# Patient Record
Sex: Female | Born: 1945 | Race: White | Hispanic: No | Marital: Married | Smoking: Former smoker
Health system: Southern US, Community
[De-identification: ages and names within clinical notes are randomized; demographics above are authoritative.]

## PROBLEM LIST (undated history)

## (undated) DIAGNOSIS — R911 Solitary pulmonary nodule: Secondary | ICD-10-CM

## (undated) DIAGNOSIS — H269 Unspecified cataract: Secondary | ICD-10-CM

## (undated) DIAGNOSIS — T7840XA Allergy, unspecified, initial encounter: Secondary | ICD-10-CM

## (undated) DIAGNOSIS — J479 Bronchiectasis, uncomplicated: Secondary | ICD-10-CM

## (undated) DIAGNOSIS — C801 Malignant (primary) neoplasm, unspecified: Secondary | ICD-10-CM

## (undated) DIAGNOSIS — T4145XA Adverse effect of unspecified anesthetic, initial encounter: Secondary | ICD-10-CM

## (undated) DIAGNOSIS — M1990 Unspecified osteoarthritis, unspecified site: Secondary | ICD-10-CM

## (undated) DIAGNOSIS — K649 Unspecified hemorrhoids: Secondary | ICD-10-CM

## (undated) DIAGNOSIS — M75102 Unspecified rotator cuff tear or rupture of left shoulder, not specified as traumatic: Secondary | ICD-10-CM

## (undated) DIAGNOSIS — T8859XA Other complications of anesthesia, initial encounter: Secondary | ICD-10-CM

## (undated) DIAGNOSIS — J189 Pneumonia, unspecified organism: Secondary | ICD-10-CM

## (undated) DIAGNOSIS — D649 Anemia, unspecified: Secondary | ICD-10-CM

## (undated) DIAGNOSIS — G4730 Sleep apnea, unspecified: Secondary | ICD-10-CM

## (undated) DIAGNOSIS — R112 Nausea with vomiting, unspecified: Secondary | ICD-10-CM

## (undated) DIAGNOSIS — Z9889 Other specified postprocedural states: Secondary | ICD-10-CM

## (undated) DIAGNOSIS — I471 Supraventricular tachycardia, unspecified: Secondary | ICD-10-CM

## (undated) DIAGNOSIS — G4733 Obstructive sleep apnea (adult) (pediatric): Secondary | ICD-10-CM

## (undated) DIAGNOSIS — H409 Unspecified glaucoma: Secondary | ICD-10-CM

## (undated) DIAGNOSIS — E7800 Pure hypercholesterolemia, unspecified: Secondary | ICD-10-CM

## (undated) DIAGNOSIS — IMO0001 Reserved for inherently not codable concepts without codable children: Secondary | ICD-10-CM

## (undated) DIAGNOSIS — I720 Aneurysm of carotid artery: Secondary | ICD-10-CM

## (undated) DIAGNOSIS — Z9989 Dependence on other enabling machines and devices: Secondary | ICD-10-CM

## (undated) HISTORY — DX: Other specified postprocedural states: Z98.890

## (undated) HISTORY — DX: Allergy, unspecified, initial encounter: T78.40XA

## (undated) HISTORY — DX: Obstructive sleep apnea (adult) (pediatric): G47.33

## (undated) HISTORY — DX: Dependence on other enabling machines and devices: Z99.89

## (undated) HISTORY — DX: Unspecified glaucoma: H40.9

## (undated) HISTORY — PX: EYE SURGERY: SHX253

## (undated) HISTORY — PX: BUNIONECTOMY: SHX129

## (undated) HISTORY — DX: Pure hypercholesterolemia, unspecified: E78.00

## (undated) HISTORY — DX: Unspecified cataract: H26.9

## (undated) HISTORY — PX: NASAL SEPTUM SURGERY: SHX37

## (undated) HISTORY — DX: Reserved for inherently not codable concepts without codable children: IMO0001

## (undated) HISTORY — DX: Unspecified hemorrhoids: K64.9

## (undated) HISTORY — PX: NASAL SINUS SURGERY: SHX719

## (undated) HISTORY — DX: Sleep apnea, unspecified: G47.30

## (undated) HISTORY — DX: Malignant (primary) neoplasm, unspecified: C80.1

## (undated) HISTORY — PX: OTHER SURGICAL HISTORY: SHX169

---

## 1980-01-17 HISTORY — PX: TUBAL LIGATION: SHX77

## 2000-08-10 ENCOUNTER — Other Ambulatory Visit: Admission: RE | Admit: 2000-08-10 | Discharge: 2000-08-10 | Payer: Self-pay | Admitting: Obstetrics & Gynecology

## 2001-01-16 DIAGNOSIS — Z9889 Other specified postprocedural states: Secondary | ICD-10-CM

## 2001-01-16 HISTORY — DX: Other specified postprocedural states: Z98.890

## 2001-08-30 ENCOUNTER — Other Ambulatory Visit: Admission: RE | Admit: 2001-08-30 | Discharge: 2001-08-30 | Payer: Self-pay | Admitting: Obstetrics & Gynecology

## 2002-08-27 ENCOUNTER — Other Ambulatory Visit: Admission: RE | Admit: 2002-08-27 | Discharge: 2002-08-27 | Payer: Self-pay | Admitting: Obstetrics & Gynecology

## 2003-09-15 ENCOUNTER — Other Ambulatory Visit: Admission: RE | Admit: 2003-09-15 | Discharge: 2003-09-15 | Payer: Self-pay | Admitting: Obstetrics & Gynecology

## 2004-09-16 ENCOUNTER — Other Ambulatory Visit: Admission: RE | Admit: 2004-09-16 | Discharge: 2004-09-16 | Payer: Self-pay | Admitting: Obstetrics & Gynecology

## 2005-01-16 DIAGNOSIS — G4733 Obstructive sleep apnea (adult) (pediatric): Secondary | ICD-10-CM

## 2005-01-16 HISTORY — DX: Obstructive sleep apnea (adult) (pediatric): G47.33

## 2006-01-16 DIAGNOSIS — IMO0001 Reserved for inherently not codable concepts without codable children: Secondary | ICD-10-CM

## 2006-01-16 HISTORY — DX: Reserved for inherently not codable concepts without codable children: IMO0001

## 2007-10-15 ENCOUNTER — Ambulatory Visit: Payer: Self-pay | Admitting: Internal Medicine

## 2008-04-21 ENCOUNTER — Ambulatory Visit: Payer: Self-pay | Admitting: Internal Medicine

## 2009-01-16 LAB — HM PAP SMEAR: HM Pap smear: NORMAL

## 2009-11-16 LAB — HM COLONOSCOPY

## 2010-01-16 HISTORY — PX: COLONOSCOPY: SHX174

## 2010-10-19 ENCOUNTER — Other Ambulatory Visit: Payer: Self-pay

## 2010-10-25 ENCOUNTER — Encounter: Payer: Self-pay | Admitting: Internal Medicine

## 2010-10-26 ENCOUNTER — Ambulatory Visit (INDEPENDENT_AMBULATORY_CARE_PROVIDER_SITE_OTHER): Payer: BC Managed Care – PPO | Admitting: Internal Medicine

## 2010-10-26 ENCOUNTER — Encounter: Payer: Self-pay | Admitting: Internal Medicine

## 2010-10-26 DIAGNOSIS — R5383 Other fatigue: Secondary | ICD-10-CM

## 2010-10-26 DIAGNOSIS — Z23 Encounter for immunization: Secondary | ICD-10-CM

## 2010-10-26 DIAGNOSIS — Z13220 Encounter for screening for lipoid disorders: Secondary | ICD-10-CM

## 2010-10-26 DIAGNOSIS — R5381 Other malaise: Secondary | ICD-10-CM

## 2010-10-26 LAB — CBC WITH DIFFERENTIAL/PLATELET
Eosinophils Relative: 2.8 % (ref 0.0–5.0)
HCT: 40.5 % (ref 36.0–46.0)
Hemoglobin: 13.5 g/dL (ref 12.0–15.0)
Lymphs Abs: 1.6 10*3/uL (ref 0.7–4.0)
Monocytes Relative: 7.4 % (ref 3.0–12.0)
Platelets: 172 10*3/uL (ref 150.0–400.0)
WBC: 4.8 10*3/uL (ref 4.5–10.5)

## 2010-10-26 LAB — TSH: TSH: 1.08 u[IU]/mL (ref 0.35–5.50)

## 2010-10-26 LAB — LIPID PANEL
Cholesterol: 232 mg/dL — ABNORMAL HIGH (ref 0–200)
HDL: 56.7 mg/dL (ref >39.00)
Triglycerides: 103 mg/dL (ref 0.0–149.0)

## 2010-10-26 LAB — COMPREHENSIVE METABOLIC PANEL
Albumin: 4.8 g/dL (ref 3.5–5.2)
Alkaline Phosphatase: 66 U/L (ref 39–117)
BUN: 15 mg/dL (ref 6–23)
Glucose, Bld: 99 mg/dL (ref 70–99)
Total Bilirubin: 0.9 mg/dL (ref 0.3–1.2)

## 2010-10-26 LAB — LDL CHOLESTEROL, DIRECT: Direct LDL: 155.9 mg/dL

## 2010-10-26 NOTE — Progress Notes (Signed)
  Subjective:    Patient ID: Dawn Wolfe, female    DOB: [birth year], 65 y.o.   MRN: [id number]  HPI    Review of Systems     Objective:   Physical Exam        Assessment & Plan:   Subjective:    Dawn Wolfe is a 65 y.o. female who presents for an annual exam. The patient has no complaints today. The patient is sexually active. GYN screening history: last pap: was normal and approximate date 2010 and was normal. The patient wears seatbelts: yes. The patient participates in regular exercise: no. Has the patient ever been transfused or tattooed?: no. The patient reports that there is not domestic violence in her life.   Menstrual History: OB History    Grav Para Term Preterm Abortions TAB SAB Ect Mult Living                  Menarche age: 4 No LMP recorded. Patient is postmenopausal.    The following portions of the patient's history were reviewed and updated as appropriate: allergies, current medications, past family history, past medical history, past social history, past surgical history and problem list.  Review of Systems A comprehensive review of systems was negative.    Objective:    BP 122/78  Pulse 79  Temp(Src) 98.1 F (36.7 C) (Oral)  Resp 16  Ht 5' 6.5" (1.689 m)  Wt 156 lb 12 oz (71.101 kg)  BMI 24.92 kg/m2  SpO2 98% General appearance: alert, cooperative and appears stated age Head: Normocephalic, without obvious abnormality, atraumatic Eyes: conjunctivae/corneas clear. PERRL, EOM's intact. Fundi benign. Ears: normal TM's and external ear canals both ears Nose: Nares normal. Septum midline. Mucosa normal. No drainage or sinus tenderness. Throat: lips, mucosa, and tongue normal; teeth and gums normal Neck: no adenopathy, no carotid bruit, no JVD, supple, symmetrical, trachea midline and thyroid not enlarged, symmetric, no tenderness/mass/nodules Back: symmetric, no curvature. ROM normal. No CVA tenderness. Lungs: clear to auscultation  bilaterally Heart: regular rate and rhythm, S1, S2 normal, no murmur, click, rub or gallop Abdomen: soft, non-tender; bowel sounds normal; no masses,  no organomegaly Extremities: extremities normal, atraumatic, no cyanosis or edema Pulses: 2+ and symmetric Skin: Skin color, texture, turgor normal. No rashes or lesions Lymph nodes: Cervical, supraclavicular, and axillary nodes normal. Neurologic: Grossly normal.    Assessment:    Healthy female exam.    Plan:     Breast self exam technique reviewed and patient encouraged to perform self-exam monthly. Dietary diary. Discussed healthy lifestyle modifications.

## 2010-10-29 ENCOUNTER — Encounter: Payer: Self-pay | Admitting: Internal Medicine

## 2010-11-10 ENCOUNTER — Other Ambulatory Visit (INDEPENDENT_AMBULATORY_CARE_PROVIDER_SITE_OTHER): Payer: BC Managed Care – PPO | Admitting: *Deleted

## 2010-11-10 DIAGNOSIS — Z79899 Other long term (current) drug therapy: Secondary | ICD-10-CM

## 2010-11-10 LAB — HEPATIC FUNCTION PANEL
AST: 39 U/L — ABNORMAL HIGH (ref 0–37)
Albumin: 4.5 g/dL (ref 3.5–5.2)
Alkaline Phosphatase: 68 U/L (ref 39–117)
Bilirubin, Direct: 0 mg/dL (ref 0.0–0.3)
Total Bilirubin: 0.8 mg/dL (ref 0.3–1.2)

## 2010-11-11 ENCOUNTER — Other Ambulatory Visit: Payer: BC Managed Care – PPO

## 2011-01-26 ENCOUNTER — Telehealth: Payer: Self-pay | Admitting: Internal Medicine

## 2011-01-26 NOTE — Telephone Encounter (Signed)
Patient has a lot of congestion in her lungs and mucous in her head wants to be seen Friday 1.11.13.

## 2011-01-26 NOTE — Telephone Encounter (Signed)
Ok to do, add her on in aM ealry or on/after 3 pm

## 2011-01-27 ENCOUNTER — Encounter: Payer: Self-pay | Admitting: Internal Medicine

## 2011-01-27 ENCOUNTER — Ambulatory Visit (INDEPENDENT_AMBULATORY_CARE_PROVIDER_SITE_OTHER): Payer: BC Managed Care – PPO | Admitting: Internal Medicine

## 2011-01-27 VITALS — BP 120/78 | HR 76 | Temp 97.9°F | Resp 14 | Wt 157.5 lb

## 2011-01-27 DIAGNOSIS — J40 Bronchitis, not specified as acute or chronic: Secondary | ICD-10-CM

## 2011-01-27 DIAGNOSIS — J398 Other specified diseases of upper respiratory tract: Secondary | ICD-10-CM

## 2011-01-27 DIAGNOSIS — J399 Disease of upper respiratory tract, unspecified: Secondary | ICD-10-CM

## 2011-01-27 MED ORDER — DOXYCYCLINE HYCLATE 100 MG PO CAPS: 100.0000 mg | ORAL_CAPSULE | Freq: Two times a day (BID) | ORAL | Status: AC

## 2011-01-27 NOTE — Progress Notes (Signed)
  Subjective:    Patient ID: Dawn Wolfe, female    DOB: 11-26-1945, 66 y.o.   MRN: [id number]  HPI  66 yo wf with ni significatn PMH presents with productive cough, malaise and sinus congestion for the past 48 hours.  History of a URI for 10 days around Christmas,  With spontaneous resolution, followed by return of symptoms 2 days ago when she woke up with similar symptoms   No high fevers,  No intense myalgias , Congested but still using cpap.    Past Medical History  Diagnosis Date  . Obstructive sleep apnea on CPAP 2007  . History of colonoscopy 2003    Normal, Dr. Sherin Quarry, [place]   . Screening for breast cancer sept 2012    done annually in Sept   . Normal exercise sestamibi stress test 2008    Mariel Kansky   Current Outpatient Prescriptions on File Prior to Visit  Medication Sig Dispense Refill  . aspirin 81 MG tablet Take 81 mg by mouth daily.        . Calcium Carbonate-Vitamin D (CALCIUM + D) 600-200 MG-UNIT TABS Take by mouth.        . fish oil-omega-3 fatty acids 1000 MG capsule Take 2 g by mouth daily.        . fluticasone (FLONASE) 50 MCG/ACT nasal spray Place 2 sprays into the nose daily.        . Multiple Vitamin (MULTIVITAMIN) tablet Take 1 tablet by mouth daily.        . niacin 500 MG tablet Take 500 mg by mouth 2 (two) times daily with a meal.          Review of Systems  Constitutional: Negative for fever, chills and unexpected weight change.  HENT: Positive for congestion, rhinorrhea, postnasal drip and sinus pressure. Negative for hearing loss, ear pain, nosebleeds, sore throat, facial swelling, sneezing, mouth sores, trouble swallowing, neck pain, neck stiffness, voice change, tinnitus and ear discharge.   Eyes: Negative for pain, discharge, redness and visual disturbance.  Respiratory: Positive for cough. Negative for chest tightness, shortness of breath, wheezing and stridor.   Cardiovascular: Negative for chest pain, palpitations and leg swelling.    Musculoskeletal: Negative for myalgias and arthralgias.  Skin: Negative for color change and rash.  Neurological: Negative for dizziness, weakness, light-headedness and headaches.  Hematological: Negative for adenopathy.       Objective:   Physical Exam  Constitutional: She is oriented to person, place, and time. She appears well-developed and well-nourished.  HENT:  Mouth/Throat: Oropharynx is clear and moist.  Eyes: EOM are normal. Pupils are equal, round, and reactive to light. No scleral icterus.  Neck: Normal range of motion. Neck supple. No JVD present. No thyromegaly present.  Cardiovascular: Normal rate, regular rhythm, normal heart sounds and intact distal pulses.   Pulmonary/Chest: Effort normal and breath sounds normal.  Musculoskeletal: Normal range of motion. She exhibits no edema.  Lymphadenopathy:    She has no cervical adenopathy.  Neurological: She is alert and oriented to person, place, and time.  Skin: Skin is warm and dry.  Psychiatric: She has a normal mood and affect.          Assessment & Plan:

## 2011-01-27 NOTE — Telephone Encounter (Signed)
Pt came in for appt. 

## 2011-01-29 ENCOUNTER — Encounter: Payer: Self-pay | Admitting: Internal Medicine

## 2011-01-29 NOTE — Assessment & Plan Note (Signed)
Her second one in less than one month.  Will treat with doxycycline x 7 days.  No wheezing on exam so avoiding steroids.

## 2011-10-09 ENCOUNTER — Telehealth: Payer: Self-pay | Admitting: Internal Medicine

## 2011-10-09 NOTE — Telephone Encounter (Signed)
Pt called wanted to know if she could change from dr Darrick Huntsman to dr scott.  Her husband goes to dr Lorin Picket and they wanted to have the same doctor Is this ok with both of you

## 2011-10-09 NOTE — Telephone Encounter (Signed)
Ok with me if ok with dr. Lorin Picket

## 2011-10-10 NOTE — Telephone Encounter (Signed)
Ok with me if ok with Dr Darrick Huntsman.

## 2011-10-16 ENCOUNTER — Ambulatory Visit (INDEPENDENT_AMBULATORY_CARE_PROVIDER_SITE_OTHER): Payer: Medicare Other | Admitting: Internal Medicine

## 2011-10-16 DIAGNOSIS — Z23 Encounter for immunization: Secondary | ICD-10-CM

## 2011-10-16 NOTE — Progress Notes (Signed)
Patient ID: Dawn Wolfe, female   DOB: 02-11-1945, 66 y.o.   MRN: [id number] patient is here for her annual flu shot.

## 2011-10-20 ENCOUNTER — Encounter (HOSPITAL_BASED_OUTPATIENT_CLINIC_OR_DEPARTMENT_OTHER): Payer: Self-pay | Admitting: *Deleted

## 2011-10-20 ENCOUNTER — Other Ambulatory Visit: Payer: Self-pay | Admitting: Orthopedic Surgery

## 2011-10-20 NOTE — Progress Notes (Signed)
NPO AFTER MN.  ARRIVES AT 0915. NEEDS HG. 

## 2011-10-20 NOTE — Telephone Encounter (Signed)
Appointment 11/15/11 pt aware of appointment

## 2011-10-25 ENCOUNTER — Ambulatory Visit (HOSPITAL_BASED_OUTPATIENT_CLINIC_OR_DEPARTMENT_OTHER): Payer: Medicare Other | Admitting: Anesthesiology

## 2011-10-25 ENCOUNTER — Encounter (HOSPITAL_BASED_OUTPATIENT_CLINIC_OR_DEPARTMENT_OTHER)
Admission: RE | Disposition: A | Discharge status: Home / Self Care | Payer: Self-pay | Source: Ambulatory Visit | Attending: Orthopedic Surgery

## 2011-10-25 ENCOUNTER — Encounter (HOSPITAL_BASED_OUTPATIENT_CLINIC_OR_DEPARTMENT_OTHER): Payer: Self-pay | Admitting: Anesthesiology

## 2011-10-25 ENCOUNTER — Encounter (HOSPITAL_BASED_OUTPATIENT_CLINIC_OR_DEPARTMENT_OTHER): Payer: Self-pay

## 2011-10-25 ENCOUNTER — Ambulatory Visit (HOSPITAL_BASED_OUTPATIENT_CLINIC_OR_DEPARTMENT_OTHER)
Admission: RE | Admit: 2011-10-25 | Discharge: 2011-10-25 | Disposition: A | Discharge status: Home / Self Care | Payer: Medicare Other | Source: Ambulatory Visit | Attending: Orthopedic Surgery | Admitting: Orthopedic Surgery

## 2011-10-25 DIAGNOSIS — X58XXXA Exposure to other specified factors, initial encounter: Secondary | ICD-10-CM | POA: Insufficient documentation

## 2011-10-25 DIAGNOSIS — S43429A Sprain of unspecified rotator cuff capsule, initial encounter: Secondary | ICD-10-CM | POA: Insufficient documentation

## 2011-10-25 DIAGNOSIS — M25819 Other specified joint disorders, unspecified shoulder: Secondary | ICD-10-CM | POA: Insufficient documentation

## 2011-10-25 DIAGNOSIS — G4733 Obstructive sleep apnea (adult) (pediatric): Secondary | ICD-10-CM | POA: Insufficient documentation

## 2011-10-25 DIAGNOSIS — M24119 Other articular cartilage disorders, unspecified shoulder: Secondary | ICD-10-CM | POA: Insufficient documentation

## 2011-10-25 DIAGNOSIS — M75100 Unspecified rotator cuff tear or rupture of unspecified shoulder, not specified as traumatic: Secondary | ICD-10-CM

## 2011-10-25 HISTORY — PX: SHOULDER ARTHROSCOPY: SHX128

## 2011-10-25 HISTORY — DX: Unspecified rotator cuff tear or rupture of left shoulder, not specified as traumatic: M75.102

## 2011-10-25 LAB — POCT HEMOGLOBIN-HEMACUE: Hemoglobin: 14.2 g/dL (ref 12.0–15.0)

## 2011-10-25 SURGERY — ARTHROSCOPY, SHOULDER
Anesthesia: General | Site: Shoulder | Laterality: Left | Wound class: Clean

## 2011-10-25 MED ORDER — PROMETHAZINE HCL 50 MG PO TABS: 50.0000 mg | ORAL_TABLET | Freq: Four times a day (QID) | ORAL | Status: DC | PRN

## 2011-10-25 MED ORDER — ONDANSETRON HCL 4 MG/2ML IJ SOLN
4.0000 mg | Freq: Once | INTRAMUSCULAR | Status: AC
  Administered 2011-10-25: 4 mg via INTRAVENOUS

## 2011-10-25 MED ORDER — PROMETHAZINE HCL 25 MG/ML IJ SOLN
INTRAMUSCULAR | Status: DC | PRN
  Administered 2011-10-25: 12.5 mg via INTRAVENOUS

## 2011-10-25 MED ORDER — LACTATED RINGERS IV SOLN
INTRAVENOUS | Status: DC | PRN
  Administered 2011-10-25 (×2): via INTRAVENOUS

## 2011-10-25 MED ORDER — SODIUM CHLORIDE 0.9 % IR SOLN
Status: DC | PRN
  Administered 2011-10-25: 6000 mL

## 2011-10-25 MED ORDER — MIDAZOLAM HCL 5 MG/5ML IJ SOLN
INTRAMUSCULAR | Status: DC | PRN
  Administered 2011-10-25: 2 mg via INTRAVENOUS

## 2011-10-25 MED ORDER — KETOROLAC TROMETHAMINE 30 MG/ML IJ SOLN: 15.0000 mg | Freq: Once | INTRAMUSCULAR | Status: DC | PRN

## 2011-10-25 MED ORDER — POVIDONE-IODINE 7.5 % EX SOLN: Freq: Once | CUTANEOUS | Status: DC

## 2011-10-25 MED ORDER — LIDOCAINE HCL (CARDIAC) 20 MG/ML IV SOLN
INTRAVENOUS | Status: DC | PRN
  Administered 2011-10-25: 75 mg via INTRAVENOUS

## 2011-10-25 MED ORDER — EPHEDRINE SULFATE 50 MG/ML IJ SOLN
INTRAMUSCULAR | Status: DC | PRN
  Administered 2011-10-25 (×2): 10 mg via INTRAVENOUS

## 2011-10-25 MED ORDER — MIDAZOLAM HCL 2 MG/2ML IJ SOLN
2.0000 mg | Freq: Once | INTRAMUSCULAR | Status: AC
  Administered 2011-10-25: 2 mg via INTRAVENOUS

## 2011-10-25 MED ORDER — SCOPOLAMINE 1 MG/3DAYS TD PT72
1.0000 | MEDICATED_PATCH | TRANSDERMAL | Status: DC
Start: 2011-10-25 — End: 2011-10-25
  Administered 2011-10-25: 1.5 mg via TRANSDERMAL

## 2011-10-25 MED ORDER — ROCURONIUM BROMIDE 100 MG/10ML IV SOLN
INTRAVENOUS | Status: DC | PRN
  Administered 2011-10-25: 30 mg via INTRAVENOUS

## 2011-10-25 MED ORDER — BUPIVACAINE-EPINEPHRINE 0.5% -1:200000 IJ SOLN
INTRAMUSCULAR | Status: DC | PRN
  Administered 2011-10-25: 5 mL

## 2011-10-25 MED ORDER — STERILE WATER FOR IRRIGATION IR SOLN
Status: DC | PRN
  Administered 2011-10-25: 1

## 2011-10-25 MED ORDER — SUCCINYLCHOLINE CHLORIDE 20 MG/ML IJ SOLN
INTRAMUSCULAR | Status: DC | PRN
  Administered 2011-10-25: 120 mg via INTRAVENOUS

## 2011-10-25 MED ORDER — SODIUM CHLORIDE 0.9 % IR SOLN
Status: DC | PRN
  Administered 2011-10-25: 12:00:00

## 2011-10-25 MED ORDER — PROMETHAZINE HCL 25 MG/ML IJ SOLN: 6.2500 mg | INTRAMUSCULAR | Status: DC | PRN

## 2011-10-25 MED ORDER — LACTATED RINGERS IV SOLN
INTRAVENOUS | Status: DC
  Administered 2011-10-25 (×2): via INTRAVENOUS

## 2011-10-25 MED ORDER — DEXAMETHASONE SODIUM PHOSPHATE 4 MG/ML IJ SOLN
INTRAMUSCULAR | Status: DC | PRN
  Administered 2011-10-25: 10 mg via INTRAVENOUS

## 2011-10-25 MED ORDER — FENTANYL CITRATE 0.05 MG/ML IJ SOLN: 25.0000 ug | INTRAMUSCULAR | Status: DC | PRN

## 2011-10-25 MED ORDER — ROPIVACAINE HCL 5 MG/ML IJ SOLN
INTRAMUSCULAR | Status: DC | PRN
  Administered 2011-10-25: 20 mL

## 2011-10-25 MED ORDER — FENTANYL CITRATE 0.05 MG/ML IJ SOLN
INTRAMUSCULAR | Status: DC | PRN
  Administered 2011-10-25: 100 ug via INTRAVENOUS

## 2011-10-25 MED ORDER — ONDANSETRON HCL 4 MG/2ML IJ SOLN
INTRAMUSCULAR | Status: DC | PRN
  Administered 2011-10-25: 4 mg via INTRAVENOUS

## 2011-10-25 MED ORDER — METOCLOPRAMIDE HCL 5 MG/ML IJ SOLN
INTRAMUSCULAR | Status: DC | PRN
  Administered 2011-10-25 (×2): 10 mg via INTRAVENOUS

## 2011-10-25 MED ORDER — NEOSTIGMINE METHYLSULFATE 1 MG/ML IJ SOLN
INTRAMUSCULAR | Status: DC | PRN
  Administered 2011-10-25: 4 mg via INTRAVENOUS

## 2011-10-25 MED ORDER — FENTANYL CITRATE 0.05 MG/ML IJ SOLN
50.0000 ug | Freq: Two times a day (BID) | INTRAMUSCULAR | Status: DC
  Administered 2011-10-25 (×2): 50 ug via INTRAVENOUS

## 2011-10-25 MED ORDER — PROPOFOL 10 MG/ML IV BOLUS
INTRAVENOUS | Status: DC | PRN
  Administered 2011-10-25: 100 mg via INTRAVENOUS

## 2011-10-25 MED ORDER — GLYCOPYRROLATE 0.2 MG/ML IJ SOLN
INTRAMUSCULAR | Status: DC | PRN
  Administered 2011-10-25: 0.4 mg via INTRAVENOUS

## 2011-10-25 SURGICAL SUPPLY — 75 items
APL SKNCLS STERI-STRIP NONHPOA (GAUZE/BANDAGES/DRESSINGS)
BENZOIN TINCTURE PRP APPL 2/3 (GAUZE/BANDAGES/DRESSINGS) IMPLANT
BLADE 4.2CUDA (BLADE) ×2 IMPLANT
BLADE CUDA 4.2 (BLADE) IMPLANT
BLADE CUDA 5.5 (BLADE) IMPLANT
BLADE CUDA SHAVER 3.5 (BLADE) IMPLANT
BLADE CUTTER GATOR 3.5 (BLADE) IMPLANT
BLADE FLAT COURSE (BLADE) IMPLANT
BLADE GREAT WHITE 4.2 (BLADE) IMPLANT
BLADE SURG 10 STRL SS (BLADE) ×2 IMPLANT
BLADE SURG 15 STRL LF DISP TIS (BLADE) ×1 IMPLANT
BLADE SURG 15 STRL SS (BLADE)
BUR OVAL 4.0 (BURR) ×2 IMPLANT
CANISTER SUCT LVC 12 LTR MEDI- (MISCELLANEOUS) ×3 IMPLANT
CANISTER SUCTION 1200CC (MISCELLANEOUS) ×2 IMPLANT
CLOTH BEACON ORANGE TIMEOUT ST (SAFETY) ×2 IMPLANT
DRAPE LG THREE QUARTER DISP (DRAPES) ×4 IMPLANT
DRAPE SHOULDER BEACH CHAIR (DRAPES) ×2 IMPLANT
DRAPE U-SHAPE 47X51 STRL (DRAPES) ×2 IMPLANT
DRSG ADAPTIC 3X8 NADH LF (GAUZE/BANDAGES/DRESSINGS) ×2 IMPLANT
DRSG EMULSION OIL 3X3 NADH (GAUZE/BANDAGES/DRESSINGS) ×1 IMPLANT
DRSG PAD ABDOMINAL 8X10 ST (GAUZE/BANDAGES/DRESSINGS) ×2 IMPLANT
DURAPREP 26ML APPLICATOR (WOUND CARE) ×3 IMPLANT
ELECT MENISCUS 165MM 90D (ELECTRODE) IMPLANT
ELECT REM PT RETURN 9FT ADLT (ELECTROSURGICAL)
ELECTRODE REM PT RTRN 9FT ADLT (ELECTROSURGICAL) ×1 IMPLANT
GLOVE BIOGEL M STER SZ 6 (GLOVE) ×1 IMPLANT
GLOVE BIOGEL PI IND STRL 8 (GLOVE) ×1 IMPLANT
GLOVE BIOGEL PI INDICATOR 8 (GLOVE) ×1
GLOVE ECLIPSE 6.0 STRL STRAW (GLOVE) ×1 IMPLANT
GLOVE ECLIPSE 8.0 STRL XLNG CF (GLOVE) ×4 IMPLANT
GLOVE INDICATOR 8.0 STRL GRN (GLOVE) ×4 IMPLANT
GOWN PREVENTION PLUS LG XLONG (DISPOSABLE) ×2 IMPLANT
GOWN STRL REIN XL XLG (GOWN DISPOSABLE) ×4 IMPLANT
IV NS IRRIG 3000ML ARTHROMATIC (IV SOLUTION) ×4 IMPLANT
NDL 1/2 CIR CATGUT .05X1.09 (NEEDLE) IMPLANT
NDL HYPO 18GX1.5 BLUNT FILL (NEEDLE) ×1 IMPLANT
NDL SAFETY ECLIPSE 18X1.5 (NEEDLE) ×1 IMPLANT
NEEDLE 1/2 CIR CATGUT .05X1.09 (NEEDLE) IMPLANT
NEEDLE HYPO 18GX1.5 BLUNT FILL (NEEDLE) ×2 IMPLANT
NEEDLE HYPO 18GX1.5 SHARP (NEEDLE) ×2
NEEDLE HYPO 22GX1.5 SAFETY (NEEDLE) ×1 IMPLANT
NS IRRIG 500ML POUR BTL (IV SOLUTION) ×1 IMPLANT
PACK ARTHROSCOPY DSU (CUSTOM PROCEDURE TRAY) ×2 IMPLANT
PACK BASIN DAY SURGERY FS (CUSTOM PROCEDURE TRAY) ×2 IMPLANT
PENCIL BUTTON HOLSTER BLD 10FT (ELECTRODE) IMPLANT
SET ARTHROSCOPY TUBING (MISCELLANEOUS) ×2
SET ARTHROSCOPY TUBING LN (MISCELLANEOUS) ×1 IMPLANT
SLING ARM FOAM STRAP MED (SOFTGOODS) ×2 IMPLANT
SLING ARM IMMOBILIZER LRG (SOFTGOODS) IMPLANT
SPONGE GAUZE 4X4 12PLY (GAUZE/BANDAGES/DRESSINGS) ×2 IMPLANT
SPONGE SURGIFOAM ABS GEL 100 (HEMOSTASIS) IMPLANT
STAPLER VISISTAT 35W (STAPLE) ×2 IMPLANT
STRIP CLOSURE SKIN 1/2X4 (GAUZE/BANDAGES/DRESSINGS) IMPLANT
SUCTION FRAZIER TIP 10 FR DISP (SUCTIONS) ×2 IMPLANT
SUT BONE WAX W31G (SUTURE) IMPLANT
SUT ETHIBOND GREEN BRAID 0S 4 (SUTURE) IMPLANT
SUT ETHIBOND NAB CT1 #1 30IN (SUTURE) IMPLANT
SUT ETHILON 4 0 PS 2 18 (SUTURE) ×1 IMPLANT
SUT VIC AB 0 CT1 36 (SUTURE) IMPLANT
SUT VIC AB 1 CT1 36 (SUTURE) ×2 IMPLANT
SUT VIC AB 2-0 CT1 27 (SUTURE)
SUT VIC AB 2-0 CT1 TAPERPNT 27 (SUTURE) ×2 IMPLANT
SUT VIC AB 3-0 CT1 27 (SUTURE)
SUT VIC AB 3-0 CT1 TAPERPNT 27 (SUTURE) IMPLANT
SUT VIC AB 3-0 SH 27 (SUTURE)
SUT VIC AB 3-0 SH 27X BRD (SUTURE) IMPLANT
SUT VICRYL 4-0 PS2 18IN ABS (SUTURE) IMPLANT
SYR BULB IRRIGATION 50ML (SYRINGE) ×2 IMPLANT
SYRINGE 10CC LL (SYRINGE) ×2 IMPLANT
TAPE HYPAFIX 6X30 (GAUZE/BANDAGES/DRESSINGS) ×1 IMPLANT
TOWEL OR 17X24 6PK STRL BLUE (TOWEL DISPOSABLE) ×2 IMPLANT
TUBE CONNECTING 12X1/4 (SUCTIONS) ×2 IMPLANT
WAND 90 DEG TURBOVAC W/CORD (SURGICAL WAND) ×2 IMPLANT
WATER STERILE IRR 500ML POUR (IV SOLUTION) ×2 IMPLANT

## 2011-10-25 NOTE — Anesthesia Postprocedure Evaluation (Signed)
  Anesthesia Post-op Note  Patient: Dawn Wolfe  Procedure(s) Performed: Procedure(s) (LRB): ARTHROSCOPY SHOULDER (Left)  Patient Location: PACU  Anesthesia Type: GA combined with regional for post-op pain  Level of Consciousness: awake and alert   Airway and Oxygen Therapy: Patient Spontanous Breathing  Post-op Pain: mild  Post-op Assessment: Post-op Vital signs reviewed, Patient's Cardiovascular Status Stable, Respiratory Function Stable, Patent Airway and No signs of Nausea or vomiting  Post-op Vital Signs: stable  Complications: No apparent anesthesia complications

## 2011-10-25 NOTE — Anesthesia Preprocedure Evaluation (Signed)
Anesthesia Evaluation  Patient identified by MRN, date of birth, ID band Patient awake    Reviewed: Allergy & Precautions, H&P , NPO status , Patient's Chart, lab work & pertinent test results  Airway Mallampati: II TM Distance: <3 FB Neck ROM: Full    Dental No notable dental hx.    Pulmonary sleep apnea ,  breath sounds clear to auscultation  Pulmonary exam normal       Cardiovascular negative cardio ROS  Rhythm:Regular Rate:Normal     Neuro/Psych negative neurological ROS  negative psych ROS   GI/Hepatic negative GI ROS, Neg liver ROS,   Endo/Other  negative endocrine ROS  Renal/GU negative Renal ROS  negative genitourinary   Musculoskeletal negative musculoskeletal ROS (+)   Abdominal   Peds negative pediatric ROS (+)  Hematology negative hematology ROS (+)   Anesthesia Other Findings   Reproductive/Obstetrics negative OB ROS                           Anesthesia Physical Anesthesia Plan  ASA: II  Anesthesia Plan: General   Post-op Pain Management:    Induction: Intravenous  Airway Management Planned: Oral ETT  Additional Equipment:   Intra-op Plan:   Post-operative Plan: Extubation in OR  Informed Consent: I have reviewed the patients History and Physical, chart, labs and discussed the procedure including the risks, benefits and alternatives for the proposed anesthesia with the patient or authorized representative who has indicated his/her understanding and acceptance.   Dental advisory given  Plan Discussed with: CRNA and Surgeon  Anesthesia Plan Comments:         Anesthesia Quick Evaluation

## 2011-10-25 NOTE — Brief Op Note (Signed)
10/25/2011  1:21 PM  PATIENT:  Dawn Wolfe  66 y.o. female  PRE-OPERATIVE DIAGNOSIS:  PARTIAL ROTATOR CUFF TEAR OF THE LEFT SHOULDER  POST-OPERATIVE DIAGNOSIS:  PARTIAL ROTATOR CUFF TEAR OF THE LEFT SHOULDER  AND LABRAL TEAR  PROCEDURE:  Procedure(s) (LRB) with comments: ARTHROSCOPY SHOULDER (Left) - with SAD, shave of the labrium ALLEN OR [facility] FRAME GENERAL WITH BLOCK SLEEP APNEA  SURGEON:  Surgeon(s) and Role:    * Drucilla Schmidt, MD - Primary  PHYSICIAN ASSISTANT:   ASSISTANTS: Skip Mayer PAC  ANESTHESIA:   regional and general  EBL:  Total I/O In: 1000 [I.V.:1000] Out: -   BLOOD ADMINISTERED:none  DRAINS: none   LOCAL MEDICATIONS USED:  MARCAINE     SPECIMEN:  No Specimen  DISPOSITION OF SPECIMEN:  N/A  COUNTS:  YES  TOURNIQUET:  * No tourniquets in log *  DICTATION: .Other Dictation: Dictation Number [id number]  PLAN OF CARE: Discharge to home after PACU  PATIENT DISPOSITION:  PACU - hemodynamically stable.   Delay start of Pharmacological VTE agent (>24hrs) due to surgical blood loss or risk of bleeding: yes

## 2011-10-25 NOTE — Anesthesia Procedure Notes (Addendum)
Anesthesia Regional Block:  Interscalene brachial plexus block  Pre-Anesthetic Checklist: ,, timeout performed, Correct Patient, Correct Site, Correct Laterality, Correct Procedure, Correct Position, site marked, Risks and benefits discussed,  Surgical consent,  Pre-op evaluation,  At surgeon's request and post-op pain management   Prep: chloraprep       Needles:  Injection technique: Single-shot  Needle Type: Echogenic Needle          Additional Needles:  Procedures: ultrasound guided Interscalene brachial plexus block Narrative:  Injection made incrementally with aspirations every 5 mL.  Performed by: Personally  Anesthesiologist: Eilene Ghazi MD  Additional Notes: Patient tolerated the procedure well without complications  Interscalene brachial plexus block Procedure Name: Intubation Date/Time: 10/25/2011 12:24 PM Performed by: Jessica Priest Pre-anesthesia Checklist: Patient identified, Emergency Drugs available, Suction available and Patient being monitored Patient Re-evaluated:Patient Re-evaluated prior to inductionOxygen Delivery Method: Circle System Utilized Preoxygenation: Pre-oxygenation with 100% oxygen Intubation Type: IV induction, Rapid sequence and Cricoid Pressure applied Ventilation: Mask ventilation without difficulty Laryngoscope Size: Mac and 4 Grade View: Grade II Tube type: Oral Tube size: 7.0 mm Number of attempts: 1 Airway Equipment and Method: stylet and oral airway Placement Confirmation: ETT inserted through vocal cords under direct vision,  positive ETCO2 and breath sounds checked- equal and bilateral Secured at: 20 cm Tube secured with: Tape Dental Injury: Teeth and Oropharynx as per pre-operative assessment

## 2011-10-25 NOTE — Transfer of Care (Signed)
Immediate Anesthesia Transfer of Care Note  Patient: Dawn Wolfe  Procedure(s) Performed: Procedure(s) (LRB): ARTHROSCOPY SHOULDER (Left)  Patient Location: PACU  Anesthesia Type: General  Level of Consciousness: awake, sedated, patient cooperative and responds to stimulation  Airway & Oxygen Therapy: Patient Spontanous Breathing and Patient connected to face mask oxygen  Post-op Assessment: Report given to PACU RN, Post -op Vital signs reviewed and stable and Patient moving all extremities  Post vital signs: Reviewed and stable  Complications: No apparent anesthesia complications

## 2011-10-25 NOTE — H&P (Signed)
Dawn Wolfe is an 66 y.o. female.   Chief Complaint: painful lt shoulder HPI: MRI demonstrates interstitial tear rotator cuff  Past Medical History  Diagnosis Date  . Obstructive sleep apnea on CPAP 2007  . History of colonoscopy 2003    Normal, Dr. Sherin Quarry, [place]   . Normal exercise sestamibi stress test 2008    Ken Fath  . Rotator cuff tear, left     Past Surgical History  Procedure Date  . Abdominalplasty w/ liposuction AGE 51  . Nasal septum surgery AGE 43  . Nasal sinus surgery AGE 43  . Bunionectomy X3  LAST ONE 2003    Family History  Problem Relation Age of Onset  . Acute myelogenous leukemia Sister   . Cancer Sister 25    AML. in remission   Social History:  reports that she quit smoking about 33 years ago. Her smoking use included Cigarettes. She has a 20 pack-year smoking history. She has never used smokeless tobacco. She reports that she does not drink alcohol or use illicit drugs.  Allergies:  Allergies  Allergen Reactions  . Codeine Nausea And Vomiting    Medications Prior to Admission  Medication Sig Dispense Refill  . aspirin 81 MG tablet Take 81 mg by mouth daily.       . Calcium Carbonate-Vitamin D (CALCIUM + D) 600-200 MG-UNIT TABS Take by mouth.       . fish oil-omega-3 fatty acids 1000 MG capsule Take 2 g by mouth daily.       . Multiple Vitamin (MULTIVITAMIN) tablet Take 1 tablet by mouth daily.       . niacin 500 MG tablet Take 500 mg by mouth 2 (two) times daily with a meal.         Results for orders placed during the hospital encounter of 10/25/11 (from the past 48 hour(s))  POCT HEMOGLOBIN-HEMACUE     Status: Normal   Collection Time   10/25/11  9:54 AM      Component Value Range Comment   Hemoglobin 14.2  12.0 - 15.0 g/dL    No results found.  ROS  Blood pressure 145/90, pulse 94, temperature 97.7 F (36.5 C), temperature source Oral, resp. rate 18, height 5\' 6"  (1.676 m), weight 68.947 kg (152 lb), SpO2 98.00%. Physical Exam    Constitutional: She is oriented to person, place, and time. She appears well-developed and well-nourished.  HENT:  Head: Normocephalic and atraumatic.  Right Ear: External ear normal.  Left Ear: External ear normal.  Nose: Nose normal.  Mouth/Throat: Oropharynx is clear and moist.  Eyes: Conjunctivae normal and EOM are normal. Pupils are equal, round, and reactive to light.  Neck: Normal range of motion. Neck supple.  Cardiovascular: Normal rate, regular rhythm, normal heart sounds and intact distal pulses.   Respiratory: Effort normal and breath sounds normal.  GI: Soft. Bowel sounds are normal.  Musculoskeletal:       Lt shoulder has had interscalene block  Neurological: She is alert and oriented to person, place, and time. She has normal reflexes.  Skin: Skin is warm and dry.  Psychiatric: She has a normal mood and affect. Her behavior is normal. Judgment and thought content normal.     Assessment/Plan Painful lt shoulder with partial rotator cuff tear Lt shoulder arthroscopy with SAD  APLINGTON,JAMES P 10/25/2011, 11:33 AM

## 2011-10-25 NOTE — OR Nursing (Signed)
VTE prophylaxis was not ordered and was not used.  It was accidentally marked as used on the assessment.

## 2011-10-26 ENCOUNTER — Encounter (HOSPITAL_BASED_OUTPATIENT_CLINIC_OR_DEPARTMENT_OTHER): Payer: Self-pay | Admitting: Orthopedic Surgery

## 2011-10-26 NOTE — Op Note (Signed)
NAME:  Dawn Wolfe, Dawn Wolfe NO.:  [id number]  MEDICAL RECORD NO.:  [id number]  LOCATION:                                FACILITY:  [facility]  PHYSICIAN:  Marlowe Kays, M.D.  DATE OF BIRTH:  March 20, 1945  DATE OF PROCEDURE:  10/25/2011 DATE OF DISCHARGE:  10/25/2011                              OPERATIVE REPORT   PREOPERATIVE DIAGNOSIS:  Partial rotator cuff tear secondary to impingement, left shoulder.  POSTOPERATIVE DIAGNOSES: 1. Partial rotator cuff tear, secondary to impingement 2. Labral degenerative tear, left shoulder.  OPERATION: 1. Left shoulder arthroscopy with debridement of labrum. 2. SAD with shaving of bursal surface of rotator cuff.  SURGEON:  Marlowe Kays, MD  ASSISTANT:  Skip Mayer, PA-C.  ANESTHESIA:  General preceded by interscalene block.  PATHOLOGY AND JUSTIFICATION FOR PROCEDURE:  [name] assistance was necessary to assist with the arm and instrumentation.  MRI demonstrated the preoperative diagnoses.  At surgery, the labral degenerative tear was significant enough to create entrapment in the joint necessitating the labral debridement.  PROCEDURE:  Satisfactory interscalene block by anesthesia, satisfactory general anesthesia, beach chair position on the sliding frame.  Left shoulder was prepped with DuraPrep, draped in sterile field.  Time-out performed.  Anatomy of the shoulder joint was marked out and I infiltrated the posterior and lateral portal sites in the subacromial space with Marcaine with adrenaline for hemostasis.  Through posterior soft spot portal, atraumatically entered the glenohumeral joint, finding no significant articular surface rotator cuff disruption.  Biceps tendon looked to be intact.  Most of the glenoid and labrum looked fine except for the biceps attachment which had a good bit of diffuse fraying with long strands which appeared to be getting entrapped in the joint potentially.  Accordingly I advanced  the scope between the biceps tendon subscapularis and using a switching stick, made an anterior incision and over the switching stick, I placed a metal cannula followed by 4.2 shaver debriding down the labrum.  Pictures were taken.  I then redirected the scope in the subacromial space.  She had some bursal surface scuffing of the rotator cuff which I debrided down and I also probed and found no defect of any depth.  I then used a 4.2 shaver in addition to shaving the rotator cuff, clearing out some of the bursal tissue.  I then followed this with the 90-degree ArthroCare vaporizer, removing soft tissue from the undersurface of the acromion and then went back.  I then followed this with 4 mm oval bur, burring down the undersurface of the acromion until impingement appeared to be completely relieved.  I tied it up some of the soft tissue resection with the vaporizer and there was no unusual bleeding.  We then took documentary pictures with her arm abducted showing that there was no longer any impingement problems.  All fluid possible was removed.  The 3 portals were closed with 4-0 nylon.  Betadine, Adaptic, dry sterile dressing, shoulder immobilizer applied.  She tolerated the procedure well at the time of this dictation, was on the way to recovery room in satisfactory condition with no known complications.          ______________________________ Marlowe Kays, M.D.  JA/MEDQ  D:  10/25/2011  T:  10/26/2011  Job:  [id number]

## 2011-10-30 ENCOUNTER — Encounter (HOSPITAL_BASED_OUTPATIENT_CLINIC_OR_DEPARTMENT_OTHER): Payer: Self-pay

## 2011-11-15 ENCOUNTER — Encounter: Payer: Self-pay | Admitting: Internal Medicine

## 2011-11-15 ENCOUNTER — Ambulatory Visit (INDEPENDENT_AMBULATORY_CARE_PROVIDER_SITE_OTHER): Payer: Medicare Other | Admitting: Internal Medicine

## 2011-11-15 VITALS — BP 120/86 | HR 71 | Temp 98.2°F | Ht 67.0 in | Wt 165.0 lb

## 2011-11-15 DIAGNOSIS — R748 Abnormal levels of other serum enzymes: Secondary | ICD-10-CM | POA: Insufficient documentation

## 2011-11-15 DIAGNOSIS — E7800 Pure hypercholesterolemia, unspecified: Secondary | ICD-10-CM | POA: Insufficient documentation

## 2011-11-15 DIAGNOSIS — D72819 Decreased white blood cell count, unspecified: Secondary | ICD-10-CM

## 2011-11-15 DIAGNOSIS — R5383 Other fatigue: Secondary | ICD-10-CM

## 2011-11-15 DIAGNOSIS — R5381 Other malaise: Secondary | ICD-10-CM

## 2011-11-15 LAB — LIPID PANEL
Cholesterol: 205 mg/dL — ABNORMAL HIGH (ref 0–200)
HDL: 36.8 mg/dL — ABNORMAL LOW (ref >39.00)
Triglycerides: 205 mg/dL — ABNORMAL HIGH (ref 0.0–149.0)

## 2011-11-15 LAB — CBC WITH DIFFERENTIAL/PLATELET
Basophils Relative: 0.6 % (ref 0.0–3.0)
Eosinophils Relative: 4.8 % (ref 0.0–5.0)
HCT: 37.7 % (ref 36.0–46.0)
Hemoglobin: 12.8 g/dL (ref 12.0–15.0)
Lymphocytes Relative: 38.7 % (ref 12.0–46.0)
Lymphs Abs: 1.6 10*3/uL (ref 0.7–4.0)
Monocytes Relative: 5.7 % (ref 3.0–12.0)
Neutro Abs: 2.1 10*3/uL (ref 1.4–7.7)
RBC: 4.26 Mil/uL (ref 3.87–5.11)
WBC: 4.1 10*3/uL — ABNORMAL LOW (ref 4.5–10.5)

## 2011-11-15 LAB — COMPREHENSIVE METABOLIC PANEL
ALT: 38 U/L — ABNORMAL HIGH (ref 0–35)
CO2: 29 mEq/L (ref 19–32)
Calcium: 9.6 mg/dL (ref 8.4–10.5)
Chloride: 105 mEq/L (ref 96–112)
Creatinine, Ser: 0.6 mg/dL (ref 0.4–1.2)
GFR: 102.64 mL/min (ref >60.00)
Glucose, Bld: 97 mg/dL (ref 70–99)
Total Bilirubin: 1 mg/dL (ref 0.3–1.2)

## 2011-11-15 LAB — TSH: TSH: 1.41 u[IU]/mL (ref 0.35–5.50)

## 2011-11-15 NOTE — Patient Instructions (Addendum)
It was nice seeing you today.  I am glad you are doing well.  We will let you know about your labs once the results are available.  Let me know if you need anything.

## 2011-11-16 ENCOUNTER — Encounter: Payer: Self-pay | Admitting: Internal Medicine

## 2011-11-16 NOTE — Progress Notes (Signed)
  Subjective:    Patient ID: Dawn Wolfe, female    DOB: 1945-03-07, 66 y.o.   MRN: [id number]  HPI 66 year old female with past history of hypercholesterolemia who comes in today to follow up on these issues as well as to establish care.  She states she hs been doing relatively well.  Stays active.  No chest pain or tightness with increased activity or exertion.  Did have a previous abnormal pap.  Sees Dr Arlyce Dice.  Due next month for her follow up.  Just had laparoscopic shoulder surgery (left).  Doing well.  No significant pain  (performed by Dr Leslee Home).  Does report some fatigue, but overall she feels she is doing well.    Past Medical History  Diagnosis Date  . Obstructive sleep apnea on CPAP 2007  . History of colonoscopy 2003    Normal, Dr. Sherin Quarry, [place]   . Normal exercise sestamibi stress test 2008    Ken Fath  . Rotator cuff tear, left   . Hypercholesterolemia     Review of Systems Patient denies any headache, lightheadedness or dizziness.  No sinus or allergy symptoms.   No chest pain, tightness or palpitations.  No increased shortness of breath, cough or congestion.  No nausea or vomiting.  No abdominal pain or cramping.  No bowel change, such as diarrhea, constipation, BRBPR or melana.  No urine change.        Objective:   Physical Exam Filed Vitals:   11/15/11 0823  BP: 120/86  Pulse: 71  Temp: 98.2 F (36.8 C)   Blood pressure recheck:  73/28  66 year old female in no acute distress.   HEENT:  Nares - clear.  OP- without lesions or erythema.  NECK:  Supple, nontender.  No audible bruit.   HEART:  Appears to be regular. LUNGS:  Without crackles or wheezing audible.  Respirations even and unlabored.   RADIAL PULSE:  Equal bilaterally.  ABDOMEN:  Soft, nontender.  No audible abdominal bruit.   EXTREMITIES:  No increased edema to be present.                     Assessment & Plan:  ABNORMAL LIVER ENZYMES.   Will repeat today.  If persistent elevation,  will obtain abdominal ultrasound.    GYN.  Sees Dr Arlyce Dice.  Up to date.  Due next month.  He does her breast exams, pelvic exams and mammograms.    FATIGUE.  Check cbc, met c and tsh.    MSK.  S/P laparoscopic shoulder surgery.  Doing well.  Continues to follow up with Dr Leslee Home.   HEALTH MAINTENANCE.  See above.  Breast, pelvic and pap smears through Dr Marzetta Board office.  Mammogram scheduled through there as well.  Obtain results.  Check cholesterol.

## 2011-11-19 ENCOUNTER — Encounter: Payer: Self-pay | Admitting: Internal Medicine

## 2011-11-19 NOTE — Assessment & Plan Note (Signed)
Low cholesterol diet and exercise.  Desires not to take statins.  Check lipid panel.

## 2011-11-19 NOTE — Assessment & Plan Note (Signed)
Recheck liver panel today.  If persistent elevation, will check abdominal ultrasound.   

## 2011-11-23 ENCOUNTER — Ambulatory Visit: Payer: Self-pay | Admitting: Internal Medicine

## 2011-12-01 ENCOUNTER — Telehealth: Payer: Self-pay | Admitting: Internal Medicine

## 2011-12-01 DIAGNOSIS — N281 Cyst of kidney, acquired: Secondary | ICD-10-CM

## 2011-12-01 NOTE — Telephone Encounter (Signed)
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

## 2011-12-05 ENCOUNTER — Ambulatory Visit: Payer: Self-pay | Admitting: Internal Medicine

## 2011-12-09 ENCOUNTER — Telehealth: Payer: Self-pay | Admitting: Internal Medicine

## 2011-12-09 NOTE — Telephone Encounter (Signed)
Pt had renal ultrasound 12/05/11.  Renal ultrasound revealed a benign appearing simple cyst in the superior pole of the left kidney.  Per note on scan - pt notified cyst is benign.

## 2011-12-18 ENCOUNTER — Encounter: Payer: Self-pay | Admitting: Internal Medicine

## 2011-12-19 ENCOUNTER — Other Ambulatory Visit (INDEPENDENT_AMBULATORY_CARE_PROVIDER_SITE_OTHER): Payer: Medicare Other

## 2011-12-19 ENCOUNTER — Telehealth: Payer: Self-pay | Admitting: Internal Medicine

## 2011-12-19 DIAGNOSIS — R945 Abnormal results of liver function studies: Secondary | ICD-10-CM

## 2011-12-19 DIAGNOSIS — R748 Abnormal levels of other serum enzymes: Secondary | ICD-10-CM

## 2011-12-19 DIAGNOSIS — D72819 Decreased white blood cell count, unspecified: Secondary | ICD-10-CM

## 2011-12-19 LAB — CBC WITH DIFFERENTIAL/PLATELET
Basophils Absolute: 0 10*3/uL (ref 0.0–0.1)
Eosinophils Absolute: 0.3 10*3/uL (ref 0.0–0.7)
HCT: 37.8 % (ref 36.0–46.0)
Hemoglobin: 12.8 g/dL (ref 12.0–15.0)
Lymphs Abs: 1.6 10*3/uL (ref 0.7–4.0)
MCHC: 33.9 g/dL (ref 30.0–36.0)
MCV: 88.4 fl (ref 78.0–100.0)
Monocytes Absolute: 0.3 10*3/uL (ref 0.1–1.0)
Monocytes Relative: 6.9 % (ref 3.0–12.0)
Neutro Abs: 2.7 10*3/uL (ref 1.4–7.7)
Platelets: 160 10*3/uL (ref 150.0–400.0)
RDW: 13.3 % (ref 11.5–14.6)

## 2011-12-19 LAB — HEPATIC FUNCTION PANEL
AST: 39 U/L — ABNORMAL HIGH (ref 0–37)
Albumin: 4.4 g/dL (ref 3.5–5.2)
Total Bilirubin: 0.7 mg/dL (ref 0.3–1.2)

## 2011-12-19 NOTE — Telephone Encounter (Signed)
Pt notified of labs via my chart.  Needs a lab appt in 3 months - not fasting.  Also needs a follow up appt with me in 6 months.  Thanks.  i will place the order for the lab

## 2012-04-01 ENCOUNTER — Encounter: Payer: Self-pay | Admitting: Internal Medicine

## 2012-04-02 ENCOUNTER — Telehealth: Payer: Self-pay | Admitting: Internal Medicine

## 2012-04-02 NOTE — Telephone Encounter (Signed)
I received a My Chart message regarding a need for follow up regarding her CPAP.  Please see if she can come in on 04/03/12 (wednesday) for a follow up appt.  Please put her in the 10:45 spot and block the 9:15 spot.  Thanks.

## 2012-04-02 NOTE — Telephone Encounter (Signed)
You can see if she can come in later in the day on Wednesday (04/03/12) - i.e, 1:00 or 1:30 or this afternoon at the open spot

## 2012-04-02 NOTE — Telephone Encounter (Signed)
Pt aware of appointment 3/19 @ 1:30

## 2012-04-02 NOTE — Telephone Encounter (Signed)
Dawn Wolfe cannot come in 04/03/12 she has a dental appointment in [place]

## 2012-04-03 ENCOUNTER — Ambulatory Visit: Payer: Medicare Other | Admitting: Internal Medicine

## 2012-04-03 ENCOUNTER — Encounter: Payer: Self-pay | Admitting: Internal Medicine

## 2012-04-03 ENCOUNTER — Ambulatory Visit (INDEPENDENT_AMBULATORY_CARE_PROVIDER_SITE_OTHER): Payer: Medicare Other | Admitting: Internal Medicine

## 2012-04-03 VITALS — BP 111/72 | HR 83 | Temp 98.2°F | Ht 65.5 in | Wt 160.0 lb

## 2012-04-03 DIAGNOSIS — G4733 Obstructive sleep apnea (adult) (pediatric): Secondary | ICD-10-CM

## 2012-04-03 DIAGNOSIS — E7800 Pure hypercholesterolemia, unspecified: Secondary | ICD-10-CM

## 2012-04-03 DIAGNOSIS — R7989 Other specified abnormal findings of blood chemistry: Secondary | ICD-10-CM

## 2012-04-03 DIAGNOSIS — R748 Abnormal levels of other serum enzymes: Secondary | ICD-10-CM

## 2012-04-03 DIAGNOSIS — R945 Abnormal results of liver function studies: Secondary | ICD-10-CM

## 2012-04-03 LAB — HEPATIC FUNCTION PANEL
ALT: 44 U/L — ABNORMAL HIGH (ref 0–35)
AST: 40 U/L — ABNORMAL HIGH (ref 0–37)
Bilirubin, Direct: 0 mg/dL (ref 0.0–0.3)
Total Bilirubin: 0.5 mg/dL (ref 0.3–1.2)

## 2012-04-03 NOTE — Assessment & Plan Note (Signed)
Uses CPAP.  Needs nasal swifts.  Rx written.  Called Sigmund Hazel with [facility] - [phone].  Left message to call back.

## 2012-04-03 NOTE — Progress Notes (Signed)
  Subjective:    Patient ID: Dawn Wolfe, female    DOB: Nov 27, 1945, 67 y.o.   MRN: [id number]  HPI 67 year old female with past history of hypercholesterolemia who comes in today as a work in with concerns regarding the need for CPAP supplies.  She has been diagnosed with sleep apnea.  Uses CPAP every night.  Tolerates well.  Needs new supplies and was told she had to come in for evaluation - to discuss.  Stays active.  No chest pain or tightness with increased activity or exertion.  Needs nasal swifts.      Past Medical History  Diagnosis Date  . Obstructive sleep apnea on CPAP 2007  . History of colonoscopy 2003    Normal, Dr. Sherin Quarry, [place]   . Normal exercise sestamibi stress test 2008    [facility] Fath  . Rotator cuff tear, left   . Hypercholesterolemia     Current Outpatient Prescriptions on File Prior to Visit  Medication Sig Dispense Refill  . aspirin 81 MG tablet Take 81 mg by mouth daily.       . Calcium Carbonate-Vitamin D (CALCIUM + D) 600-200 MG-UNIT TABS Take by mouth.       . fish oil-omega-3 fatty acids 1000 MG capsule Take 2 g by mouth daily.       . Multiple Vitamin (MULTIVITAMIN) tablet Take 1 tablet by mouth daily.       . niacin 500 MG tablet Take 500 mg by mouth 2 (two) times daily with a meal.        No current facility-administered medications on file prior to visit.    Review of Systems Patient denies any headache, lightheadedness or dizziness.  No sinus or allergy symptoms.   No chest pain, tightness or palpitations.  No increased shortness of breath, cough or congestion.  No nausea or vomiting.  No abdominal pain or cramping.  No bowel change, such as diarrhea, constipation, BRBPR or melana.  No urine change.        Objective:   Physical Exam  Filed Vitals:   04/03/12 1333  BP: 111/72  Pulse: 83  Temp: 98.2 F (60.47 C)   67 year old female in no acute distress.   HEENT:  Nares - clear.  OP- without lesions or erythema.  NECK:  Supple, nontender.   No audible bruit.   HEART:  Appears to be regular. LUNGS:  Without crackles or wheezing audible.  Respirations even and unlabored.   RADIAL PULSE:  Equal bilaterally.  ABDOMEN:  Soft, nontender.  No audible abdominal bruit.   EXTREMITIES:  No increased edema to be present.                     Assessment & Plan:  ABNORMAL LIVER ENZYMES.   Will repeat today.  Previous abdominal ultrasound revealed no significant abnormality of the liver.  Follow.   GYN.  Sees Dr Arlyce Dice.  Up to date.  He does her breast exams, pelvic exams and mammograms.    MSK.  S/P laparoscopic shoulder surgery.  Doing well.  Continues to follow up with Dr Leslee Home.   HEALTH MAINTENANCE.  See above.  Breast, pelvic and pap smears through Dr Marzetta Board office.  Mammogram scheduled through there as well.

## 2012-04-03 NOTE — Assessment & Plan Note (Signed)
Repeat today.  Abdominal ultrasound as outlined.  Follow.

## 2012-04-03 NOTE — Assessment & Plan Note (Signed)
Low cholesterol diet and exercise.  Follow.   

## 2012-04-05 ENCOUNTER — Ambulatory Visit: Payer: Medicare Other | Admitting: Internal Medicine

## 2012-04-05 ENCOUNTER — Telehealth: Payer: Self-pay | Admitting: Internal Medicine

## 2012-04-05 NOTE — Telephone Encounter (Signed)
Pt checking on her lab results

## 2012-04-08 NOTE — Telephone Encounter (Signed)
Patient was in office today with her husband. Patient stated that she had received lab results on mychart.

## 2012-04-12 ENCOUNTER — Encounter: Payer: Self-pay | Admitting: Internal Medicine

## 2012-04-12 ENCOUNTER — Telehealth: Payer: Self-pay | Admitting: Internal Medicine

## 2012-04-12 NOTE — Telephone Encounter (Signed)
Notified via my chart of lab results

## 2012-04-15 ENCOUNTER — Encounter: Payer: Self-pay | Admitting: Internal Medicine

## 2012-04-16 ENCOUNTER — Encounter: Payer: Self-pay | Admitting: Emergency Medicine

## 2012-04-16 ENCOUNTER — Telehealth: Payer: Self-pay | Admitting: Internal Medicine

## 2012-04-16 DIAGNOSIS — R945 Abnormal results of liver function studies: Secondary | ICD-10-CM

## 2012-04-16 NOTE — Telephone Encounter (Signed)
Order placed for GI referral.   

## 2012-05-06 ENCOUNTER — Encounter: Payer: Self-pay | Admitting: Internal Medicine

## 2012-08-21 ENCOUNTER — Other Ambulatory Visit: Payer: Self-pay

## 2012-11-20 ENCOUNTER — Ambulatory Visit (INDEPENDENT_AMBULATORY_CARE_PROVIDER_SITE_OTHER): Payer: Medicare Other | Admitting: Adult Health

## 2012-11-20 ENCOUNTER — Encounter: Payer: Self-pay | Admitting: Adult Health

## 2012-11-20 VITALS — BP 118/80 | HR 115 | Temp 97.9°F | Resp 16 | Ht 65.5 in | Wt 160.0 lb

## 2012-11-20 DIAGNOSIS — J029 Acute pharyngitis, unspecified: Secondary | ICD-10-CM

## 2012-11-20 DIAGNOSIS — S92902A Unspecified fracture of left foot, initial encounter for closed fracture: Secondary | ICD-10-CM | POA: Insufficient documentation

## 2012-11-20 MED ORDER — AZITHROMYCIN 250 MG PO TABS: ORAL_TABLET | ORAL | Status: DC

## 2012-11-20 NOTE — Assessment & Plan Note (Signed)
Start Azithromycin today. Drink fluids. Chloraseptic spray. Salt water gargles.

## 2012-11-20 NOTE — Progress Notes (Signed)
Pre-visit discussion using our clinic review tool. No additional management support is needed unless otherwise documented below in the visit note., 

## 2012-11-20 NOTE — Patient Instructions (Signed)
  Start Azithromycin today. Take 2 tablets today then 1 tablet daily for the next 4 days.  Fluids to stay hydrated.  Continue with chloraseptic spray and salt water solution gargles.  Call if symptoms not improved after completion of antibiotic or sooner if necessary.

## 2012-11-20 NOTE — Progress Notes (Signed)
  Subjective:    Patient ID: Dawn Wolfe, female    DOB: [birth year], 67 y.o.   MRN: [id number]  HPI  Patient is a pleasant 67 year old female who presents to clinic with complaints of sinus drainage, postnasal drip, sore throat that began on Saturday. She has been using Chloraseptic spray with some relief. She denies fever or chills.   Current Outpatient Prescriptions on File Prior to Visit  Medication Sig Dispense Refill  . aspirin 81 MG tablet Take 81 mg by mouth daily.       . Calcium Carbonate-Vitamin D (CALCIUM + D) 600-200 MG-UNIT TABS Take by mouth.       . fish oil-omega-3 fatty acids 1000 MG capsule Take 2 g by mouth daily.       . Multiple Vitamin (MULTIVITAMIN) tablet Take 1 tablet by mouth daily.       . niacin 500 MG tablet Take 500 mg by mouth 2 (two) times daily with a meal.        No current facility-administered medications on file prior to visit.      Review of Systems  Constitutional: Negative for fever and chills.  HENT: Positive for postnasal drip, rhinorrhea, sneezing and sore throat.   Respiratory: Positive for cough.        Objective:   Physical Exam  Constitutional: She is oriented to person, place, and time. She appears well-developed and well-nourished. No distress.  HENT:  Head: Normocephalic and atraumatic.  Pharyngeal erythema with drainage noted posteriorly.  Cardiovascular: Normal rate, regular rhythm and normal heart sounds.  Exam reveals no gallop.   No murmur heard. Pulmonary/Chest: Effort normal and breath sounds normal. No respiratory distress. She has no wheezes. She has no rales.  Lymphadenopathy:    She has no cervical adenopathy.  Neurological: She is alert and oriented to person, place, and time.  Psychiatric: She has a normal mood and affect. Her behavior is normal. Judgment and thought content normal.          Assessment & Plan:

## 2013-03-11 ENCOUNTER — Encounter: Payer: Medicare Other | Admitting: Internal Medicine

## 2013-03-28 ENCOUNTER — Encounter: Payer: Self-pay | Admitting: Internal Medicine

## 2013-03-28 ENCOUNTER — Ambulatory Visit (INDEPENDENT_AMBULATORY_CARE_PROVIDER_SITE_OTHER): Payer: Medicare Other | Admitting: Internal Medicine

## 2013-03-28 VITALS — BP 130/90 | HR 84 | Temp 98.4°F | Ht 65.5 in | Wt 158.0 lb

## 2013-03-28 DIAGNOSIS — S92902A Unspecified fracture of left foot, initial encounter for closed fracture: Secondary | ICD-10-CM

## 2013-03-28 DIAGNOSIS — IMO0001 Reserved for inherently not codable concepts without codable children: Secondary | ICD-10-CM

## 2013-03-28 DIAGNOSIS — E7800 Pure hypercholesterolemia, unspecified: Secondary | ICD-10-CM

## 2013-03-28 DIAGNOSIS — R748 Abnormal levels of other serum enzymes: Secondary | ICD-10-CM

## 2013-03-28 DIAGNOSIS — Z1239 Encounter for other screening for malignant neoplasm of breast: Secondary | ICD-10-CM

## 2013-03-28 DIAGNOSIS — Z0000 Encounter for general adult medical examination without abnormal findings: Secondary | ICD-10-CM

## 2013-03-28 DIAGNOSIS — R030 Elevated blood-pressure reading, without diagnosis of hypertension: Secondary | ICD-10-CM

## 2013-03-28 DIAGNOSIS — S92909A Unspecified fracture of unspecified foot, initial encounter for closed fracture: Secondary | ICD-10-CM

## 2013-03-28 DIAGNOSIS — G4733 Obstructive sleep apnea (adult) (pediatric): Secondary | ICD-10-CM

## 2013-03-28 NOTE — Progress Notes (Signed)
Pre-visit discussion using our clinic review tool. No additional management support is needed unless otherwise documented below in the visit note.  

## 2013-03-28 NOTE — Progress Notes (Signed)
Subjective:    Patient ID: Dawn Wolfe, female    DOB: 16-Aug-1945, 68 y.o.   MRN: [id number]  HPI 68 year old female with past history of hypercholesterolemia who comes in today for her complete physical exam.  She has been diagnosed with sleep apnea.  Uses CPAP every night.  Tolerates well.  Stays active.  No chest pain or tightness with increased activity or exertion.  She slipped on ice.  Had a left foot fracture.  Saw Dr Cleda Mccreedy.  Is doing better.  Knee is better.  No acid reflux reported.  No abdominal pain or cramping.  No bowel change.      Past Medical History  Diagnosis Date  . Obstructive sleep apnea on CPAP 2007  . History of colonoscopy 2003    Normal, Dr. Sammuel Cooper, [place]   . Normal exercise sestamibi stress test 2008    Ken Fath  . Rotator cuff tear, left   . Hypercholesterolemia     Current Outpatient Prescriptions on File Prior to Visit  Medication Sig Dispense Refill  . aspirin 81 MG tablet Take 81 mg by mouth daily.       . Calcium Carbonate-Vitamin D (CALCIUM + D) 600-200 MG-UNIT TABS Take by mouth.       . fish oil-omega-3 fatty acids 1000 MG capsule Take 2 g by mouth daily.       . Multiple Vitamin (MULTIVITAMIN) tablet Take 1 tablet by mouth daily.       . niacin 500 MG tablet Take 500 mg by mouth 2 (two) times daily with a meal.        No current facility-administered medications on file prior to visit.    Review of Systems Patient denies any headache, lightheadedness or dizziness.  No sinus or allergy symptoms.   No chest pain, tightness or palpitations.  No increased shortness of breath, cough or congestion.  No nausea or vomiting.  No acid reflux.  No abdominal pain or cramping.  No bowel change, such as diarrhea, constipation, BRBPR or melana.  No urine change.  S/p left foot fracture.  Doing better.  Overall feels she is doing well.       Objective:   Physical Exam  Filed Vitals:   03/28/13 1009  BP: 130/90  Pulse: 84  Temp: 98.4 F (36.9 C)    Blood pressure recheck:  87/86  68 year old female in no acute distress.   HEENT:  Nares- clear.  Oropharynx - without lesions. NECK:  Supple.  Nontender.  No audible bruit.  HEART:  Appears to be regular. LUNGS:  No crackles or wheezing audible.  Respirations even and unlabored.  RADIAL PULSE:  Equal bilaterally.    BREASTS:  No nipple discharge or nipple retraction present.  Could not appreciate any distinct nodules or axillary adenopathy.  ABDOMEN:  Soft, nontender.  Bowel sounds present and normal.  No audible abdominal bruit.  GU:  Not performed.     EXTREMITIES:  No increased edema present.  DP pulses palpable and equal bilaterally.           Assessment & Plan:  ABNORMAL LIVER ENZYMES.   Will repeat with next labs.  Previous abdominal ultrasound revealed no significant abnormality of the liver.  Follow.   GYN.  Has been seeing Dr Deatra Ina.  Up to date.  He does her breast exams, pelvic exams and mammograms.    MSK.  S/P laparoscopic shoulder surgery.  Doing well.  Continues to follow up with  Dr Collier Salina.   HEALTH MAINTENANCE.  Breast, pelvic and pap smears have been done through Dr Kelby Fam office.  Breast exam today.  Overdue mammogram.  Schedule.  She declined prevnar.

## 2013-03-30 ENCOUNTER — Encounter: Payer: Self-pay | Admitting: Internal Medicine

## 2013-03-30 DIAGNOSIS — R030 Elevated blood-pressure reading, without diagnosis of hypertension: Secondary | ICD-10-CM | POA: Insufficient documentation

## 2013-03-30 NOTE — Assessment & Plan Note (Signed)
Blood pressure as outlined.  Have her spot check her pressures and record.  Send in over the next few weeks.  Check metabolic panel.

## 2013-03-30 NOTE — Assessment & Plan Note (Signed)
Saw Dr Cleda Mccreedy.  Better.  Follow.

## 2013-03-30 NOTE — Assessment & Plan Note (Signed)
Repeat with next labs.  Abdominal ultrasound as outlined.  Follow.

## 2013-03-30 NOTE — Assessment & Plan Note (Signed)
Uses CPAP.  Doing well. 

## 2013-03-30 NOTE — Assessment & Plan Note (Signed)
Low cholesterol diet and exercise.  Follow.   

## 2013-04-02 ENCOUNTER — Encounter: Payer: Self-pay | Admitting: Emergency Medicine

## 2013-04-08 ENCOUNTER — Other Ambulatory Visit (INDEPENDENT_AMBULATORY_CARE_PROVIDER_SITE_OTHER): Payer: Medicare Other

## 2013-04-08 DIAGNOSIS — R748 Abnormal levels of other serum enzymes: Secondary | ICD-10-CM

## 2013-04-08 DIAGNOSIS — Z0000 Encounter for general adult medical examination without abnormal findings: Secondary | ICD-10-CM

## 2013-04-08 DIAGNOSIS — IMO0001 Reserved for inherently not codable concepts without codable children: Secondary | ICD-10-CM

## 2013-04-08 DIAGNOSIS — E7800 Pure hypercholesterolemia, unspecified: Secondary | ICD-10-CM

## 2013-04-08 DIAGNOSIS — G4733 Obstructive sleep apnea (adult) (pediatric): Secondary | ICD-10-CM

## 2013-04-08 DIAGNOSIS — R030 Elevated blood-pressure reading, without diagnosis of hypertension: Secondary | ICD-10-CM

## 2013-04-08 LAB — LIPID PANEL
CHOL/HDL RATIO: 5
Cholesterol: 223 mg/dL — ABNORMAL HIGH (ref 0–200)
HDL: 44.9 mg/dL (ref >39.00)
LDL Cholesterol: 146 mg/dL — ABNORMAL HIGH (ref 0–99)
Triglycerides: 159 mg/dL — ABNORMAL HIGH (ref 0.0–149.0)
VLDL: 31.8 mg/dL (ref 0.0–40.0)

## 2013-04-08 LAB — BASIC METABOLIC PANEL
BUN: 16 mg/dL (ref 6–23)
CALCIUM: 9.8 mg/dL (ref 8.4–10.5)
CHLORIDE: 104 meq/L (ref 96–112)
CO2: 29 meq/L (ref 19–32)
Creatinine, Ser: 0.8 mg/dL (ref 0.4–1.2)
GFR: 71.98 mL/min (ref >60.00)
Glucose, Bld: 104 mg/dL — ABNORMAL HIGH (ref 70–99)
Potassium: 4.5 mEq/L (ref 3.5–5.1)
SODIUM: 138 meq/L (ref 135–145)

## 2013-04-08 LAB — CBC WITH DIFFERENTIAL/PLATELET
Basophils Absolute: 0 10*3/uL (ref 0.0–0.1)
Basophils Relative: 0.5 % (ref 0.0–3.0)
EOS ABS: 0.2 10*3/uL (ref 0.0–0.7)
Eosinophils Relative: 5.3 % — ABNORMAL HIGH (ref 0.0–5.0)
HEMATOCRIT: 34.6 % — AB (ref 36.0–46.0)
HEMOGLOBIN: 11.7 g/dL — AB (ref 12.0–15.0)
LYMPHS ABS: 1.5 10*3/uL (ref 0.7–4.0)
Lymphocytes Relative: 33 % (ref 12.0–46.0)
MCHC: 33.8 g/dL (ref 30.0–36.0)
MCV: 83.8 fl (ref 78.0–100.0)
MONOS PCT: 5.6 % (ref 3.0–12.0)
Monocytes Absolute: 0.3 10*3/uL (ref 0.1–1.0)
NEUTROS ABS: 2.5 10*3/uL (ref 1.4–7.7)
Neutrophils Relative %: 55.6 % (ref 43.0–77.0)
Platelets: 173 10*3/uL (ref 150.0–400.0)
RBC: 4.13 Mil/uL (ref 3.87–5.11)
RDW: 14.4 % (ref 11.5–14.6)
WBC: 4.6 10*3/uL (ref 4.5–10.5)

## 2013-04-08 LAB — HEPATIC FUNCTION PANEL
ALT: 34 U/L (ref 0–35)
AST: 37 U/L (ref 0–37)
Albumin: 4.3 g/dL (ref 3.5–5.2)
Alkaline Phosphatase: 55 U/L (ref 39–117)
BILIRUBIN TOTAL: 0.8 mg/dL (ref 0.3–1.2)
Bilirubin, Direct: 0.1 mg/dL (ref 0.0–0.3)
Total Protein: 7.5 g/dL (ref 6.0–8.3)

## 2013-04-08 LAB — TSH: TSH: 2.04 u[IU]/mL (ref 0.35–5.50)

## 2013-04-09 ENCOUNTER — Encounter: Payer: Self-pay | Admitting: Internal Medicine

## 2013-04-09 ENCOUNTER — Telehealth: Payer: Self-pay | Admitting: Internal Medicine

## 2013-04-09 DIAGNOSIS — D649 Anemia, unspecified: Secondary | ICD-10-CM

## 2013-04-09 LAB — VARICELLA ZOSTER ANTIBODY, IGG: Varicella IgG: 940.9 Index — ABNORMAL HIGH (ref <135.00)

## 2013-04-09 NOTE — Telephone Encounter (Signed)
Pt notified of lab results via my chart.  She needs a non fasting lab appt in 2 weeks.  Please schedule and contact her with a lab appt date and time.  Thanks.    Dr Nicki Reaper

## 2013-04-09 NOTE — Telephone Encounter (Signed)
Appointment 4/8 sent pt my chart message with date and time of appointment

## 2013-04-10 MED ORDER — PRAVASTATIN SODIUM 10 MG PO TABS: 10.0000 mg | ORAL_TABLET | Freq: Every day | ORAL | Status: DC

## 2013-04-10 NOTE — Telephone Encounter (Signed)
Pravastatin ordered and sent in to pharmacy.  I wrote rx for shingels vaccine.  Pt needs a liver panel checked in 4 weeks.  Please schedule her for a non fasting lab appt.  Thanks.  Wants rx for shingles vaccine sent in to [facility].  rx in your box.    Dr Nicki Reaper

## 2013-04-11 ENCOUNTER — Encounter: Payer: Self-pay | Admitting: Internal Medicine

## 2013-04-17 ENCOUNTER — Ambulatory Visit: Payer: Self-pay | Admitting: Internal Medicine

## 2013-04-17 LAB — HM MAMMOGRAPHY: HM Mammogram: NEGATIVE

## 2013-04-23 ENCOUNTER — Other Ambulatory Visit (INDEPENDENT_AMBULATORY_CARE_PROVIDER_SITE_OTHER): Payer: Medicare Other

## 2013-04-23 DIAGNOSIS — D649 Anemia, unspecified: Secondary | ICD-10-CM

## 2013-04-23 LAB — FERRITIN: Ferritin: 9.2 ng/mL — ABNORMAL LOW (ref 10.0–291.0)

## 2013-04-23 LAB — CBC WITH DIFFERENTIAL/PLATELET
BASOS ABS: 0 10*3/uL (ref 0.0–0.1)
Basophils Relative: 0.4 % (ref 0.0–3.0)
EOS ABS: 0.2 10*3/uL (ref 0.0–0.7)
Eosinophils Relative: 3.8 % (ref 0.0–5.0)
HCT: 35.4 % — ABNORMAL LOW (ref 36.0–46.0)
Hemoglobin: 12.2 g/dL (ref 12.0–15.0)
Lymphocytes Relative: 31.8 % (ref 12.0–46.0)
Lymphs Abs: 1.8 10*3/uL (ref 0.7–4.0)
MCHC: 34.4 g/dL (ref 30.0–36.0)
MCV: 83 fl (ref 78.0–100.0)
Monocytes Absolute: 0.4 10*3/uL (ref 0.1–1.0)
Monocytes Relative: 7.3 % (ref 3.0–12.0)
NEUTROS PCT: 56.7 % (ref 43.0–77.0)
Neutro Abs: 3.2 10*3/uL (ref 1.4–7.7)
Platelets: 201 10*3/uL (ref 150.0–400.0)
RBC: 4.26 Mil/uL (ref 3.87–5.11)
RDW: 14.4 % (ref 11.5–14.6)
WBC: 5.7 10*3/uL (ref 4.5–10.5)

## 2013-04-23 LAB — IBC PANEL
Iron: 70 ug/dL (ref 42–145)
Saturation Ratios: 14.4 % — ABNORMAL LOW (ref 20.0–50.0)
Transferrin: 347.9 mg/dL (ref 212.0–360.0)

## 2013-04-23 LAB — VITAMIN B12: Vitamin B-12: 562 pg/mL (ref 211–911)

## 2013-04-27 ENCOUNTER — Encounter: Payer: Self-pay | Admitting: Internal Medicine

## 2013-04-28 ENCOUNTER — Other Ambulatory Visit: Payer: Self-pay | Admitting: Internal Medicine

## 2013-04-28 DIAGNOSIS — E611 Iron deficiency: Secondary | ICD-10-CM

## 2013-04-28 NOTE — Progress Notes (Signed)
Orders placed for f/u labs.  

## 2013-05-06 ENCOUNTER — Encounter: Payer: Self-pay | Admitting: Internal Medicine

## 2013-05-12 ENCOUNTER — Other Ambulatory Visit (INDEPENDENT_AMBULATORY_CARE_PROVIDER_SITE_OTHER): Payer: Medicare Other

## 2013-05-12 ENCOUNTER — Encounter: Payer: Self-pay | Admitting: Internal Medicine

## 2013-05-12 ENCOUNTER — Other Ambulatory Visit: Payer: Self-pay | Admitting: Internal Medicine

## 2013-05-12 DIAGNOSIS — D509 Iron deficiency anemia, unspecified: Secondary | ICD-10-CM

## 2013-05-12 DIAGNOSIS — R748 Abnormal levels of other serum enzymes: Secondary | ICD-10-CM

## 2013-05-12 DIAGNOSIS — D649 Anemia, unspecified: Secondary | ICD-10-CM

## 2013-05-12 DIAGNOSIS — R030 Elevated blood-pressure reading, without diagnosis of hypertension: Secondary | ICD-10-CM

## 2013-05-12 DIAGNOSIS — IMO0001 Reserved for inherently not codable concepts without codable children: Secondary | ICD-10-CM

## 2013-05-12 DIAGNOSIS — E611 Iron deficiency: Secondary | ICD-10-CM

## 2013-05-12 DIAGNOSIS — E7800 Pure hypercholesterolemia, unspecified: Secondary | ICD-10-CM

## 2013-05-12 LAB — FERRITIN: FERRITIN: 8.4 ng/mL — AB (ref 10.0–291.0)

## 2013-05-12 LAB — HEMOGLOBIN: HEMOGLOBIN: 12.1 g/dL (ref 12.0–15.0)

## 2013-05-12 LAB — IBC PANEL
IRON: 43 ug/dL (ref 42–145)
SATURATION RATIOS: 9 % — AB (ref 20.0–50.0)
Transferrin: 340.3 mg/dL (ref 212.0–360.0)

## 2013-05-12 NOTE — Progress Notes (Signed)
Order placed for f/u labs.  

## 2013-05-28 ENCOUNTER — Other Ambulatory Visit (INDEPENDENT_AMBULATORY_CARE_PROVIDER_SITE_OTHER): Payer: Medicare Other

## 2013-05-28 ENCOUNTER — Other Ambulatory Visit: Payer: Self-pay | Admitting: Internal Medicine

## 2013-05-28 DIAGNOSIS — D649 Anemia, unspecified: Secondary | ICD-10-CM

## 2013-05-28 LAB — FECAL OCCULT BLOOD, IMMUNOCHEMICAL: FECAL OCCULT BLD: NEGATIVE

## 2013-05-29 ENCOUNTER — Encounter: Payer: Self-pay | Admitting: Internal Medicine

## 2013-06-09 ENCOUNTER — Other Ambulatory Visit: Payer: Self-pay | Admitting: Internal Medicine

## 2013-10-01 ENCOUNTER — Ambulatory Visit (INDEPENDENT_AMBULATORY_CARE_PROVIDER_SITE_OTHER): Payer: Medicare Other | Admitting: Internal Medicine

## 2013-10-01 ENCOUNTER — Encounter: Payer: Self-pay | Admitting: Internal Medicine

## 2013-10-01 VITALS — BP 110/80 | HR 87 | Temp 98.0°F | Ht 65.5 in | Wt 156.0 lb

## 2013-10-01 DIAGNOSIS — IMO0001 Reserved for inherently not codable concepts without codable children: Secondary | ICD-10-CM

## 2013-10-01 DIAGNOSIS — R748 Abnormal levels of other serum enzymes: Secondary | ICD-10-CM

## 2013-10-01 DIAGNOSIS — E7800 Pure hypercholesterolemia, unspecified: Secondary | ICD-10-CM

## 2013-10-01 DIAGNOSIS — R030 Elevated blood-pressure reading, without diagnosis of hypertension: Secondary | ICD-10-CM

## 2013-10-01 DIAGNOSIS — G4733 Obstructive sleep apnea (adult) (pediatric): Secondary | ICD-10-CM

## 2013-10-01 NOTE — Progress Notes (Signed)
Pre visit review using our clinic review tool, if applicable. No additional management support is needed unless otherwise documented below in the visit note. 

## 2013-10-01 NOTE — Progress Notes (Signed)
Subjective:    Patient ID: Dawn Wolfe, female    DOB: 07-04-1945, 68 y.o.   MRN: [id number]  HPI 68 year old female with past history of hypercholesterolemia who comes in today for a scheduled follow up.  She has been diagnosed with sleep apnea.  Uses CPAP every night.  Tolerates well.  Stays active.  No chest pain or tightness with increased activity or exertion.   No acid reflux reported.  No abdominal pain or cramping.  No bowel change.  Has had some congestion recently.  Better now.  Using flonase.  Discussed saline nasal spray.      Past Medical History  Diagnosis Date  . Obstructive sleep apnea on CPAP 2007  . History of colonoscopy 2003    Normal, Dr. Sammuel Cooper, [place]   . Normal exercise sestamibi stress test 2008    Ken Fath  . Rotator cuff tear, left   . Hypercholesterolemia     Current Outpatient Prescriptions on File Prior to Visit  Medication Sig Dispense Refill  . aspirin 81 MG tablet Take 81 mg by mouth daily.       . Calcium Carbonate-Vitamin D (CALCIUM + D) 600-200 MG-UNIT TABS Take by mouth.       . fish oil-omega-3 fatty acids 1000 MG capsule Take 2 g by mouth daily.       . Multiple Vitamin (MULTIVITAMIN) tablet Take 1 tablet by mouth daily.       . niacin 500 MG tablet Take 500 mg by mouth 2 (two) times daily with a meal.       . pravastatin (PRAVACHOL) 10 MG tablet TAKE 1 TABLET BY MOUTH DAILY  30 tablet  5   No current facility-administered medications on file prior to visit.    Review of Systems Patient denies any headache, lightheadedness or dizziness.  No significant sinus or allergy symptoms.   Some minimal congestion.  Using flonase.  No chest pain, tightness or palpitations.  No increased shortness of breath, cough or congestion.  No nausea or vomiting.  No acid reflux.  No abdominal pain or cramping.  No bowel change, such as diarrhea, constipation, BRBPR or melana.  No urine change.  Overall feels she is doing well.       Objective:   Physical  Exam  Filed Vitals:   10/01/13 0750  BP: 110/80  Pulse: 87  Temp: 98 F (36.7 C)   Blood pressure recheck:  10/51  68 year old female in no acute distress.   HEENT:  Nares- clear.  Oropharynx - without lesions. NECK:  Supple.  Nontender.  No audible bruit.  HEART:  Appears to be regular. LUNGS:  No crackles or wheezing audible.  Respirations even and unlabored.  RADIAL PULSE:  Equal bilaterally. ABDOMEN:  Soft, nontender.  Bowel sounds present and normal.  No audible abdominal bruit.   EXTREMITIES:  No increased edema present.  DP pulses palpable and equal bilaterally.           Assessment & Plan:  ABNORMAL LIVER ENZYMES.   Will repeat with next labs.  Previous abdominal ultrasound revealed no significant abnormality of the liver.  Follow.   GYN.  Has been seeing Dr Deatra Ina.  Up to date.  Was getting her breast exams, pelvic exams and mammograms through gyn.  Plans to start getting her physicals here.  Schedule.    MSK.  S/P laparoscopic shoulder surgery.  Doing well.  Continues to follow up with Dr Collier Salina.   HEALTH  MAINTENANCE.  Breast, pelvic and pap smears have been done through Dr Kelby Fam office. Wants to start getting her physicals here.  Will schedule.  She declined prevnar.  Mammogram 04/17/13 - Birads I.  Had colonoscopy.  States up to date.  Will see if can obtain results.  IFOB negative 05/28/13.

## 2013-10-05 ENCOUNTER — Encounter: Payer: Self-pay | Admitting: Internal Medicine

## 2013-10-05 NOTE — Assessment & Plan Note (Signed)
Low cholesterol diet and exercise.  Follow.   

## 2013-10-05 NOTE — Assessment & Plan Note (Signed)
Repeat with next labs.  Abdominal ultrasound revealed no abnormality of the liver.  Follow.

## 2013-10-05 NOTE — Assessment & Plan Note (Signed)
Uses CPAP.  Doing well. 

## 2013-10-05 NOTE — Assessment & Plan Note (Signed)
Blood pressure as outlined.  Appears to be doing well.  Follow.

## 2013-11-04 ENCOUNTER — Encounter: Payer: Self-pay | Admitting: Internal Medicine

## 2013-12-12 ENCOUNTER — Other Ambulatory Visit: Payer: Self-pay | Admitting: Internal Medicine

## 2013-12-30 ENCOUNTER — Other Ambulatory Visit (INDEPENDENT_AMBULATORY_CARE_PROVIDER_SITE_OTHER): Payer: Medicare Other

## 2013-12-30 DIAGNOSIS — E7800 Pure hypercholesterolemia, unspecified: Secondary | ICD-10-CM

## 2013-12-30 DIAGNOSIS — D649 Anemia, unspecified: Secondary | ICD-10-CM

## 2013-12-30 DIAGNOSIS — R748 Abnormal levels of other serum enzymes: Secondary | ICD-10-CM

## 2013-12-30 DIAGNOSIS — R030 Elevated blood-pressure reading, without diagnosis of hypertension: Secondary | ICD-10-CM

## 2013-12-30 DIAGNOSIS — IMO0001 Reserved for inherently not codable concepts without codable children: Secondary | ICD-10-CM

## 2013-12-30 LAB — CBC WITH DIFFERENTIAL/PLATELET
BASOS PCT: 0.5 % (ref 0.0–3.0)
Basophils Absolute: 0 10*3/uL (ref 0.0–0.1)
EOS PCT: 4.9 % (ref 0.0–5.0)
Eosinophils Absolute: 0.2 10*3/uL (ref 0.0–0.7)
HCT: 39.3 % (ref 36.0–46.0)
HEMOGLOBIN: 13 g/dL (ref 12.0–15.0)
LYMPHS PCT: 35 % (ref 12.0–46.0)
Lymphs Abs: 1.8 10*3/uL (ref 0.7–4.0)
MCHC: 33 g/dL (ref 30.0–36.0)
MCV: 89.1 fl (ref 78.0–100.0)
MONOS PCT: 5.8 % (ref 3.0–12.0)
Monocytes Absolute: 0.3 10*3/uL (ref 0.1–1.0)
Neutro Abs: 2.8 10*3/uL (ref 1.4–7.7)
Neutrophils Relative %: 53.8 % (ref 43.0–77.0)
Platelets: 159 10*3/uL (ref 150.0–400.0)
RBC: 4.41 Mil/uL (ref 3.87–5.11)
RDW: 13.7 % (ref 11.5–15.5)
WBC: 5.1 10*3/uL (ref 4.0–10.5)

## 2013-12-30 LAB — BASIC METABOLIC PANEL
BUN: 15 mg/dL (ref 6–23)
CHLORIDE: 105 meq/L (ref 96–112)
CO2: 26 meq/L (ref 19–32)
Calcium: 9.7 mg/dL (ref 8.4–10.5)
Creatinine, Ser: 0.8 mg/dL (ref 0.4–1.2)
GFR: 73.85 mL/min (ref >60.00)
GLUCOSE: 101 mg/dL — AB (ref 70–99)
Potassium: 4.1 mEq/L (ref 3.5–5.1)
SODIUM: 137 meq/L (ref 135–145)

## 2013-12-30 LAB — HEPATIC FUNCTION PANEL
ALBUMIN: 4.2 g/dL (ref 3.5–5.2)
ALK PHOS: 61 U/L (ref 39–117)
ALT: 52 U/L — ABNORMAL HIGH (ref 0–35)
AST: 54 U/L — ABNORMAL HIGH (ref 0–37)
Bilirubin, Direct: 0.1 mg/dL (ref 0.0–0.3)
TOTAL PROTEIN: 7.1 g/dL (ref 6.0–8.3)
Total Bilirubin: 0.8 mg/dL (ref 0.2–1.2)

## 2013-12-30 LAB — LIPID PANEL
CHOL/HDL RATIO: 4
Cholesterol: 180 mg/dL (ref 0–200)
HDL: 40.6 mg/dL (ref >39.00)
LDL Cholesterol: 112 mg/dL — ABNORMAL HIGH (ref 0–99)
NonHDL: 139.4
Triglycerides: 139 mg/dL (ref 0.0–149.0)
VLDL: 27.8 mg/dL (ref 0.0–40.0)

## 2013-12-30 LAB — FERRITIN: Ferritin: 18.9 ng/mL (ref 10.0–291.0)

## 2013-12-31 ENCOUNTER — Encounter: Payer: Self-pay | Admitting: Internal Medicine

## 2014-01-02 NOTE — Telephone Encounter (Signed)
Unread mychart message mailed to patient 

## 2014-01-06 ENCOUNTER — Encounter: Payer: Self-pay | Admitting: Internal Medicine

## 2014-01-06 ENCOUNTER — Ambulatory Visit (INDEPENDENT_AMBULATORY_CARE_PROVIDER_SITE_OTHER): Payer: Medicare Other | Admitting: Internal Medicine

## 2014-01-06 VITALS — BP 120/80 | HR 87 | Temp 98.6°F | Ht 65.5 in | Wt 158.0 lb

## 2014-01-06 DIAGNOSIS — Z9109 Other allergy status, other than to drugs and biological substances: Secondary | ICD-10-CM

## 2014-01-06 DIAGNOSIS — R030 Elevated blood-pressure reading, without diagnosis of hypertension: Secondary | ICD-10-CM

## 2014-01-06 DIAGNOSIS — IMO0001 Reserved for inherently not codable concepts without codable children: Secondary | ICD-10-CM

## 2014-01-06 DIAGNOSIS — E7800 Pure hypercholesterolemia, unspecified: Secondary | ICD-10-CM

## 2014-01-06 DIAGNOSIS — R945 Abnormal results of liver function studies: Secondary | ICD-10-CM

## 2014-01-06 DIAGNOSIS — G4733 Obstructive sleep apnea (adult) (pediatric): Secondary | ICD-10-CM

## 2014-01-06 DIAGNOSIS — Z91048 Other nonmedicinal substance allergy status: Secondary | ICD-10-CM

## 2014-01-06 DIAGNOSIS — R7989 Other specified abnormal findings of blood chemistry: Secondary | ICD-10-CM

## 2014-01-06 DIAGNOSIS — R748 Abnormal levels of other serum enzymes: Secondary | ICD-10-CM

## 2014-01-06 LAB — HEPATIC FUNCTION PANEL
ALBUMIN: 4.5 g/dL (ref 3.5–5.2)
ALT: 49 U/L — ABNORMAL HIGH (ref 0–35)
AST: 50 U/L — AB (ref 0–37)
Alkaline Phosphatase: 62 U/L (ref 39–117)
Bilirubin, Direct: 0.1 mg/dL (ref 0.0–0.3)
Total Bilirubin: 0.6 mg/dL (ref 0.2–1.2)
Total Protein: 7.3 g/dL (ref 6.0–8.3)

## 2014-01-06 MED ORDER — FLUTICASONE PROPIONATE 50 MCG/ACT NA SUSP: 2.0000 | Freq: Every day | NASAL | Status: DC

## 2014-01-06 MED ORDER — PRAVASTATIN SODIUM 10 MG PO TABS: 10.0000 mg | ORAL_TABLET | Freq: Every day | ORAL | Status: DC

## 2014-01-06 NOTE — Progress Notes (Signed)
Subjective:    Patient ID: Dawn Wolfe, female    DOB: 1945-10-01, 68 y.o.   MRN: [id number]  HPI 68 year old female with past history of hypercholesterolemia who comes in today to follow up on this as well as for a complete physical exam.  She has been diagnosed with sleep apnea.  Uses CPAP every night.  Tolerates well.  Stays active.  No chest pain or tightness with increased activity or exertion.   No acid reflux reported.  No abdominal pain or cramping.  No bowel change.  Recently found to have slightly elevated liver enzymes.  Have been elevated previously, but prior checks had normalized.  Overall she feels good.      Past Medical History  Diagnosis Date  . Obstructive sleep apnea on CPAP 2007  . History of colonoscopy 2003    Normal, Dr. Sammuel Cooper, [place]   . Normal exercise sestamibi stress test 2008    Ken Fath  . Rotator cuff tear, left   . Hypercholesterolemia     Current Outpatient Prescriptions on File Prior to Visit  Medication Sig Dispense Refill  . aspirin 81 MG tablet Take 81 mg by mouth daily.     . Calcium Carbonate-Vitamin D (CALCIUM + D) 600-200 MG-UNIT TABS Take by mouth.     . fish oil-omega-3 fatty acids 1000 MG capsule Take 2 g by mouth daily.     . Multiple Vitamin (MULTIVITAMIN) tablet Take 1 tablet by mouth daily.     . niacin 500 MG tablet Take 500 mg by mouth 2 (two) times daily with a meal.     . pravastatin (PRAVACHOL) 10 MG tablet TAKE ONE TABLET BY MOUTH EVERY DAY 30 tablet 5   No current facility-administered medications on file prior to visit.    Review of Systems Patient denies any headache, lightheadedness or dizziness.  No significant sinus or allergy symptoms.   Using flonase.  Request refill.  No chest pain, tightness or palpitations.  No increased shortness of breath, cough or congestion.  No nausea or vomiting.  No acid reflux.  No abdominal pain or cramping.  No bowel change, such as diarrhea, constipation, BRBPR or melana.  No urine  change.  Overall feels she is doing well.  States up to date with colonoscopy.       Objective:   Physical Exam  Filed Vitals:   01/06/14 1334  BP: 120/80  Pulse: 87  Temp: 98.6 F (84 C)   68 year old female in no acute distress.   HEENT:  Nares- clear.  Oropharynx - without lesions. NECK:  Supple.  Nontender.  No audible bruit.  HEART:  Appears to be regular. LUNGS:  No crackles or wheezing audible.  Respirations even and unlabored.  RADIAL PULSE:  Equal bilaterally.    BREASTS:  No nipple discharge or nipple retraction present.  Could not appreciate any distinct nodules or axillary adenopathy.  ABDOMEN:  Soft, nontender.  Bowel sounds present and normal.  No audible abdominal bruit.  GU:  Not performed.     EXTREMITIES:  No increased edema present.  DP pulses palpable and equal bilaterally.        Assessment & Plan:  1. Abnormal liver function tests Found on recent lab.  Recheck liver panel today.  Previous abdominal ultrasound revealed no acute abnormality.   - Hepatic function panel  2. Obstructive sleep apnea Uses CPAP.   3. Hypercholesteremia On pravastatin and tolerating.  Low cholesterol diet.  Follow lipid  panel and liver function tests.  Lab Results  Component Value Date   CHOL 180 12/30/2013   HDL 40.60 12/30/2013   LDLCALC 112* 12/30/2013   LDLDIRECT 129.7 11/15/2011   TRIG 139.0 12/30/2013   CHOLHDL 4 12/30/2013   4. Elevated blood pressure Blood pressure appears to be doing well.  Follow.    5. Environmental allergies Controlled with flonase.    6. ABNORMAL LIVER ENZYMES.   Will repeat today.  Previous abdominal ultrasound revealed no significant abnormality of the liver.  Follow.   7. GYN.  Had been seeing Dr Deatra Ina.  Was getting her breast exams, pelvic exams and mammograms through gyn.  Physicals here now. Declines need for pap.    8. MSK.  S/P laparoscopic shoulder surgery.  Doing well.  Continues to follow up with Dr Collier Salina.   HEALTH  MAINTENANCE.  Breast, pelvic and pap smears have been done through Dr Kelby Fam office.  Physical today.   She declined prevnar.  Mammogram 04/17/13 - Birads I.  Had colonoscopy.  States up to date.  States performed by Dr Wynetta Emery at [facility].   IFOB negative 05/28/13.

## 2014-01-06 NOTE — Progress Notes (Signed)
Pre visit review using our clinic review tool, if applicable. No additional management support is needed unless otherwise documented below in the visit note. 

## 2014-01-07 ENCOUNTER — Encounter: Payer: Self-pay | Admitting: Internal Medicine

## 2014-01-07 ENCOUNTER — Telehealth: Payer: Self-pay | Admitting: Internal Medicine

## 2014-01-07 DIAGNOSIS — R945 Abnormal results of liver function studies: Secondary | ICD-10-CM

## 2014-01-07 DIAGNOSIS — R7989 Other specified abnormal findings of blood chemistry: Secondary | ICD-10-CM

## 2014-01-07 NOTE — Telephone Encounter (Signed)
Pt notified of lab results via my chart.  Notified needs non fasting lab in a few weeks.  Please schedule and contact pt with a lab appt date and time.  Thanks.

## 2014-01-12 ENCOUNTER — Encounter: Payer: Self-pay | Admitting: Internal Medicine

## 2014-01-12 DIAGNOSIS — Z9109 Other allergy status, other than to drugs and biological substances: Secondary | ICD-10-CM | POA: Insufficient documentation

## 2014-01-21 ENCOUNTER — Encounter: Payer: Self-pay | Admitting: Internal Medicine

## 2014-01-21 ENCOUNTER — Other Ambulatory Visit (INDEPENDENT_AMBULATORY_CARE_PROVIDER_SITE_OTHER): Payer: Medicare Other

## 2014-01-21 DIAGNOSIS — R945 Abnormal results of liver function studies: Secondary | ICD-10-CM

## 2014-01-21 DIAGNOSIS — R7989 Other specified abnormal findings of blood chemistry: Secondary | ICD-10-CM

## 2014-01-21 DIAGNOSIS — K8020 Calculus of gallbladder without cholecystitis without obstruction: Secondary | ICD-10-CM

## 2014-01-21 LAB — HEPATIC FUNCTION PANEL
ALT: 54 U/L — ABNORMAL HIGH (ref 0–35)
AST: 57 U/L — AB (ref 0–37)
Albumin: 4.3 g/dL (ref 3.5–5.2)
Alkaline Phosphatase: 66 U/L (ref 39–117)
BILIRUBIN TOTAL: 1.2 mg/dL (ref 0.2–1.2)
Bilirubin, Direct: 0.1 mg/dL (ref 0.0–0.3)
TOTAL PROTEIN: 7.1 g/dL (ref 6.0–8.3)

## 2014-01-22 NOTE — Telephone Encounter (Signed)
Order placed for abdominal ultrasound.   

## 2014-01-28 ENCOUNTER — Ambulatory Visit: Payer: Self-pay | Admitting: Internal Medicine

## 2014-02-04 NOTE — Telephone Encounter (Signed)
Order placed for referral to surgery.  

## 2014-02-04 NOTE — Addendum Note (Signed)
Addended by: Alisa Graff on: 02/04/2014 01:13 PM   Modules accepted: Orders

## 2014-02-05 ENCOUNTER — Encounter: Payer: Self-pay | Admitting: Internal Medicine

## 2014-02-08 ENCOUNTER — Other Ambulatory Visit: Payer: Self-pay | Admitting: Internal Medicine

## 2014-02-08 DIAGNOSIS — E2839 Other primary ovarian failure: Secondary | ICD-10-CM

## 2014-02-08 NOTE — Progress Notes (Signed)
Order placed for none density.

## 2014-02-18 ENCOUNTER — Encounter: Payer: Self-pay | Admitting: Internal Medicine

## 2014-02-24 ENCOUNTER — Ambulatory Visit (INDEPENDENT_AMBULATORY_CARE_PROVIDER_SITE_OTHER): Payer: Medicare Other | Admitting: General Surgery

## 2014-02-24 ENCOUNTER — Encounter: Payer: Self-pay | Admitting: General Surgery

## 2014-02-24 VITALS — BP 128/70 | HR 70 | Resp 12 | Ht 65.0 in | Wt 159.0 lb

## 2014-02-24 DIAGNOSIS — K8020 Calculus of gallbladder without cholecystitis without obstruction: Secondary | ICD-10-CM

## 2014-02-24 NOTE — Patient Instructions (Addendum)
The patient is aware to call back for any questions or concerns.  Cholelithiasis Cholelithiasis (also called gallstones) is a form of gallbladder disease. The gallbladder is a small organ that helps you digest fats. Symptoms of gallstones are:  Feeling sick to your stomach (nausea).  Throwing up (vomiting).  Belly pain.  Yellowing of the skin (jaundice).  Sudden pain. You may feel the pain for minutes to hours.  Fever.  Pain to the touch. HOME CARE  Only take medicines as told by your doctor.  Eat a low-fat diet until you see your doctor again. Eating fat can result in pain.  Follow up with your doctor as told. Attacks usually happen time after time. Surgery is usually needed for permanent treatment. GET HELP RIGHT AWAY IF:   Your pain gets worse.  Your pain is not helped by medicines.  You have a fever and lasting symptoms for more than 2-3 days.  You have a fever and your symptoms suddenly get worse.  You keep feeling sick to your stomach and throwing up. MAKE SURE YOU:   Understand these instructions.  Will watch your condition.  Will get help right away if you are not doing well or get worse. Document Released: 06/21/2007 Document Revised: 09/04/2012 Document Reviewed: 06/26/2012 [facility] Patient Information 2015 [place], [place]. This information is not intended to replace advice given to you by your health care provider. Make sure you discuss any questions you have with your health care provider.

## 2014-02-24 NOTE — Progress Notes (Signed)
Patient ID: Dawn Wolfe, female   DOB: [birth year], 69 y.o.   MRN: [id number]  Chief Complaint  Patient presents with  . Other    gallstones    HPI Dawn Wolfe is a 69 y.o. female here today for a evalution of gallstones. Patient had an ultrasound done on 01/28/14 evaluating her liver and and gall stones were found.  She does admit to being "bloated" after eating. Of note, she was not aware of this symptom until being notified that she had gallstones. No nausea or vomiting. She does have elevated liver function studies which has been that way for several years which prompted her most recent ultrasound. She avoids spicy foods because of preference. She has had epigastric pain a couple of weeks ago but none since. She states her indigestion is described as "not often".  She mostly complains of "bloating" for about 6 weeks.  She is here today with her husband of 38 years, Legrand Como, who was present for the interview and exam.  HPI  Past Medical History  Diagnosis Date  . Obstructive sleep apnea on CPAP 2007  . History of colonoscopy 2003    Normal, Dr. Sammuel Cooper, [place]   . Normal exercise sestamibi stress test 2008    Ken Fath  . Rotator cuff tear, left   . Hypercholesterolemia   . Sleep apnea   . Cancer 15 yrs ago    [facility] on the face. Surgical removal  . Hemorrhoids     Past Surgical History  Procedure Laterality Date  . Abdominalplasty w/ liposuction  AGE 48  . Nasal septum surgery  AGE 45  . Nasal sinus surgery  AGE 51  . Bunionectomy  X3  LAST ONE 2003  . Shoulder arthroscopy  10/25/2011    Procedure: ARTHROSCOPY SHOULDER;  Surgeon: Magnus Sinning, MD;  Location: [facility];  Service: Orthopedics;  Laterality: Left;  with SAD, shave of the labrium   . Tubal ligation  1982    Family History  Problem Relation Age of Onset  . Acute myelogenous leukemia Sister   . Polycythemia Sister 65  . Emphysema Father     smoker  . Heart disease Father      myocardial infarction - 51  . Breast cancer Paternal Aunt   . Colon cancer Neg Hx   . Testicular cancer Brother   . Heart disease Mother   . Leukemia Sister     Social History History  Substance Use Topics  . Smoking status: Former Smoker -- 2.50 packs/day for 10 years    Types: Cigarettes    Quit date: 10/26/1978  . Smokeless tobacco: Never Used  . Alcohol Use: No    Allergies  Allergen Reactions  . Codeine Nausea And Vomiting    Current Outpatient Prescriptions  Medication Sig Dispense Refill  . aspirin 81 MG tablet Take 81 mg by mouth daily.     . Calcium Carbonate-Vitamin D (CALCIUM + D) 600-200 MG-UNIT TABS Take by mouth.     . Coenzyme Q10 (CO Q 10) 100 MG CAPS Take 100 mg by mouth daily.    . fish oil-omega-3 fatty acids 1000 MG capsule Take 2 g by mouth daily.     . fluticasone (FLONASE) 50 MCG/ACT nasal spray Place 2 sprays into both nostrils daily. 16 g 3  . Multiple Vitamin (MULTIVITAMIN) tablet Take 1 tablet by mouth daily.     . niacin 500 MG tablet Take 500 mg by mouth 2 (two)  times daily with a meal.     . pravastatin (PRAVACHOL) 10 MG tablet Take 1 tablet (10 mg total) by mouth daily. 30 tablet 3   No current facility-administered medications for this visit.    Review of Systems Review of Systems  Constitutional: Negative.   Respiratory: Negative.   Cardiovascular: Negative.   Gastrointestinal: Positive for diarrhea, constipation and abdominal distention. Negative for nausea and vomiting.    Blood pressure 128/70, pulse 70, resp. rate 12, height 5\' 5"  (1.651 m), weight 159 lb (72.122 kg).  Physical Exam Physical Exam  Constitutional: She is oriented to person, place, and time. She appears well-developed and well-nourished.  Neck: Neck supple.  Cardiovascular: Normal rate, regular rhythm and normal heart sounds.   Pulmonary/Chest: Effort normal and breath sounds normal.  Abdominal: Soft. Normal appearance. There is no hepatosplenomegaly. There  is no tenderness.    Lymphadenopathy:    She has no cervical adenopathy.  Neurological: She is alert and oriented to person, place, and time.  Skin: Skin is warm and dry.    Data Reviewed Abdominal ultrasound of 11/23/2011 in 01/28/2014 were reviewed. Both studies suggest a contracted gallbladder with multiple stones. No gallbladder wall thickening. Normal common bile duct No abnormality of the liver. Previously identified exophytic mass in the left kidney has been shown by subsequent ultrasound represent a cyst.  Assessment    Asymptomatic cholelithiasis.  Mild elevation of serum transaminases.    Plan    At this time there is no indication for elective cholecystectomy. Should the patient developed postprandial pain or show a rise in bilirubin/alkaline phosphatase on serial LFT testing elective cholecystectomy could be considered.    Follow up as needed. Reviewed signs and symptoms to monitor.     PCP:  Nash Shearer 02/25/2014, 7:15 AM

## 2014-02-25 DIAGNOSIS — K8020 Calculus of gallbladder without cholecystitis without obstruction: Secondary | ICD-10-CM | POA: Insufficient documentation

## 2014-03-03 ENCOUNTER — Telehealth: Payer: Self-pay | Admitting: General Surgery

## 2014-03-03 NOTE — Telephone Encounter (Signed)
Patient had called wanting to schedule surgery. At the time of her visit on Feb 24, 2014, it appeared that her gallstone were asymptomatic. She has been having right shoulder pain, similar to what she had been having in the left shoulder the last few months. This appears to be activity related. She at present has no notable dietary intolerance, nor any clearly associated post prandial bloating. Will put cholecystectomy on the back burner for the present.  She was encouraged to call if she had any change/ development of post prandial symptoms, and that she should not wait until things were "severe" prior to presenting for reassessment.

## 2014-03-03 NOTE — Telephone Encounter (Signed)
03-03-13 PT CALLED & STATES SHE DOES WANT TO HAVE THE CHOLECYSTECTOMY. I EXPLAINED EITHER CARYL-LYN OR MICHELE WOULD CALL HER BACK & SCHEDULE ONCE THEY RECEIVE SURGERY SCHEDULING  SHEET.

## 2014-03-25 ENCOUNTER — Encounter: Payer: Self-pay | Admitting: Internal Medicine

## 2014-04-20 ENCOUNTER — Ambulatory Visit
Admit: 2014-04-20 | Disposition: A | Discharge status: Home / Self Care | Payer: Self-pay | Attending: Internal Medicine | Admitting: Internal Medicine

## 2014-04-20 LAB — HM MAMMOGRAPHY: HM Mammogram: NEGATIVE

## 2014-05-12 ENCOUNTER — Encounter: Payer: Self-pay | Admitting: Internal Medicine

## 2014-06-26 ENCOUNTER — Other Ambulatory Visit: Payer: Self-pay | Admitting: Internal Medicine

## 2014-06-26 DIAGNOSIS — IMO0001 Reserved for inherently not codable concepts without codable children: Secondary | ICD-10-CM

## 2014-06-26 DIAGNOSIS — E7800 Pure hypercholesterolemia, unspecified: Secondary | ICD-10-CM

## 2014-06-26 DIAGNOSIS — R030 Elevated blood-pressure reading, without diagnosis of hypertension: Secondary | ICD-10-CM

## 2014-06-26 DIAGNOSIS — R748 Abnormal levels of other serum enzymes: Secondary | ICD-10-CM

## 2014-06-26 DIAGNOSIS — G4733 Obstructive sleep apnea (adult) (pediatric): Secondary | ICD-10-CM

## 2014-06-26 NOTE — Telephone Encounter (Signed)
^   month since last Lipid ok to fill?

## 2014-06-27 NOTE — Telephone Encounter (Signed)
Pt needs a scheduled f/u appt (46min).  Needs fasting labs 1-2 days before f/u appt.  Ok to refill pravastatin until appt.

## 2014-07-03 ENCOUNTER — Other Ambulatory Visit: Payer: Self-pay | Admitting: *Deleted

## 2014-07-03 ENCOUNTER — Encounter: Payer: Self-pay | Admitting: *Deleted

## 2014-07-08 NOTE — Telephone Encounter (Signed)
Unread mychart message mailed to patient 

## 2014-07-13 ENCOUNTER — Other Ambulatory Visit: Payer: Self-pay

## 2014-07-22 ENCOUNTER — Other Ambulatory Visit (INDEPENDENT_AMBULATORY_CARE_PROVIDER_SITE_OTHER): Payer: Medicare Other

## 2014-07-22 ENCOUNTER — Encounter: Payer: Self-pay | Admitting: Internal Medicine

## 2014-07-22 DIAGNOSIS — R030 Elevated blood-pressure reading, without diagnosis of hypertension: Secondary | ICD-10-CM

## 2014-07-22 DIAGNOSIS — R748 Abnormal levels of other serum enzymes: Secondary | ICD-10-CM

## 2014-07-22 DIAGNOSIS — E7800 Pure hypercholesterolemia, unspecified: Secondary | ICD-10-CM

## 2014-07-22 DIAGNOSIS — IMO0001 Reserved for inherently not codable concepts without codable children: Secondary | ICD-10-CM

## 2014-07-22 LAB — BASIC METABOLIC PANEL
BUN: 13 mg/dL (ref 6–23)
CHLORIDE: 104 meq/L (ref 96–112)
CO2: 29 meq/L (ref 19–32)
CREATININE: 0.86 mg/dL (ref 0.40–1.20)
Calcium: 9.8 mg/dL (ref 8.4–10.5)
GFR: 69.78 mL/min (ref >60.00)
GLUCOSE: 99 mg/dL (ref 70–99)
POTASSIUM: 4.1 meq/L (ref 3.5–5.1)
Sodium: 140 mEq/L (ref 135–145)

## 2014-07-22 LAB — HEPATIC FUNCTION PANEL
ALK PHOS: 70 U/L (ref 39–117)
ALT: 20 U/L (ref 0–35)
AST: 26 U/L (ref 0–37)
Albumin: 4.2 g/dL (ref 3.5–5.2)
BILIRUBIN DIRECT: 0.1 mg/dL (ref 0.0–0.3)
TOTAL PROTEIN: 7.5 g/dL (ref 6.0–8.3)
Total Bilirubin: 0.4 mg/dL (ref 0.2–1.2)

## 2014-07-22 LAB — LIPID PANEL
CHOL/HDL RATIO: 4
Cholesterol: 155 mg/dL (ref 0–200)
HDL: 35.3 mg/dL — ABNORMAL LOW (ref >39.00)
NONHDL: 119.7
Triglycerides: 237 mg/dL — ABNORMAL HIGH (ref 0.0–149.0)
VLDL: 47.4 mg/dL — AB (ref 0.0–40.0)

## 2014-07-22 LAB — LDL CHOLESTEROL, DIRECT: LDL DIRECT: 88 mg/dL

## 2014-07-22 LAB — TSH: TSH: 2.21 u[IU]/mL (ref 0.35–4.50)

## 2014-07-24 NOTE — Telephone Encounter (Signed)
Unread mychart message mailed to patient 

## 2014-08-26 ENCOUNTER — Other Ambulatory Visit: Payer: Self-pay | Admitting: Internal Medicine

## 2014-11-03 ENCOUNTER — Encounter: Payer: Self-pay | Admitting: Internal Medicine

## 2014-11-26 ENCOUNTER — Ambulatory Visit (INDEPENDENT_AMBULATORY_CARE_PROVIDER_SITE_OTHER): Payer: Medicare Other

## 2014-11-26 VITALS — BP 128/72 | HR 86 | Temp 98.0°F | Resp 12 | Ht 65.0 in | Wt 150.0 lb

## 2014-11-26 DIAGNOSIS — Z23 Encounter for immunization: Secondary | ICD-10-CM

## 2014-11-26 DIAGNOSIS — Z1159 Encounter for screening for other viral diseases: Secondary | ICD-10-CM | POA: Diagnosis not present

## 2014-11-26 DIAGNOSIS — Z418 Encounter for other procedures for purposes other than remedying health state: Secondary | ICD-10-CM | POA: Diagnosis not present

## 2014-11-26 DIAGNOSIS — Z0000 Encounter for general adult medical examination without abnormal findings: Secondary | ICD-10-CM | POA: Diagnosis not present

## 2014-11-26 DIAGNOSIS — Z299 Encounter for prophylactic measures, unspecified: Secondary | ICD-10-CM

## 2014-11-26 NOTE — Patient Instructions (Addendum)
Dawn Wolfe,  Thank you for taking time to come for your Medicare Wellness Visit.  I appreciate your ongoing commitment to your health goals. Please review the following plan we discussed and let me know if I can assist you in the future.  Prevnar 13 administered today  Hep C Screening completed today  Return for scheduled physical with PCP Glaucoma Glaucoma happens when the fluid pressure in the eyeball is too high. If the pressure stays high for too long, the eye may become damaged. This can cause a loss of vision. The most common type of glaucoma causes pressure in the eye to go up slowly. There may be no symptoms at first. Testing for this condition can help to find the condition before damage occurs. Early treatment can often stop vision loss. HOME CARE  Take medicines only as told by your doctor.  Use your eye drops exactly as told. You will probably need to use these for the rest of your life.  Exercise often. Talk with your doctor about which types of exercise are safe for you. Avoid standing on your head.  Keep all follow-up visits as told by your doctor. This is important. GET HELP IF:  Your symptoms get worse. GET HELP RIGHT AWAY IF:  You have bad pain in your eye.  You have vision problems.  You have a bad headache in the area around your eye.  You feel sick to your stomach (nauseous) or you throw up (vomit).  You start to have problems with your other eye.   This information is not intended to replace advice given to you by your health care provider. Make sure you discuss any questions you have with your health care provider.   Document Released: 10/12/2007 Document Revised: 01/23/2014 Document Reviewed: 10/14/2013 Elsevier Interactive Patient Education [employer].

## 2014-11-26 NOTE — Progress Notes (Signed)
Subjective:   Dawn Wolfe is a 69 y.o. female who presents for an Initial Medicare Annual Wellness Visit.  Review of Systems    No ROS.  Medicare Wellness Visit.   Cardiac Risk Factors include: advanced age (>29men, >62 women)     Objective:    Today's Vitals   11/26/14 0900  BP: 128/72  Pulse: 86  Temp: 98 F (36.7 C)  TempSrc: Oral  Resp: 12  Height: 5\' 5"  (1.651 m)  Weight: 150 lb (68.04 kg)  SpO2: 97%    Current Medications (verified) Outpatient Encounter Prescriptions as of 11/26/2014  Medication Sig  . aspirin 81 MG tablet Take 81 mg by mouth daily.   . Calcium Carbonate-Vitamin D (CALCIUM + D) 600-200 MG-UNIT TABS Take by mouth.   . fish oil-omega-3 fatty acids 1000 MG capsule Take 2 g by mouth daily.   . fluticasone (FLONASE) 50 MCG/ACT nasal spray Place 2 sprays into both nostrils daily.  . Multiple Vitamin (MULTIVITAMIN) tablet Take 1 tablet by mouth daily.   . Multiple Vitamins-Minerals (EYE VITAMINS) CAPS Take 1 capsule by mouth 2 (two) times daily at 10 AM and 5 PM.   . niacin 500 MG tablet Take 500 mg by mouth 2 (two) times daily with a meal.   . pravastatin (PRAVACHOL) 10 MG tablet TAKE ONE TABLET BY MOUTH EVERY DAY  . [DISCONTINUED] Coenzyme Q10 (CO Q 10) 100 MG CAPS Take 100 mg by mouth daily.   No facility-administered encounter medications on file as of 11/26/2014.    Allergies (verified) Codeine   History: Past Medical History  Diagnosis Date  . Obstructive sleep apnea on CPAP 2007  . History of colonoscopy 2003    Normal, Dr. Sammuel Cooper, [place]   . Normal exercise sestamibi stress test 2008    Ken Fath  . Rotator cuff tear, left   . Hypercholesterolemia   . Sleep apnea   . Cancer ([place]) 15 yrs ago    [employer] on the face. Surgical removal  . Hemorrhoids    Past Surgical History  Procedure Laterality Date  . Abdominalplasty w/ liposuction  AGE 78  . Nasal septum surgery  AGE 53  . Nasal sinus surgery  AGE 44  . Bunionectomy   X3  LAST ONE 2003  . Shoulder arthroscopy  10/25/2011    Procedure: ARTHROSCOPY SHOULDER;  Surgeon: Magnus Sinning, MD;  Location: [facility];  Service: Orthopedics;  Laterality: Left;  with SAD, shave of the labrium   . Tubal ligation  1982   Family History  Problem Relation Age of Onset  . Acute myelogenous leukemia Sister   . Polycythemia Sister 10  . Emphysema Father     smoker  . Heart disease Father     myocardial infarction - 48  . Breast cancer Paternal Aunt   . Colon cancer Neg Hx   . Testicular cancer Brother   . Heart disease Mother   . Emphysema Mother   . Leukemia Sister    Social History   Occupational History  . Not on file.   Social History Main Topics  . Smoking status: Former Smoker -- 2.50 packs/day for 10 years    Types: Cigarettes    Quit date: 10/26/1978  . Smokeless tobacco: Never Used  . Alcohol Use: No  . Drug Use: No  . Sexual Activity: No    Tobacco Counseling Counseling given: Not Answered   Activities of Daily Living In your present state of  health, do you have any difficulty performing the following activities: 11/26/2014 01/06/2014  Hearing? N N  Vision? N N  Difficulty concentrating or making decisions? N N  Walking or climbing stairs? N N  Dressing or bathing? N N  Doing errands, shopping? N N  Preparing Food and eating ? N -  Using the Toilet? N -  In the past six months, have you accidently leaked urine? N -  Do you have problems with loss of bowel control? N -  Managing your Medications? N -  Managing your Finances? N -  Housekeeping or managing your Housekeeping? N -    Immunizations and Health Maintenance Immunization History  Administered Date(s) Administered  . Influenza Split 10/26/2010, 10/16/2011, 10/21/2012, 08/22/2013  . Influenza-Unspecified 10/08/2014  . Pneumococcal Conjugate-13 11/26/2014  . Zoster 04/10/2013   There are no preventive care reminders to display for this  patient.  Patient Care Team: Einar Pheasant, MD as PCP - General (Internal Medicine) Einar Pheasant, MD (Internal Medicine) Robert Bellow, MD (General Surgery)  Indicate any recent Medical Services you may have received from other than [facility] providers in the past year (date may be approximate).     Assessment:   This is a routine wellness examination for Dawn Wolfe. The goal of the wellness visit is to assist the patient how to close the gaps in care and create a preventative care plan for the patient.   Medications reviewed; taking as prescribed, no barriers identified.  Bone Density reviewed; taking Calcium as appropriate, Osteoporosis discussed.  Dexa Scan postponed, per patient; follow up with PCP at a later date.  TDAP vaccine discussed;  postponed, per patient; follow up with insurance Plan D for coverage.   Prevnar 13 vaccine administered today.   Hep C Screening completed today.  Safety issues reviewed; smoke detectors in the home. Firearms locked in a secure area. Wears seatbelts when driving or riding with others. No violence in the home.  The patient was oriented x 3; appropriate in dress and manner and no objective failures at ADL's or IADL's.   Patient concerns:  None at this time.  Hearing/Vision screen  Hearing Screening   125Hz  250Hz  500Hz  1000Hz  2000Hz  4000Hz  8000Hz   Right ear:   Pass Pass Pass Pass   Left ear:   Pass Pass Pass Pass   Vision Screening Comments: Followed by [facility] q 6 months Recent Dx of non-ripe glaucoma Wears glasses  Dietary issues and exercise activities discussed: Current Exercise Habits:: The patient does not participate in regular exercise at present  Goals    . Increase physical activity     Patient centered goal is to walk the treadmill at home 5 days a week as tolerated up to 20 minutes.      Depression Screen PHQ 2/9 Scores 11/26/2014 01/06/2014 03/28/2013 04/03/2012 11/16/2011 11/15/2011  PHQ - 2 Score 0 0 0 0 0  0    Fall Risk Fall Risk  11/26/2014 01/06/2014 03/28/2013 04/03/2012  Falls in the past year? No No Yes No  Number falls in past yr: - - 1 -  Injury with Fall? - - Yes -    Cognitive Function: MMSE - Mini Mental State Exam 11/26/2014  Orientation to time 5  Orientation to Place 5  Registration 3  Attention/ Calculation 5  Recall 3  Language- name 2 objects 2  Language- repeat 1  Language- follow 3 step command 3  Language- read & follow direction 1  Write a sentence 1  Copy design 1  Total score 30    Screening Tests Health Maintenance  Topic Date Due  . DEXA SCAN  01/29/2015 (Originally 09/28/2011)  . TETANUS/TDAP  11/26/2015 (Originally 09/27/1965)  . INFLUENZA VACCINE  08/17/2015  . PNA vac Low Risk Adult (2 of 2 - PPSV23) 11/26/2015  . MAMMOGRAM  04/19/2016  . COLONOSCOPY  02/17/2019  . ZOSTAVAX  Completed  . Hepatitis C Screening  Completed      Plan:   End of life planning; Advance aging; Advanced directives discussed. Copy requested of HCPOA/LivingWill form at follow up visit.   During the course of the visit, Dawn Wolfe was educated and counseled about the following appropriate screening and preventive services:   Vaccines to include Pneumoccal, Influenza, Hepatitis B, Td, Zostavax, HCV  Electrocardiogram  Cardiovascular disease screening  Colorectal cancer screening  Bone density screening  Diabetes screening  Glaucoma screening  Mammography/PAP  Nutrition counseling  Smoking cessation counseling  Patient Instructions (the written plan) were given to the patient.    Varney Biles, LPN   [id number]    Reviewed above information.  Agree with plan.    Dr Nicki Reaper

## 2014-11-27 ENCOUNTER — Encounter: Payer: Self-pay | Admitting: Internal Medicine

## 2014-11-27 LAB — HEPATITIS C ANTIBODY: HCV AB: NEGATIVE

## 2015-01-08 ENCOUNTER — Encounter: Payer: Medicare Other | Admitting: Internal Medicine

## 2015-02-01 ENCOUNTER — Encounter: Payer: Self-pay | Admitting: Internal Medicine

## 2015-02-01 ENCOUNTER — Ambulatory Visit (INDEPENDENT_AMBULATORY_CARE_PROVIDER_SITE_OTHER): Payer: Medicare Other | Admitting: Internal Medicine

## 2015-02-01 VITALS — BP 120/80 | HR 95 | Temp 97.9°F | Resp 18 | Ht 65.75 in | Wt 151.0 lb

## 2015-02-01 DIAGNOSIS — IMO0001 Reserved for inherently not codable concepts without codable children: Secondary | ICD-10-CM

## 2015-02-01 DIAGNOSIS — R030 Elevated blood-pressure reading, without diagnosis of hypertension: Secondary | ICD-10-CM | POA: Diagnosis not present

## 2015-02-01 DIAGNOSIS — Z0000 Encounter for general adult medical examination without abnormal findings: Secondary | ICD-10-CM | POA: Diagnosis not present

## 2015-02-01 DIAGNOSIS — E7800 Pure hypercholesterolemia, unspecified: Secondary | ICD-10-CM

## 2015-02-01 DIAGNOSIS — G4733 Obstructive sleep apnea (adult) (pediatric): Secondary | ICD-10-CM

## 2015-02-01 DIAGNOSIS — R748 Abnormal levels of other serum enzymes: Secondary | ICD-10-CM

## 2015-02-01 NOTE — Progress Notes (Signed)
Patient ID: Dawn Wolfe, female   DOB: July 14, 1945, 70 y.o.   MRN: [id number]   Subjective:    Patient ID: Dawn Wolfe, female    DOB: [birth year], 70 y.o.   MRN: [id number]  HPI  Patient with past history of OSA and hypercholesterolemia.  She comes in today to follow up on these issues as well as for a complete physical exam.  She stays active.  No cardiac symptoms with increased activity or exertion.  No sob.  No acid reflux.  No abdominal pain or cramping.  Bowels stable.  No urinary issues.     Past Medical History  Diagnosis Date  . Obstructive sleep apnea on CPAP 2007  . History of colonoscopy 2003    Normal, Dr. Sammuel Cooper, [place]   . Normal exercise sestamibi stress test 2008    Ken Fath  . Rotator cuff tear, left   . Hypercholesterolemia   . Sleep apnea   . Cancer ([place]) 15 yrs ago    [employer] on the face. Surgical removal  . Hemorrhoids    Past Surgical History  Procedure Laterality Date  . Abdominalplasty w/ liposuction  AGE 52  . Nasal septum surgery  AGE 58  . Nasal sinus surgery  AGE 71  . Bunionectomy  X3  LAST ONE 2003  . Shoulder arthroscopy  10/25/2011    Procedure: ARTHROSCOPY SHOULDER;  Surgeon: Magnus Sinning, MD;  Location[facility];  Service: Orthopedics;  Laterality: Left;  with SAD, shave of the labrium   . Tubal ligation  1982   Family History  Problem Relation Age of Onset  . Acute myelogenous leukemia Sister   . Polycythemia Sister 80  . Emphysema Father     smoker  . Heart disease Father     myocardial infarction - 27  . Breast cancer Paternal Aunt   . Colon cancer Neg Hx   . Testicular cancer Brother   . Heart disease Mother   . Emphysema Mother   . Leukemia Sister    Social History   Social History  . Marital Status: Married    Spouse Name: N/A  . Number of Children: N/A  . Years of Education: N/A   Social History Main Topics  . Smoking status: Former Smoker -- 2.50 packs/day for 10 years    Types:  Cigarettes    Quit date: 10/26/1978  . Smokeless tobacco: Never Used  . Alcohol Use: No  . Drug Use: No  . Sexual Activity: No   Other Topics Concern  . None   Social History Narrative    Outpatient Encounter Prescriptions as of 02/01/2015  Medication Sig  . aspirin 81 MG tablet Take 81 mg by mouth daily.   . fish oil-omega-3 fatty acids 1000 MG capsule Take 1 g by mouth daily.   . fluticasone (FLONASE) 50 MCG/ACT nasal spray Place 2 sprays into both nostrils daily.  . Multiple Vitamin (MULTIVITAMIN) tablet Take 1 tablet by mouth daily.   . Multiple Vitamins-Minerals (EYE VITAMINS) CAPS Take 1 capsule by mouth 2 (two) times daily at 10 AM and 5 PM.   . pravastatin (PRAVACHOL) 10 MG tablet TAKE ONE TABLET BY MOUTH EVERY DAY  . [DISCONTINUED] Calcium Carbonate-Vitamin D (CALCIUM + D) 600-200 MG-UNIT TABS Take by mouth.   . [DISCONTINUED] niacin 500 MG tablet Take 500 mg by mouth 2 (two) times daily with a meal.    No facility-administered encounter medications on file as of 02/01/2015.  Review of Systems  Constitutional: Negative for appetite change and unexpected weight change.  HENT: Negative for congestion and sinus pressure.   Eyes: Negative for pain and visual disturbance.  Respiratory: Negative for cough, chest tightness and shortness of breath.   Cardiovascular: Negative for chest pain, palpitations and leg swelling.  Gastrointestinal: Negative for nausea, vomiting, abdominal pain and diarrhea.  Genitourinary: Negative for frequency and difficulty urinating.  Musculoskeletal: Negative for back pain and joint swelling.  Skin: Negative for color change and rash.  Neurological: Negative for dizziness, light-headedness and headaches.  Hematological: Negative for adenopathy. Does not bruise/bleed easily.  Psychiatric/Behavioral: Negative for dysphoric mood and agitation.       Objective:    Physical Exam  Constitutional: She is oriented to person, place, and time. She  appears well-developed and well-nourished. No distress.  HENT:  Nose: Nose normal.  Mouth/Throat: Oropharynx is clear and moist.  Eyes: Right eye exhibits no discharge. Left eye exhibits no discharge. No scleral icterus.  Neck: Neck supple. No thyromegaly present.  Cardiovascular: Normal rate and regular rhythm.   Pulmonary/Chest: Breath sounds normal. No accessory muscle usage. No tachypnea. No respiratory distress. She has no decreased breath sounds. She has no wheezes. She has no rhonchi. Right breast exhibits no inverted nipple, no mass, no nipple discharge and no tenderness (no axillary adenopathy). Left breast exhibits no inverted nipple, no mass, no nipple discharge and no tenderness (no axilarry adenopathy).  Abdominal: Soft. Bowel sounds are normal. There is no tenderness.  Musculoskeletal: She exhibits no edema or tenderness.  Lymphadenopathy:    She has no cervical adenopathy.  Neurological: She is alert and oriented to person, place, and time.  Skin: Skin is warm. No rash noted. No erythema.  Psychiatric: She has a normal mood and affect. Her behavior is normal.    BP 120/80 mmHg  Pulse 95  Temp(Src) 97.9 F (36.6 C) (Oral)  Resp 18  Ht 5' 5.75" (1.67 m)  Wt 151 lb (68.493 kg)  BMI 24.56 kg/m2  SpO2 96% Wt Readings from Last 3 Encounters:  02/01/15 151 lb (68.493 kg)  11/26/14 150 lb (68.04 kg)  02/24/14 159 lb (72.122 kg)     Lab Results  Component Value Date   WBC 5.1 12/30/2013   HGB 13.0 12/30/2013   HCT 39.3 12/30/2013   PLT 159.0 12/30/2013   GLUCOSE 99 07/22/2014   CHOL 155 07/22/2014   TRIG 237.0* 07/22/2014   HDL 35.30* 07/22/2014   LDLDIRECT 88.0 07/22/2014   LDLCALC 112* 12/30/2013   ALT 20 07/22/2014   AST 26 07/22/2014   NA 140 07/22/2014   K 4.1 07/22/2014   CL 104 07/22/2014   CREATININE 0.86 07/22/2014   BUN 13 07/22/2014   CO2 29 07/22/2014   TSH 2.21 07/22/2014       Assessment & Plan:   Problem List Items Addressed This Visit      Abnormal liver enzymes    Abdominal ultrasound revealed no acute abnormality of the liver.  Follow liver panel.        Relevant Orders   Hepatic function panel   Basic metabolic panel   Elevated blood pressure    Blood pressure as outlined.  Doing well. Follow.        Health care maintenance    Physical today 02/01/15.  Mammogram 04/20/14 - Birads I.  Colonoscopy 11/16/09.        Hypercholesteremia    Low cholesterol diet and exercise.  Follow lipid panel.  Lab Results  Component Value Date   CHOL 155 07/22/2014   HDL 35.30* 07/22/2014   LDLCALC 112* 12/30/2013   LDLDIRECT 88.0 07/22/2014   TRIG 237.0* 07/22/2014   CHOLHDL 4 07/22/2014        Relevant Orders   CBC with Differential/Platelet   Lipid panel   Obstructive sleep apnea    CPAP.         Other Visit Diagnoses    Routine general medical examination at a health care facility    -  Primary        Einar Pheasant, MD

## 2015-02-01 NOTE — Progress Notes (Signed)
Pre-visit discussion using our clinic review tool. No additional management support is needed unless otherwise documented below in the visit note.  

## 2015-02-02 NOTE — Telephone Encounter (Signed)
Yes, I still need a copy of colonoscopy.  I think we got her to sign release yesterday at appt.  Please confirm.  Thanks.      Dr Nicki Reaper

## 2015-02-02 NOTE — Telephone Encounter (Signed)
Correct, records were requested yesterday (02/01/15)

## 2015-02-07 ENCOUNTER — Encounter: Payer: Self-pay | Admitting: Internal Medicine

## 2015-02-07 DIAGNOSIS — Z0000 Encounter for general adult medical examination without abnormal findings: Secondary | ICD-10-CM | POA: Insufficient documentation

## 2015-02-07 NOTE — Assessment & Plan Note (Signed)
Blood pressure as outlined.  Doing well.  Follow.   

## 2015-02-07 NOTE — Assessment & Plan Note (Signed)
CPAP.  

## 2015-02-07 NOTE — Assessment & Plan Note (Signed)
Physical today 02/01/15.  Mammogram 04/20/14 - Birads I.  Colonoscopy 11/16/09.

## 2015-02-07 NOTE — Assessment & Plan Note (Signed)
Abdominal ultrasound revealed no acute abnormality of the liver.  Follow liver panel.

## 2015-02-07 NOTE — Assessment & Plan Note (Signed)
Low cholesterol diet and exercise.  Follow lipid panel.   Lab Results  Component Value Date   CHOL 155 07/22/2014   HDL 35.30* 07/22/2014   LDLCALC 112* 12/30/2013   LDLDIRECT 88.0 07/22/2014   TRIG 237.0* 07/22/2014   CHOLHDL 4 07/22/2014

## 2015-02-16 ENCOUNTER — Other Ambulatory Visit (INDEPENDENT_AMBULATORY_CARE_PROVIDER_SITE_OTHER): Payer: Medicare Other

## 2015-02-16 ENCOUNTER — Encounter: Payer: Self-pay | Admitting: Internal Medicine

## 2015-02-16 ENCOUNTER — Other Ambulatory Visit: Payer: Self-pay | Admitting: *Deleted

## 2015-02-16 DIAGNOSIS — Z1211 Encounter for screening for malignant neoplasm of colon: Secondary | ICD-10-CM | POA: Diagnosis not present

## 2015-02-16 LAB — FECAL OCCULT BLOOD, IMMUNOCHEMICAL: FECAL OCCULT BLD: NEGATIVE

## 2015-02-18 NOTE — Telephone Encounter (Signed)
Unread mychart message mailed to patient 

## 2015-02-24 DIAGNOSIS — Z23 Encounter for immunization: Secondary | ICD-10-CM | POA: Diagnosis not present

## 2015-02-24 DIAGNOSIS — S61219A Laceration without foreign body of unspecified finger without damage to nail, initial encounter: Secondary | ICD-10-CM | POA: Diagnosis not present

## 2015-03-18 ENCOUNTER — Ambulatory Visit (INDEPENDENT_AMBULATORY_CARE_PROVIDER_SITE_OTHER): Payer: Medicare Other | Admitting: Nurse Practitioner

## 2015-03-18 ENCOUNTER — Encounter: Payer: Self-pay | Admitting: Nurse Practitioner

## 2015-03-18 VITALS — BP 102/60 | HR 105 | Temp 99.6°F | Resp 16 | Ht 65.75 in | Wt 159.6 lb

## 2015-03-18 DIAGNOSIS — J069 Acute upper respiratory infection, unspecified: Secondary | ICD-10-CM | POA: Diagnosis not present

## 2015-03-18 DIAGNOSIS — R21 Rash and other nonspecific skin eruption: Secondary | ICD-10-CM

## 2015-03-18 DIAGNOSIS — B9789 Other viral agents as the cause of diseases classified elsewhere: Principal | ICD-10-CM

## 2015-03-18 MED ORDER — METHYLPREDNISOLONE ACETATE 80 MG/ML IJ SUSP
80.0000 mg | Freq: Once | INTRAMUSCULAR | Status: AC
  Administered 2015-03-18: 80 mg via INTRAMUSCULAR

## 2015-03-18 NOTE — Assessment & Plan Note (Signed)
Poison oak exposure Depo-Medrol 80 mg/mL given today for cough/rash  Pt didn't not want taper  Will follow

## 2015-03-18 NOTE — Progress Notes (Signed)
Patient ID: Dawn Wolfe, female    DOB: December 15, 1945  Age: 70 y.o. MRN: [id number]  CC: Cough and Generalized Body Aches   HPI Dawn Wolfe presents for chief complaint of cough 2 days and generalized body aches.   1) Started Tuesday afternoon with Myalgias and a cough Working in the yard raking up leaves for 4 days prior Denies fever, chills, sweats  Sleeping well Also has Rash from poison oak exposure  Denies sick contacts  Treatment to date:  Alkaseltzer plus   History Dawn Wolfe has a past medical history of Obstructive sleep apnea on CPAP (2007); History of colonoscopy (2003); Normal exercise sestamibi stress test (2008); Rotator cuff tear, left; Hypercholesterolemia; Sleep apnea; Cancer ([place]) (15 yrs ago); and Hemorrhoids.   She has past surgical history that includes ABDOMINALPLASTY W/ LIPOSUCTION (AGE 75); Nasal septum surgery (AGE 60); Nasal sinus surgery (AGE 42); Bunionectomy (X3  LAST ONE 2003); Shoulder arthroscopy (10/25/2011); and Tubal ligation (1982).   Her family history includes Acute myelogenous leukemia in her sister; Breast cancer in her paternal aunt; Emphysema in her father and mother; Heart disease in her father and mother; Leukemia in her sister; Polycythemia (age of onset: 83) in her sister; Testicular cancer in her brother. There is no history of Colon cancer.She reports that she quit smoking about 36 years ago. Her smoking use included Cigarettes. She has a 25 pack-year smoking history. She has never used smokeless tobacco. She reports that she does not drink alcohol or use illicit drugs.  Outpatient Prescriptions Prior to Visit  Medication Sig Dispense Refill  . aspirin 81 MG tablet Take 81 mg by mouth daily.     . fish oil-omega-3 fatty acids 1000 MG capsule Take 1 g by mouth daily.     . fluticasone (FLONASE) 50 MCG/ACT nasal spray Place 2 sprays into both nostrils daily. 16 g 3  . Multiple Vitamin (MULTIVITAMIN) tablet Take 1 tablet by mouth  daily.     . Multiple Vitamins-Minerals (EYE VITAMINS) CAPS Take 1 capsule by mouth 2 (two) times daily at 10 AM and 5 PM.     . pravastatin (PRAVACHOL) 10 MG tablet TAKE ONE TABLET BY MOUTH EVERY DAY 30 tablet 5   No facility-administered medications prior to visit.    ROS Review of Systems  Constitutional: Positive for fatigue. Negative for fever, chills and diaphoresis.  HENT: Negative for congestion, ear pain, rhinorrhea, sinus pressure, sneezing and sore throat.   Eyes: Negative for visual disturbance.  Respiratory: Positive for cough. Negative for chest tightness, shortness of breath and wheezing.   Cardiovascular: Negative for chest pain, palpitations and leg swelling.  Gastrointestinal: Negative for nausea, vomiting and diarrhea.  Skin: Positive for rash.  Neurological: Negative for dizziness and headaches.    Objective:  BP 102/60 mmHg  Pulse 105  Temp(Src) 99.6 F (37.6 C) (Oral)  Resp 16  Ht 5' 5.75" (1.67 m)  Wt 159 lb 9.6 oz (72.394 kg)  BMI 25.96 kg/m2  SpO2 96% Repeat 94 pulse  Physical Exam  Constitutional: She is oriented to person, place, and time. She appears well-developed and well-nourished. No distress.  HENT:  Head: Normocephalic and atraumatic.  Right Ear: External ear normal.  Left Ear: External ear normal.  Cardiovascular: Normal rate, regular rhythm and normal heart sounds.  Exam reveals no gallop and no friction rub.   No murmur heard. Pulmonary/Chest: Effort normal and breath sounds normal. No respiratory distress. She has no wheezes. She has no rales. She exhibits  no tenderness.  Neurological: She is alert and oriented to person, place, and time. No cranial nerve deficit. She exhibits normal muscle tone. Coordination normal.  Skin: Skin is warm and dry. Rash noted. She is not diaphoretic.     Linear raised erythematous areas from scratching- looks poison oak/ivy related  Psychiatric: She has a normal mood and affect. Her behavior is normal.  Judgment and thought content normal.   Assessment & Plan:   Dawn Wolfe was seen today for cough and generalized body aches.  Diagnoses and all orders for this visit:  Viral URI with cough  Rash and nonspecific skin eruption -     methylPREDNISolone acetate (DEPO-MEDROL) injection 80 mg; Inject 1 mL (80 mg total) into the muscle once.   I am having Dawn Wolfe maintain her aspirin, multivitamin, fish oil-omega-3 fatty acids, fluticasone, pravastatin, and EYE VITAMINS. We administered methylPREDNISolone acetate.  Meds ordered this encounter  Medications  . methylPREDNISolone acetate (DEPO-MEDROL) injection 80 mg    Sig:      Follow-up: Return if symptoms worsen or fail to improve.

## 2015-03-18 NOTE — Assessment & Plan Note (Signed)
New onset Will treat conservatively due to probable viral nature Depo Medrol 80 mg/mL given IM by CMA today OTC measures continue  FU prn worsening/failure to improve.

## 2015-03-18 NOTE — Patient Instructions (Signed)
Continue over the counter medicines for cough and body aches. This will resolve over the next few weeks (may be slow). Lots of fluids and rest!   The prednisone shot should get into your system and work over the next 24 hours and work for the next few days.   Call us if it worsens or anything changes in the next 7 days.

## 2015-03-20 ENCOUNTER — Other Ambulatory Visit: Payer: Self-pay | Admitting: Internal Medicine

## 2015-03-22 DIAGNOSIS — G4733 Obstructive sleep apnea (adult) (pediatric): Secondary | ICD-10-CM | POA: Diagnosis not present

## 2015-04-01 ENCOUNTER — Other Ambulatory Visit (INDEPENDENT_AMBULATORY_CARE_PROVIDER_SITE_OTHER): Payer: Medicare Other

## 2015-04-01 DIAGNOSIS — R748 Abnormal levels of other serum enzymes: Secondary | ICD-10-CM

## 2015-04-01 DIAGNOSIS — E7800 Pure hypercholesterolemia, unspecified: Secondary | ICD-10-CM

## 2015-04-01 LAB — HEPATIC FUNCTION PANEL
ALBUMIN: 4.2 g/dL (ref 3.5–5.2)
ALT: 24 U/L (ref 0–35)
AST: 22 U/L (ref 0–37)
Alkaline Phosphatase: 60 U/L (ref 39–117)
Bilirubin, Direct: 0.1 mg/dL (ref 0.0–0.3)
Total Bilirubin: 0.6 mg/dL (ref 0.2–1.2)
Total Protein: 7.4 g/dL (ref 6.0–8.3)

## 2015-04-01 LAB — CBC WITH DIFFERENTIAL/PLATELET
BASOS ABS: 0 10*3/uL (ref 0.0–0.1)
Basophils Relative: 0.6 % (ref 0.0–3.0)
Eosinophils Absolute: 0.2 10*3/uL (ref 0.0–0.7)
Eosinophils Relative: 2.5 % (ref 0.0–5.0)
HEMATOCRIT: 38.9 % (ref 36.0–46.0)
HEMOGLOBIN: 13.3 g/dL (ref 12.0–15.0)
LYMPHS PCT: 37.7 % (ref 12.0–46.0)
Lymphs Abs: 2.8 10*3/uL (ref 0.7–4.0)
MCHC: 34.1 g/dL (ref 30.0–36.0)
MCV: 86.7 fl (ref 78.0–100.0)
MONOS PCT: 5.6 % (ref 3.0–12.0)
Monocytes Absolute: 0.4 10*3/uL (ref 0.1–1.0)
NEUTROS ABS: 4 10*3/uL (ref 1.4–7.7)
Neutrophils Relative %: 53.6 % (ref 43.0–77.0)
PLATELETS: 258 10*3/uL (ref 150.0–400.0)
RBC: 4.49 Mil/uL (ref 3.87–5.11)
RDW: 14 % (ref 11.5–15.5)
WBC: 7.4 10*3/uL (ref 4.0–10.5)

## 2015-04-01 LAB — BASIC METABOLIC PANEL
BUN: 18 mg/dL (ref 6–23)
CHLORIDE: 104 meq/L (ref 96–112)
CO2: 31 meq/L (ref 19–32)
Calcium: 9.5 mg/dL (ref 8.4–10.5)
Creatinine, Ser: 0.84 mg/dL (ref 0.40–1.20)
GFR: 71.56 mL/min (ref >60.00)
Glucose, Bld: 91 mg/dL (ref 70–99)
POTASSIUM: 4.1 meq/L (ref 3.5–5.1)
SODIUM: 139 meq/L (ref 135–145)

## 2015-04-01 LAB — LIPID PANEL
CHOL/HDL RATIO: 3
Cholesterol: 171 mg/dL (ref 0–200)
HDL: 50.3 mg/dL (ref >39.00)
LDL CALC: 93 mg/dL (ref 0–99)
NONHDL: 121.09
Triglycerides: 142 mg/dL (ref 0.0–149.0)
VLDL: 28.4 mg/dL (ref 0.0–40.0)

## 2015-04-02 ENCOUNTER — Encounter: Payer: Self-pay | Admitting: Internal Medicine

## 2015-04-08 ENCOUNTER — Ambulatory Visit (INDEPENDENT_AMBULATORY_CARE_PROVIDER_SITE_OTHER): Payer: Medicare Other | Admitting: General Surgery

## 2015-04-08 ENCOUNTER — Encounter: Payer: Self-pay | Admitting: General Surgery

## 2015-04-08 VITALS — BP 132/74 | HR 94 | Resp 14 | Ht 65.75 in | Wt 154.0 lb

## 2015-04-08 DIAGNOSIS — K8020 Calculus of gallbladder without cholecystitis without obstruction: Secondary | ICD-10-CM | POA: Diagnosis not present

## 2015-04-08 MED ORDER — SCOPOLAMINE 1 MG/3DAYS TD PT72: 1.0000 | MEDICATED_PATCH | TRANSDERMAL | Status: DC

## 2015-04-08 NOTE — Patient Instructions (Addendum)
The patient is aware to call back for any questions or concerns. Laparoscopic Cholecystectomy Laparoscopic cholecystectomy is surgery to remove the gallbladder. The gallbladder is located in the upper right part of the abdomen, behind the liver. It is a storage sac for bile, which is produced in the liver. Bile aids in the digestion and absorption of fats. Cholecystectomy is often done for inflammation of the gallbladder (cholecystitis). This condition is usually caused by a buildup of gallstones (cholelithiasis) in the gallbladder. Gallstones can block the flow of bile, and that can result in inflammation and pain. In severe cases, emergency surgery may be required. If emergency surgery is not required, you will have time to prepare for the procedure. Laparoscopic surgery is an alternative to open surgery. Laparoscopic surgery has a shorter recovery time. Your common bile duct may also need to be examined during the procedure. If stones are found in the common bile duct, they may be removed. LET [facility] CARE PROVIDER KNOW ABOUT:  Any allergies you have.  All medicines you are taking, including vitamins, herbs, eye drops, creams, and over-the-counter medicines.  Previous problems you or members of your family have had with the use of anesthetics.  Any blood disorders you have.  Previous surgeries you have had.  Any medical conditions you have. RISKS AND COMPLICATIONS Generally, this is a safe procedure. However, problems may occur, including:  Infection.  Bleeding.  Allergic reactions to medicines.  Damage to other structures or organs.  A stone remaining in the common bile duct.  A bile leak from the cyst duct that is clipped when your gallbladder is removed.  The need to convert to open surgery, which requires a larger incision in the abdomen. This may be necessary if your surgeon thinks that it is not safe to continue with a laparoscopic procedure. BEFORE THE PROCEDURE  Ask  your health care provider about:  Changing or stopping your regular medicines. This is especially important if you are taking diabetes medicines or blood thinners.  Taking medicines such as aspirin and ibuprofen. These medicines can thin your blood. Do not take these medicines before your procedure if your health care provider instructs you not to.  Follow instructions from your health care provider about eating or drinking restrictions.  Let your health care provider know if you develop a cold or an infection before surgery.  Plan to have someone take you home after the procedure.  Ask your health care provider how your surgical site will be marked or identified.  You may be given antibiotic medicine to help prevent infection. PROCEDURE  To reduce your risk of infection:  Your health care team will wash or sanitize their hands.  Your skin will be washed with soap.  An IV tube may be inserted into one of your veins.  You will be given a medicine to make you fall asleep (general anesthetic).  A breathing tube will be placed in your mouth.  The surgeon will make several small cuts (incisions) in your abdomen.  A thin, lighted tube (laparoscope) that has a tiny camera on the end will be inserted through one of the small incisions. The camera on the laparoscope will send a picture to a TV screen (monitor) in the operating room. This will give the surgeon a good view inside your abdomen.  A gas will be pumped into your abdomen. This will expand your abdomen to give the surgeon more room to perform the surgery.  Other tools that are needed for  the procedure will be inserted through the other incisions. The gallbladder will be removed through one of the incisions.  After your gallbladder has been removed, the incisions will be closed with stitches (sutures), staples, or skin glue.  Your incisions may be covered with a bandage (dressing). The procedure may vary among health care  providers and hospitals. AFTER THE PROCEDURE  Your blood pressure, heart rate, breathing rate, and blood oxygen level will be monitored often until the medicines you were given have worn off.  You will be given medicines as needed to control your pain.   This information is not intended to replace advice given to you by your health care provider. Make sure you discuss any questions you have with your health care provider.   Document Released: 01/02/2005 Document Revised: 09/23/2014 Document Reviewed: 08/14/2012 Elsevier Interactive Patient Education 2016 [employer].  Patient's surgery has been scheduled for 04-19-15 at [facility].

## 2015-04-08 NOTE — Progress Notes (Signed)
Patient ID: Dawn Wolfe, female   DOB: 1945-08-18, 70 y.o.   MRN: [id number]  Chief Complaint  Patient presents with  . Abdominal Pain    HPI Dawn Wolfe is a 70 y.o. female.  Here today for re evaluation of gall stones. She is now having some abdominal pain. She states it was right upper abdomen and knife in the back. The pain started Sunday 04-04-15 after lunch and then it became dull pain and bloating for 3 days. Sunday lunch was broccoli cheese soup and chicken salad sandwich with lettuce.  Denies diarrhea or constipation. She states she had "marbles" BM on Tuesday. She states her stool is "chalky" in color occasionally for the past 6 months and the last episode was yesterday. This is most likely to occur after an episode of abdominal pain. At the time that her stools are "chalky" she  states her urine was is very dark in spite of good hydration. She states she Typically has 3 BM's in a row and the last one is usually diarrhea. She denies any blood or mucus in the stools. She does admit to a lot more belching.    Patient had an ultrasound done on 01/28/14 evaluating her liver function studies and gall stones were found incidentally. At the time of her evaluationhere in February 2016, she had no symptoms attributable to the biliary tract and had not had any episodes of chalky stools or dark urine.  She is here today with her husband of 50 years, Legrand Como, who was present for the interview and exam .    HPI  Past Medical History  Diagnosis Date  . Obstructive sleep apnea on CPAP 2007  . History of colonoscopy 2003    Normal, Dr. Sammuel Cooper, [place]   . Normal exercise sestamibi stress test 2008    Ken Fath  . Rotator cuff tear, left   . Hypercholesterolemia   . Sleep apnea   . Cancer ([place]) 15 yrs ago    [employer] on the face. Surgical removal  . Hemorrhoids     Past Surgical History  Procedure Laterality Date  . Abdominalplasty w/ liposuction  AGE 44  . Nasal septum  surgery  AGE 36  . Nasal sinus surgery  AGE 24  . Bunionectomy  X3  LAST ONE 2003  . Shoulder arthroscopy  10/25/2011    Procedure: ARTHROSCOPY SHOULDER;  Surgeon: Magnus Sinning, MD;  Location: [facility];  Service: Orthopedics;  Laterality: Left;  with SAD, shave of the labrium   . Tubal ligation  1982  . Colonoscopy  2012    Dr Wyline Mood in [place]    Family History  Problem Relation Age of Onset  . Acute myelogenous leukemia Sister   . Polycythemia Sister 63  . Emphysema Father     smoker  . Heart disease Father     myocardial infarction - 76  . Breast cancer Paternal Aunt   . Colon cancer Neg Hx   . Testicular cancer Brother   . Heart disease Mother   . Emphysema Mother   . Leukemia Sister     Social History Social History  Substance Use Topics  . Smoking status: Former Smoker -- 2.50 packs/day for 10 years    Types: Cigarettes    Quit date: 10/26/1978  . Smokeless tobacco: Never Used  . Alcohol Use: No    Allergies  Allergen Reactions  . Codeine Nausea And Vomiting    Current Outpatient Prescriptions  Medication Sig Dispense Refill  . aspirin 81 MG tablet Take 81 mg by mouth daily.     . fish oil-omega-3 fatty acids 1000 MG capsule Take 1 g by mouth daily.     . fluticasone (FLONASE) 50 MCG/ACT nasal spray Place 2 sprays into both nostrils daily. 16 g 3  . Multiple Vitamin (MULTIVITAMIN) tablet Take 1 tablet by mouth daily.     . Multiple Vitamins-Minerals (EYE VITAMINS) CAPS Take 1 capsule by mouth 2 (two) times daily at 10 AM and 5 PM.     . pravastatin (PRAVACHOL) 10 MG tablet TAKE ONE TABLET BY MOUTH EVERY DAY 30 tablet 11  . scopolamine (TRANSDERM-SCOP, 1.5 MG,) 1 MG/3DAYS Place 1 patch (1.5 mg total) onto the skin every 3 (three) days. Apply prior to surgery 1 patch 0   No current facility-administered medications for this visit.    Review of Systems Review of Systems  Constitutional: Negative.   Respiratory: Negative.    Cardiovascular: Negative.   Gastrointestinal: Positive for abdominal pain.    Blood pressure 132/74, pulse 94, resp. rate 14, height 5' 5.75" (1.67 m), weight 154 lb (69.854 kg).  Physical Exam Physical Exam  Constitutional: She is oriented to person, place, and time. She appears well-developed and well-nourished.  HENT:  Mouth/Throat: Oropharynx is clear and moist.  Eyes: Conjunctivae are normal. No scleral icterus.  Neck: Neck supple.  Cardiovascular: Normal rate, regular rhythm and normal heart sounds.   Pulmonary/Chest: Effort normal and breath sounds normal.  Abdominal: Soft. Normal appearance and bowel sounds are normal. There is no hepatomegaly. There is no tenderness.  Lymphadenopathy:    She has no cervical adenopathy.  Neurological: She is alert and oriented to person, place, and time.  Skin: Skin is warm and dry.  Psychiatric: Her behavior is normal.    Data Reviewed 01/28/2014 abdominal ultrasound showed "persisted wall echo shadow complex consistent with stone filled contracted gallbladder. No change since 11/23/2011 study. Common bile duct 6 mm.  11/26/2014 hepatitis C testing negative.  Liver function and basic metabolic studies from March 2017 were normal.  Assessment    Symptomatic cholelithiasis, possible intermittent passage of common bile duct stones.    Plan    The patient is now develop symptoms consistent with chronic cholecystitis and she is a candidate for elective cholecystectomy.    Laparoscopic Cholecystectomy with Intraoperative Cholangiogram. The procedure, including it's potential risks and complications (including but not limited to infection, bleeding, injury to intra-abdominal organs or bile ducts, bile leak, poor cosmetic result, sepsis and death) were discussed with the patient in detail. Non-operative options, including their inherent risks (acute calculous cholecystitis with possible choledocholithiasis or gallstone pancreatitis, with the  risk of ascending cholangitis, sepsis, and death) were discussed as well. The patient expressed and understanding of what we discussed and wishes to proceed with laparoscopic cholecystectomy. The patient further understands that if it is technically not possible, or it is unsafe to proceed laparoscopically, that I will convert to an open cholecystectomy.  She does have nausea and vomiting easily with anesthesia. A prescription for a Transderm-Scop patch was provided to be applied the evening prior to surgery.  Patient's surgery has been scheduled for 04-19-15 at [facility]. It is okay for patient to continue 81 mg aspirin once daily.    PCP:  Einar Pheasant This information has been scribed by Karie Fetch RNBC.    Robert Bellow 04/09/2015, 7:50 AM

## 2015-04-09 NOTE — H&P (Signed)
HPI  Dawn Wolfe is a 70 y.o. female. Here today for re evaluation of gall stones. She is now having some abdominal pain. She states it was right upper abdomen and knife in the back. The pain started Sunday 04-04-15 after lunch and then it became dull pain and bloating for 3 days. Sunday lunch was broccoli cheese soup and chicken salad sandwich with lettuce.  Denies diarrhea or constipation. She states she had "marbles" BM on Tuesday. She states her stool is "chalky" in color occasionally for the past 6 months and the last episode was yesterday. This is most likely to occur after an episode of abdominal pain. At the time that her stools are "chalky" she states her urine was is very dark in spite of good hydration.  She states she Typically has 3 BM's in a row and the last one is usually diarrhea. She denies any blood or mucus in the stools. She does admit to a lot more belching.  Patient had an ultrasound done on 01/28/14 evaluating her liver function studies and gall stones were found incidentally. At the time of her evaluation here in February 2016, she had no symptoms attributable to the biliary tract and had not had any episodes of chalky stools or dark urine.  She is here today with her husband of 28 years, Legrand Como, who was present for the interview and exam  .  HPI  Past Medical History   Diagnosis  Date   .  Obstructive sleep apnea on CPAP  2007   .  History of colonoscopy  2003     Normal, Dr. Sammuel Cooper, [place]   .  Normal exercise sestamibi stress test  2008     Ken Fath   .  Rotator cuff tear, left    .  Hypercholesterolemia    .  Sleep apnea    .  Cancer ([place])  15 yrs ago     [employer] on the face. Surgical removal   .  Hemorrhoids     Past Surgical History   Procedure  Laterality  Date   .  Abdominalplasty w/ liposuction   AGE 48   .  Nasal septum surgery   AGE 69   .  Nasal sinus surgery   AGE 39   .  Bunionectomy   X3 LAST ONE 2003   .  Shoulder arthroscopy   10/25/2011     Procedure: ARTHROSCOPY SHOULDER; Surgeon: Magnus Sinning, MD; Location: [facility]; Service: Orthopedics; Laterality: Left; with SAD, shave of the labrium   .  Tubal ligation   1982   .  Colonoscopy   2012     Dr Wyline Mood in [place]    Family History   Problem  Relation  Age of Onset   .  Acute myelogenous leukemia  Sister    .  Polycythemia  Sister  56   .  Emphysema  Father      smoker   .  Heart disease  Father      myocardial infarction - 48   .  Breast cancer  Paternal Aunt    .  Colon cancer  Neg Hx    .  Testicular cancer  Brother    .  Heart disease  Mother    .  Emphysema  Mother    .  Leukemia  Sister     Social History  Social History   Substance Use Topics   .  Smoking status:  Former Smoker -- 2.50 packs/day for 10 years     Types:  Cigarettes     Quit date:  10/26/1978   .  Smokeless tobacco:  Never Used   .  Alcohol Use:  No    Allergies   Allergen  Reactions   .  Codeine  Nausea And Vomiting    Current Outpatient Prescriptions   Medication  Sig  Dispense  Refill   .  aspirin 81 MG tablet  Take 81 mg by mouth daily.     .  fish oil-omega-3 fatty acids 1000 MG capsule  Take 1 g by mouth daily.     .  fluticasone (FLONASE) 50 MCG/ACT nasal spray  Place 2 sprays into both nostrils daily.  16 g  3   .  Multiple Vitamin (MULTIVITAMIN) tablet  Take 1 tablet by mouth daily.     .  Multiple Vitamins-Minerals (EYE VITAMINS) CAPS  Take 1 capsule by mouth 2 (two) times daily at 10 AM and 5 PM.     .  pravastatin (PRAVACHOL) 10 MG tablet  TAKE ONE TABLET BY MOUTH EVERY DAY  30 tablet  11   .  scopolamine (TRANSDERM-SCOP, 1.5 MG,) 1 MG/3DAYS  Place 1 patch (1.5 mg total) onto the skin every 3 (three) days. Apply prior to surgery  1 patch  0    No current facility-administered medications for this visit.    Review of Systems  Review of Systems  Constitutional: Negative.  Respiratory: Negative.  Cardiovascular: Negative.  Gastrointestinal:  Positive for abdominal pain.   Blood pressure 132/74, pulse 94, resp. rate 14, height 5' 5.75" (1.67 m), weight 154 lb (69.854 kg).  Physical Exam  Physical Exam  Constitutional: She is oriented to person, place, and time. She appears well-developed and well-nourished.  HENT:  Mouth/Throat: Oropharynx is clear and moist.  Eyes: Conjunctivae are normal. No scleral icterus.  Neck: Neck supple.  Cardiovascular: Normal rate, regular rhythm and normal heart sounds.  Pulmonary/Chest: Effort normal and breath sounds normal.  Abdominal: Soft. Normal appearance and bowel sounds are normal. There is no hepatomegaly. There is no tenderness.  Lymphadenopathy:  She has no cervical adenopathy.  Neurological: She is alert and oriented to person, place, and time.  Skin: Skin is warm and dry.  Psychiatric: Her behavior is normal.   Data Reviewed  01/28/2014 abdominal ultrasound showed "persisted wall echo shadow complex consistent with stone filled contracted gallbladder. No change since 11/23/2011 study. Common bile duct 6 mm.  11/26/2014 hepatitis C testing negative.  Liver function and basic metabolic studies from March 2017 were normal.  Assessment   Symptomatic cholelithiasis, possible intermittent passage of common bile duct stones.   Plan   The patient is now develop symptoms consistent with chronic cholecystitis and she is a candidate for elective cholecystectomy.   Laparoscopic Cholecystectomy with Intraoperative Cholangiogram. The procedure, including it's potential risks and complications (including but not limited to infection, bleeding, injury to intra-abdominal organs or bile ducts, bile leak, poor cosmetic result, sepsis and death) were discussed with the patient in detail. Non-operative options, including their inherent risks (acute calculous cholecystitis with possible choledocholithiasis or gallstone pancreatitis, with the risk of ascending cholangitis, sepsis, and death) were  discussed as well. The patient expressed and understanding of what we discussed and wishes to proceed with laparoscopic cholecystectomy. The patient further understands that if it is technically not possible, or it is unsafe to proceed laparoscopically, that I will convert to an open cholecystectomy.  She does have nausea and vomiting easily with anesthesia. A prescription for a Transderm-Scop patch was provided to be applied the evening prior to surgery.  Patient's surgery has been scheduled for 04-19-15 at [facility]. It is okay for patient to continue 81 mg aspirin once daily.  PCP: Einar Pheasant  This information has been scribed by Karie Fetch RNBC.  Robert Bellow  04/09/2015, 7:50 AM

## 2015-04-13 ENCOUNTER — Encounter
Admission: RE | Admit: 2015-04-13 | Discharge: 2015-04-13 | Disposition: A | Discharge status: Home / Self Care | Payer: Medicare Other | Source: Ambulatory Visit | Attending: General Surgery | Admitting: General Surgery

## 2015-04-13 DIAGNOSIS — Z01810 Encounter for preprocedural cardiovascular examination: Secondary | ICD-10-CM | POA: Insufficient documentation

## 2015-04-13 DIAGNOSIS — Z01812 Encounter for preprocedural laboratory examination: Secondary | ICD-10-CM | POA: Diagnosis not present

## 2015-04-13 HISTORY — DX: Unspecified osteoarthritis, unspecified site: M19.90

## 2015-04-13 HISTORY — DX: Other specified postprocedural states: R11.2

## 2015-04-13 HISTORY — DX: Anemia, unspecified: D64.9

## 2015-04-13 HISTORY — DX: Other specified postprocedural states: Z98.890

## 2015-04-13 HISTORY — DX: Other complications of anesthesia, initial encounter: T88.59XA

## 2015-04-13 HISTORY — DX: Adverse effect of unspecified anesthetic, initial encounter: T41.45XA

## 2015-04-13 HISTORY — DX: Nausea with vomiting, unspecified: R11.2

## 2015-04-13 NOTE — Patient Instructions (Signed)
  Your procedure is scheduled on: 04-19-15 (MONDAY) Report to [facility] To find out your arrival time please call [phone] between 1PM - 3PM on 04-16-15 (FRIDAY)  Remember: Instructions that are not followed completely may result in serious medical risk, up to and including death, or upon the discretion of your surgeon and anesthesiologist your surgery may need to be rescheduled.    _X___ 1. Do not eat food or drink liquids after midnight. No gum chewing or hard candies.     _X___ 2. No Alcohol for 24 hours before or after surgery.   ____ 3. Bring all medications with you on the day of surgery if instructed.    _X___ 4. Notify your doctor if there is any change in your medical condition     (cold, fever, infections).     Do not wear jewelry, make-up, hairpins, clips or nail polish.  Do not wear lotions, powders, or perfumes. You may wear deodorant.  Do not shave 48 hours prior to surgery. Men may shave face and neck.  Do not bring valuables to the hospital.    [facility] is not responsible for any belongings or valuables.               Contacts, dentures or bridgework may not be worn into surgery.  Leave your suitcase in the car. After surgery it may be brought to your room.  For patients admitted to the hospital, discharge time is determined by your treatment team.   Patients discharged the day of surgery will not be allowed to drive home.   Please read over the following fact sheets that you were given:     ____ Take these medicines the morning of surgery with A SIP OF WATER:    1. NONE  2.   3.   4.  5.  6.  ____ Fleet Enema (as directed)   _X___ Use CHG Soap as directed  ____ Use inhalers on the day of surgery  ____ Stop metformin 2 days prior to surgery    ____ Take 1/2 of usual insulin dose the night before surgery and none on the morning of surgery.   ____ Stop Coumadin/Plavix/aspirin-OK TO CONTINUE 81 MG ASPIRIN PER DR  BYRNETT  ____ Stop Anti-inflammatories   _X___ Stop supplements until after surgery-STOP FISH OIL NOW   _X___ Bring C-Pap to the hospital.

## 2015-04-15 DIAGNOSIS — H40153 Residual stage of open-angle glaucoma, bilateral: Secondary | ICD-10-CM | POA: Diagnosis not present

## 2015-04-19 ENCOUNTER — Ambulatory Visit
Admission: RE | Admit: 2015-04-19 | Discharge: 2015-04-19 | Disposition: A | Discharge status: Home / Self Care | Payer: Medicare Other | Source: Ambulatory Visit | Attending: General Surgery | Admitting: General Surgery

## 2015-04-19 ENCOUNTER — Encounter: Payer: Self-pay | Admitting: Anesthesiology

## 2015-04-19 ENCOUNTER — Ambulatory Visit: Payer: Medicare Other

## 2015-04-19 ENCOUNTER — Ambulatory Visit: Payer: Medicare Other | Admitting: Anesthesiology

## 2015-04-19 ENCOUNTER — Encounter
Admission: RE | Disposition: A | Discharge status: Home / Self Care | Payer: Self-pay | Source: Ambulatory Visit | Attending: General Surgery

## 2015-04-19 DIAGNOSIS — D649 Anemia, unspecified: Secondary | ICD-10-CM | POA: Diagnosis not present

## 2015-04-19 DIAGNOSIS — Z7982 Long term (current) use of aspirin: Secondary | ICD-10-CM | POA: Insufficient documentation

## 2015-04-19 DIAGNOSIS — K8010 Calculus of gallbladder with chronic cholecystitis without obstruction: Secondary | ICD-10-CM | POA: Insufficient documentation

## 2015-04-19 DIAGNOSIS — Z8249 Family history of ischemic heart disease and other diseases of the circulatory system: Secondary | ICD-10-CM | POA: Insufficient documentation

## 2015-04-19 DIAGNOSIS — Z79899 Other long term (current) drug therapy: Secondary | ICD-10-CM | POA: Diagnosis not present

## 2015-04-19 DIAGNOSIS — Z825 Family history of asthma and other chronic lower respiratory diseases: Secondary | ICD-10-CM | POA: Insufficient documentation

## 2015-04-19 DIAGNOSIS — G4733 Obstructive sleep apnea (adult) (pediatric): Secondary | ICD-10-CM | POA: Insufficient documentation

## 2015-04-19 DIAGNOSIS — Z87891 Personal history of nicotine dependence: Secondary | ICD-10-CM | POA: Insufficient documentation

## 2015-04-19 DIAGNOSIS — K838 Other specified diseases of biliary tract: Secondary | ICD-10-CM | POA: Diagnosis not present

## 2015-04-19 DIAGNOSIS — K8020 Calculus of gallbladder without cholecystitis without obstruction: Secondary | ICD-10-CM | POA: Diagnosis not present

## 2015-04-19 DIAGNOSIS — Z803 Family history of malignant neoplasm of breast: Secondary | ICD-10-CM | POA: Insufficient documentation

## 2015-04-19 DIAGNOSIS — Z85828 Personal history of other malignant neoplasm of skin: Secondary | ICD-10-CM | POA: Diagnosis not present

## 2015-04-19 DIAGNOSIS — E7800 Pure hypercholesterolemia, unspecified: Secondary | ICD-10-CM | POA: Diagnosis not present

## 2015-04-19 DIAGNOSIS — Z8043 Family history of malignant neoplasm of testis: Secondary | ICD-10-CM | POA: Insufficient documentation

## 2015-04-19 DIAGNOSIS — Z806 Family history of leukemia: Secondary | ICD-10-CM | POA: Diagnosis not present

## 2015-04-19 DIAGNOSIS — Z885 Allergy status to narcotic agent status: Secondary | ICD-10-CM | POA: Insufficient documentation

## 2015-04-19 DIAGNOSIS — M1990 Unspecified osteoarthritis, unspecified site: Secondary | ICD-10-CM | POA: Diagnosis not present

## 2015-04-19 DIAGNOSIS — K8044 Calculus of bile duct with chronic cholecystitis without obstruction: Secondary | ICD-10-CM | POA: Diagnosis not present

## 2015-04-19 HISTORY — PX: CHOLECYSTECTOMY: SHX55

## 2015-04-19 SURGERY — LAPAROSCOPIC CHOLECYSTECTOMY WITH INTRAOPERATIVE CHOLANGIOGRAM
Anesthesia: General | Wound class: Clean Contaminated

## 2015-04-19 MED ORDER — ROCURONIUM BROMIDE 100 MG/10ML IV SOLN
INTRAVENOUS | Status: DC | PRN
  Administered 2015-04-19: 10 mg via INTRAVENOUS

## 2015-04-19 MED ORDER — ONDANSETRON HCL 4 MG/2ML IJ SOLN: 4.0000 mg | Freq: Once | INTRAMUSCULAR | Status: DC | PRN

## 2015-04-19 MED ORDER — ACETAMINOPHEN 10 MG/ML IV SOLN
INTRAVENOUS | Status: AC
  Filled 2015-04-19: qty 100

## 2015-04-19 MED ORDER — FENTANYL CITRATE (PF) 100 MCG/2ML IJ SOLN: 25.0000 ug | INTRAMUSCULAR | Status: DC | PRN

## 2015-04-19 MED ORDER — LACTATED RINGERS IV SOLN
INTRAVENOUS | Status: DC
Start: 2015-04-19 — End: 2015-04-19
  Administered 2015-04-19 (×3): via INTRAVENOUS

## 2015-04-19 MED ORDER — SODIUM CHLORIDE 0.9 % IJ SOLN
INTRAMUSCULAR | Status: AC
  Filled 2015-04-19: qty 50

## 2015-04-19 MED ORDER — ONDANSETRON 4 MG PO TBDP: 4.0000 mg | ORAL_TABLET | ORAL | Status: DC | PRN

## 2015-04-19 MED ORDER — FAMOTIDINE 20 MG PO TABS: 20.0000 mg | ORAL_TABLET | Freq: Once | ORAL | Status: DC

## 2015-04-19 MED ORDER — SUCCINYLCHOLINE CHLORIDE 20 MG/ML IJ SOLN
INTRAMUSCULAR | Status: DC | PRN
  Administered 2015-04-19: 100 mg via INTRAVENOUS

## 2015-04-19 MED ORDER — ACETAMINOPHEN 10 MG/ML IV SOLN
INTRAVENOUS | Status: DC | PRN
  Administered 2015-04-19: 1000 mg via INTRAVENOUS

## 2015-04-19 MED ORDER — DEXAMETHASONE SODIUM PHOSPHATE 10 MG/ML IJ SOLN
INTRAMUSCULAR | Status: DC | PRN
  Administered 2015-04-19: 10 mg via INTRAVENOUS

## 2015-04-19 MED ORDER — PHENYLEPHRINE HCL 10 MG/ML IJ SOLN
INTRAMUSCULAR | Status: DC | PRN
  Administered 2015-04-19: 100 ug via INTRAVENOUS
  Administered 2015-04-19: 200 ug via INTRAVENOUS
  Administered 2015-04-19: 100 ug via INTRAVENOUS

## 2015-04-19 MED ORDER — ONDANSETRON HCL 4 MG/2ML IJ SOLN
INTRAMUSCULAR | Status: DC | PRN
  Administered 2015-04-19: 4 mg via INTRAVENOUS

## 2015-04-19 MED ORDER — LIDOCAINE HCL (CARDIAC) 20 MG/ML IV SOLN
INTRAVENOUS | Status: DC | PRN
  Administered 2015-04-19: 100 mg via INTRAVENOUS

## 2015-04-19 MED ORDER — EPHEDRINE SULFATE 50 MG/ML IJ SOLN
INTRAMUSCULAR | Status: DC | PRN
  Administered 2015-04-19 (×2): 10 mg via INTRAVENOUS

## 2015-04-19 MED ORDER — TRAMADOL HCL 50 MG PO TABS: 50.0000 mg | ORAL_TABLET | ORAL | Status: DC | PRN

## 2015-04-19 MED ORDER — FENTANYL CITRATE (PF) 100 MCG/2ML IJ SOLN
INTRAMUSCULAR | Status: DC | PRN
  Administered 2015-04-19: 50 ug via INTRAVENOUS
  Administered 2015-04-19: 100 ug via INTRAVENOUS

## 2015-04-19 MED ORDER — SUGAMMADEX SODIUM 200 MG/2ML IV SOLN
INTRAVENOUS | Status: DC | PRN
  Administered 2015-04-19: 139.8 mg via INTRAVENOUS

## 2015-04-19 MED ORDER — MIDAZOLAM HCL 2 MG/2ML IJ SOLN
INTRAMUSCULAR | Status: DC | PRN
  Administered 2015-04-19: 2 mg via INTRAVENOUS

## 2015-04-19 MED ORDER — SODIUM CHLORIDE 0.9 % IV SOLN
INTRAVENOUS | Status: DC | PRN
  Administered 2015-04-19: 20 mL

## 2015-04-19 MED ORDER — KETOROLAC TROMETHAMINE 30 MG/ML IJ SOLN
INTRAMUSCULAR | Status: DC | PRN
  Administered 2015-04-19: 30 mg via INTRAVENOUS

## 2015-04-19 MED ORDER — PROPOFOL 10 MG/ML IV BOLUS
INTRAVENOUS | Status: DC | PRN
  Administered 2015-04-19: 140 mg via INTRAVENOUS

## 2015-04-19 SURGICAL SUPPLY — 41 items
APPLIER CLIP ROT 10 11.4 M/L (STAPLE) ×2
APR CLP MED LRG 11.4X10 (STAPLE) ×1
BLADE SURG 11 STRL SS SAFETY (MISCELLANEOUS) ×2 IMPLANT
CANISTER SUCT 1200ML W/VALVE (MISCELLANEOUS) ×2 IMPLANT
CANNULA DILATOR 10 W/SLV (CANNULA) ×2 IMPLANT
CANNULA DILATOR 5 W/SLV (CANNULA) ×4 IMPLANT
CATH CHOLANG 76X19 KUMAR (CATHETERS) ×2 IMPLANT
CHLORAPREP W/TINT 26ML (MISCELLANEOUS) ×2 IMPLANT
CLIP APPLIE ROT 10 11.4 M/L (STAPLE) ×1 IMPLANT
CONRAY 60ML FOR OR (MISCELLANEOUS) ×2 IMPLANT
DISSECTOR KITTNER STICK (MISCELLANEOUS) ×1 IMPLANT
DISSECTORS/KITTNER STICK (MISCELLANEOUS)
DRAPE SHEET LG 3/4 BI-LAMINATE (DRAPES) ×2 IMPLANT
DRESSING TELFA 4X3 1S ST N-ADH (GAUZE/BANDAGES/DRESSINGS) ×2 IMPLANT
DRSG TEGADERM 2-3/8X2-3/4 SM (GAUZE/BANDAGES/DRESSINGS) ×8 IMPLANT
ELECT REM PT RETURN 9FT ADLT (ELECTROSURGICAL) ×2
ELECTRODE REM PT RTRN 9FT ADLT (ELECTROSURGICAL) ×1 IMPLANT
ENDOPOUCH RETRIEVER 10 (MISCELLANEOUS) ×2 IMPLANT
GLOVE BIO SURGEON STRL SZ7.5 (GLOVE) ×5 IMPLANT
GLOVE INDICATOR 8.0 STRL GRN (GLOVE) ×4 IMPLANT
GOWN STRL REUS W/ TWL LRG LVL3 (GOWN DISPOSABLE) ×3 IMPLANT
GOWN STRL REUS W/TWL LRG LVL3 (GOWN DISPOSABLE) ×6
GRASPER SUT TROCAR 14GX15 (MISCELLANEOUS) ×1 IMPLANT
IRRIGATION STRYKERFLOW (MISCELLANEOUS) ×1 IMPLANT
IRRIGATOR STRYKERFLOW (MISCELLANEOUS) ×2
IV LACTATED RINGERS 1000ML (IV SOLUTION) ×2 IMPLANT
KIT RM TURNOVER STRD PROC AR (KITS) ×2 IMPLANT
LABEL OR SOLS (LABEL) ×2 IMPLANT
LOOP SUT CHROMIC 2-0 SGL1 (SUTURE) ×1 IMPLANT
NDL INSUFF ACCESS 14 VERSASTEP (NEEDLE) ×2 IMPLANT
NS IRRIG 500ML POUR BTL (IV SOLUTION) ×2 IMPLANT
PACK LAP CHOLECYSTECTOMY (MISCELLANEOUS) ×2 IMPLANT
SCISSORS METZENBAUM CVD 33 (INSTRUMENTS) ×2 IMPLANT
SEAL FOR SCOPE WARMER C3101 (MISCELLANEOUS) ×1 IMPLANT
STRIP CLOSURE SKIN 1/2X4 (GAUZE/BANDAGES/DRESSINGS) ×2 IMPLANT
SUT VIC AB 0 CT2 27 (SUTURE) ×2 IMPLANT
SUT VIC AB 4-0 FS2 27 (SUTURE) ×2 IMPLANT
SWABSTK COMLB BENZOIN TINCTURE (MISCELLANEOUS) ×2 IMPLANT
TROCAR XCEL NON-BLD 11X100MML (ENDOMECHANICALS) ×2 IMPLANT
TUBING INSUFFLATOR HI FLOW (MISCELLANEOUS) ×2 IMPLANT
WATER STERILE IRR 1000ML POUR (IV SOLUTION) ×2 IMPLANT

## 2015-04-19 NOTE — Op Note (Signed)
Preoperative diagnosis: Chronic cholecystit she was felt to be candidate for elective cholecystectomy.is and cholelithiasis.  Postoperative diagnosis: Same.  Operative procedure: Laparoscopic cholecystectomy with intraoperative cholangiomarked   Operative surgeon: Ollen Bowl,  Anesthesia: Gen. Endotracheal.  EBL:5 mL.  Clinical note: This 70 year old woman has had episodic abdominal pain. Transient elevation of liver function studies and the report of intermittent clay-colored stools. Ultrasound showed evidence of a large solitary stone.common bile duct was normal at 6 mm.  Operative note: With the patient under adequate general endotracheal anesthesia the abdomen was prepped with ChloraPrep and draped. The patient was placed in Trendelenburg position and a varies needle placed. After assuring intra-abdominal location with the hanging drop test the abdomen was insufflated with CO2a 10 mmHg pressure. A 10 mm step port was expanded. Inspection showed no evidence of injury from initial port placement. An 11 mm XL port was placed in the epigastrium. The patient was placed in reverse Trendelenburg position and rolled to the left. 25 mm Step ports were placed in the right lateral abdominal wall.  The gallbladder was noted to be extremely scarred and contracted with distortion of the anatomy. The cystic artery was high on the body of the gallbladder and this was dissected free and divided between clips. The neck of the gallbladder was fairly thick but soft. Making use of a Kumar clamp fluoroscopic cholangiograms were completed using 20 mL of one half strength Conray 60. This appeared to show multiple uniform filling defects, less likely air bubbles throughout the common bile duct. There was however free flow to the duodenum and no evidence of ductal dilatation. The cystic duct was doubly clipped and divided. A 2-0 chromic surgical loop tie was placed over the duct for reinforcement. The posterior branch  of the cystic artery was doubly clipped and divided. The gallbladder was removed from the liver bed making saphenocoronary dissection. It was placed into an Endo Catch bag and then delivered to the umbilical port site. The fascia was opened to allow passage. The gallbladder was opened and a large irregular stone was noted. The fascial opening was closed making use of a 0 Vicryl suture with the cone and suture passer. The camera in the epigastric position was used to inspect the abdomen with no evidence of injury from initial port placement. The camera was returned to the umbilical site and inspection of the right upper quadrant showed good hemostasis. The area was irrigated with lactated Ringer's solution. The ports were removed and the abdomen desufflated under direct vision. The Vicryl suture at the umbilical fascial level was tightened with good approximation. Skin incisions were closed with 4-0 Vicryl subcuticular sutures. Benzoin, Steri-Strips, Telfa and Tegaderm dressings were applied.  The patient tolerated the procedure well and was taken the recovery room in stable condition.

## 2015-04-19 NOTE — Discharge Instructions (Signed)
Remove your Trans Derm scop patch any time after tomorrow if you are not having any trouble with nausea.'  Zofran: If needed for nausea.   AMBULATORY SURGERY  DISCHARGE INSTRUCTIONS   1) The drugs that you were given will stay in your system until tomorrow so for the next 24 hours you should not:  A) Drive an automobile B) Make any legal decisions C) Drink any alcoholic beverage   2) You may resume regular meals tomorrow.  Today it is better to start with liquids and gradually work up to solid foods.  You may eat anything you prefer, but it is better to start with liquids, then soup and crackers, and gradually work up to solid foods.   3) Please notify your doctor immediately if you have any unusual bleeding, trouble breathing, redness and pain at the surgery site, drainage, fever, or pain not relieved by medication.    4) Additional Instructions:        Please contact your physician with any problems or Same Day Surgery at [phone], Monday through Friday 6 am to 4 pm, or [place] at [facility] number at [phone].

## 2015-04-19 NOTE — H&P (Signed)
No change in history or exam since March 24 visit. For cholecystectomy.

## 2015-04-19 NOTE — Transfer of Care (Signed)
Immediate Anesthesia Transfer of Care Note  Patient: Dawn Wolfe  Procedure(s) Performed: Procedure(s): LAPAROSCOPIC CHOLECYSTECTOMY WITH INTRAOPERATIVE CHOLANGIOGRAM (N/A)  Patient Location: PACU  Anesthesia Type:General  Level of Consciousness: sedated  Airway & Oxygen Therapy: Patient Spontanous Breathing and Patient connected to face mask oxygen  Post-op Assessment: Report given to RN and Post -op Vital signs reviewed and stable  Post vital signs: Reviewed and stable  Last Vitals:  Filed Vitals:   04/19/15 1246 04/19/15 1430  BP:  102/57  Pulse: 98 68  Temp: 36.7 C 36.4 C  Resp: 16 13    Complications: No apparent anesthesia complications

## 2015-04-19 NOTE — Anesthesia Procedure Notes (Signed)
Procedure Name: Intubation Date/Time: 04/19/2015 1:00 PM Performed by: Nelda Marseille Pre-anesthesia Checklist: Patient identified, Patient being monitored, Timeout performed, Emergency Drugs available and Suction available Patient Re-evaluated:Patient Re-evaluated prior to inductionOxygen Delivery Method: Circle system utilized Preoxygenation: Pre-oxygenation with 100% oxygen Intubation Type: IV induction Ventilation: Mask ventilation without difficulty Laryngoscope Size: Mac and 4 Grade View: Grade I Tube type: Oral Tube size: 7.0 mm Number of attempts: 1 Airway Equipment and Method: Stylet Placement Confirmation: ETT inserted through vocal cords under direct vision,  positive ETCO2 and breath sounds checked- equal and bilateral Secured at: 21 cm Tube secured with: Tape Dental Injury: Teeth and Oropharynx as per pre-operative assessment

## 2015-04-19 NOTE — Anesthesia Preprocedure Evaluation (Signed)
Anesthesia Evaluation  Patient identified by MRN, date of birth, ID band Patient awake    Reviewed: Allergy & Precautions, NPO status , Patient's Chart, lab work & pertinent test results, reviewed documented beta blocker date and time   History of Anesthesia Complications (+) PONV  Airway Mallampati: III  TM Distance: >3 FB     Dental  (+) Chipped   Pulmonary sleep apnea and Continuous Positive Airway Pressure Ventilation , former smoker,           Cardiovascular      Neuro/Psych    GI/Hepatic   Endo/Other    Renal/GU      Musculoskeletal  (+) Arthritis ,   Abdominal   Peds  Hematology  (+) anemia ,   Anesthesia Other Findings Will use CPAP tonite for sure. Stress test OK. Severe nausea.   Reproductive/Obstetrics                             Anesthesia Physical Anesthesia Plan  ASA: III  Anesthesia Plan: General   Post-op Pain Management:    Induction: Intravenous  Airway Management Planned: Oral ETT  Additional Equipment:   Intra-op Plan:   Post-operative Plan:   Informed Consent: I have reviewed the patients History and Physical, chart, labs and discussed the procedure including the risks, benefits and alternatives for the proposed anesthesia with the patient or authorized representative who has indicated his/her understanding and acceptance.     Plan Discussed with: CRNA  Anesthesia Plan Comments:         Anesthesia Quick Evaluation

## 2015-04-20 ENCOUNTER — Encounter: Payer: Self-pay | Admitting: General Surgery

## 2015-04-20 NOTE — Anesthesia Postprocedure Evaluation (Signed)
Anesthesia Post Note  Patient: Dawn Wolfe  Procedure(s) Performed: Procedure(s) (LRB): LAPAROSCOPIC CHOLECYSTECTOMY WITH INTRAOPERATIVE CHOLANGIOGRAM (N/A)  Patient location during evaluation: PACU Anesthesia Type: General Level of consciousness: awake and alert Pain management: pain level controlled Vital Signs Assessment: post-procedure vital signs reviewed and stable Respiratory status: spontaneous breathing, nonlabored ventilation, respiratory function stable and patient connected to nasal cannula oxygen Cardiovascular status: blood pressure returned to baseline and stable Postop Assessment: no signs of nausea or vomiting Anesthetic complications: no    Last Vitals:  Filed Vitals:   04/19/15 1550 04/19/15 1617  BP: 122/68 117/62  Pulse: 85 84  Temp:    Resp: 15 16    Last Pain:  Filed Vitals:   04/20/15 0820  PainSc: 2                  Manuel Dall S

## 2015-04-21 LAB — SURGICAL PATHOLOGY

## 2015-04-22 ENCOUNTER — Telehealth: Payer: Self-pay | Admitting: *Deleted

## 2015-04-22 NOTE — Telephone Encounter (Signed)
Patient called back and after discussing with her husband, she would like to go ahead and get things rolling for the ERCP. This patient would like to have this done locally in [place].   An appointment has been scheduled for patient to meet with Dr. Candace Cruise at [facility], 04-23-15 at 11:30 am. This is for office consultation only. ERCP to be arranged for a later date.   Records have been forwarded to Dr. Myrna Blazer office for his review.

## 2015-04-22 NOTE — Telephone Encounter (Signed)
Patient and husband contacted today and made aware of the need for an ERCP. This patient is unsure about where she would like to go to have this completed. Patient states that she would like to wait until her post op appointment with Dr. Bary Castilla before scheduling procedure.   Arrangements are in place for patient to see Dr. Bary Castilla on 04-29-15.

## 2015-04-22 NOTE — Telephone Encounter (Signed)
-----   Message from Robert Bellow, MD sent at 04/21/2015  3:45 PM EDT ----- The patient needs an ERCP. See if she has a preference ([place]: Drs Candace Cruise or Allen Norris) or [place]. Velora Heckler). This can be scheduled for after her f/u visit.

## 2015-04-23 DIAGNOSIS — K8050 Calculus of bile duct without cholangitis or cholecystitis without obstruction: Secondary | ICD-10-CM | POA: Diagnosis not present

## 2015-04-27 ENCOUNTER — Ambulatory Visit (INDEPENDENT_AMBULATORY_CARE_PROVIDER_SITE_OTHER): Payer: Medicare Other | Admitting: General Surgery

## 2015-04-27 VITALS — BP 132/74 | HR 74 | Resp 12 | Ht 65.0 in | Wt 152.0 lb

## 2015-04-27 DIAGNOSIS — K8020 Calculus of gallbladder without cholecystitis without obstruction: Secondary | ICD-10-CM

## 2015-04-27 DIAGNOSIS — K8050 Calculus of bile duct without cholangitis or cholecystitis without obstruction: Secondary | ICD-10-CM

## 2015-04-27 MED ORDER — SCOPOLAMINE 1 MG/3DAYS TD PT72: 1.0000 | MEDICATED_PATCH | TRANSDERMAL | Status: DC

## 2015-04-27 NOTE — Patient Instructions (Signed)
Patient to return as needed. 

## 2015-04-27 NOTE — Progress Notes (Signed)
Patient ID: Dawn Wolfe, female   DOB: 05-25-1945, 70 y.o.   MRN: [id number]  Chief Complaint  Patient presents with  . Routine Post Op    gallbladder    HPI Dawn Wolfe is a 70 y.o. female here today for her post op gallbladder removal done on 04/19/15. Patient states she is doing well.  HPI  Past Medical History  Diagnosis Date  . History of colonoscopy 2003    Normal, Dr. Sammuel Cooper, [place]   . Normal exercise sestamibi stress test 2008    Ken Fath  . Rotator cuff tear, left   . Hypercholesterolemia   . Cancer ([place]) 15 yrs ago    [employer] on the face. Surgical removal  . Hemorrhoids   . Obstructive sleep apnea on CPAP 2007  . Sleep apnea   . Arthritis     left shoulder  . Anemia     h/o as a child  . Complication of anesthesia   . PONV (postoperative nausea and vomiting)     Past Surgical History  Procedure Laterality Date  . Abdominalplasty w/ liposuction  AGE 42  . Nasal septum surgery  AGE 30  . Nasal sinus surgery  AGE 77  . Bunionectomy  X3  LAST ONE 2003  . Shoulder arthroscopy  10/25/2011    Procedure: ARTHROSCOPY SHOULDER;  Surgeon: Magnus Sinning, MD;  Location: [facility];  Service: Orthopedics;  Laterality: Left;  with SAD, shave of the labrium   . Tubal ligation  1982  . Colonoscopy  2012    Dr Wyline Mood in [place]  . Cholecystectomy N/A 04/19/2015    Procedure: LAPAROSCOPIC CHOLECYSTECTOMY WITH INTRAOPERATIVE CHOLANGIOGRAM;  Surgeon: Robert Bellow, MD;  Location: [facility];  Service: General;  Laterality: N/A;    Family History  Problem Relation Age of Onset  . Acute myelogenous leukemia Sister   . Polycythemia Sister 18  . Emphysema Father     smoker  . Heart disease Father     myocardial infarction - 71  . Breast cancer Paternal Aunt   . Colon cancer Neg Hx   . Testicular cancer Brother   . Heart disease Mother   . Emphysema Mother   . Leukemia Sister     Social History Social History  Substance Use  Topics  . Smoking status: Former Smoker -- 2.50 packs/day for 10 years    Types: Cigarettes    Quit date: 10/26/1978  . Smokeless tobacco: Never Used  . Alcohol Use: No    Allergies  Allergen Reactions  . Codeine Nausea And Vomiting    Current Outpatient Prescriptions  Medication Sig Dispense Refill  . aspirin 81 MG tablet Take 81 mg by mouth daily.     . fish oil-omega-3 fatty acids 1000 MG capsule Take 1 g by mouth daily.     . fluticasone (FLONASE) 50 MCG/ACT nasal spray Place 2 sprays into both nostrils daily. (Patient taking differently: Place 2 sprays into both nostrils as needed. ) 16 g 3  . Multiple Vitamin (MULTIVITAMIN) tablet Take 1 tablet by mouth daily.     . pravastatin (PRAVACHOL) 10 MG tablet TAKE ONE TABLET BY MOUTH EVERY DAY (Patient taking differently: TAKE ONE TABLET BY MOUTH EVERY DAY-pm) 30 tablet 11  . scopolamine (TRANSDERM-SCOP, 1.5 MG,) 1 MG/3DAYS Place 1 patch (1.5 mg total) onto the skin every 3 (three) days. Apply prior to surgery 1 patch 0  . ondansetron (ZOFRAN ODT) 4 MG disintegrating tablet Take  1 tablet (4 mg total) by mouth every 4 (four) hours as needed for nausea or vomiting. (Patient not taking: Reported on 04/27/2015) 20 tablet 0  . traMADol (ULTRAM) 50 MG tablet Take 1 tablet (50 mg total) by mouth every 4 (four) hours as needed for moderate pain or severe pain. (Patient not taking: Reported on 04/27/2015) 30 tablet 0   No current facility-administered medications for this visit.    Review of Systems Review of Systems  Constitutional: Negative.   Respiratory: Negative.   Cardiovascular: Negative.     Blood pressure 132/74, pulse 74, resp. rate 12, height 5\' 5"  (1.651 m), weight 152 lb (68.947 kg).  Physical Exam Physical Exam  Constitutional: She is oriented to person, place, and time. She appears well-developed and well-nourished.  Abdominal:  Port sites are clean and healing well.   Neurological: She is alert and oriented to person,  place, and time.  Skin: Skin is warm and dry.    Data Reviewed Pathology showed chronic cholecystitis and cholelithiasis.  Cholangiogram showed multiple common duct stones.  Assessment    Doing well status post cholecystectomy.    Plan    ERCP and stone extraction is planned for 04/29/2015 with Verdie Shire, M.D. The patient requested and was provided a Transderm scopolamine patch for use the night prior to surgery as this produced significant improvement in her postanesthetic experience during her cholecystectomy.   Patient to return as needed. PCP:  Einar Pheasant This information has been scribed by Gaspar Cola CMA.    Robert Bellow 04/28/2015, 4:47 PM

## 2015-04-28 ENCOUNTER — Encounter: Payer: Self-pay | Admitting: *Deleted

## 2015-04-28 DIAGNOSIS — K8050 Calculus of bile duct without cholangitis or cholecystitis without obstruction: Secondary | ICD-10-CM | POA: Insufficient documentation

## 2015-04-29 ENCOUNTER — Encounter
Admission: RE | Disposition: A | Discharge status: Home / Self Care | Payer: Self-pay | Source: Ambulatory Visit | Attending: Gastroenterology

## 2015-04-29 ENCOUNTER — Ambulatory Visit: Payer: Medicare Other | Admitting: Anesthesiology

## 2015-04-29 ENCOUNTER — Ambulatory Visit
Admission: RE | Admit: 2015-04-29 | Discharge: 2015-04-29 | Disposition: A | Discharge status: Home / Self Care | Payer: Medicare Other | Source: Ambulatory Visit | Attending: Gastroenterology | Admitting: Gastroenterology

## 2015-04-29 ENCOUNTER — Encounter: Payer: Self-pay | Admitting: *Deleted

## 2015-04-29 ENCOUNTER — Ambulatory Visit: Payer: Medicare Other | Admitting: General Surgery

## 2015-04-29 ENCOUNTER — Ambulatory Visit: Payer: Medicare Other

## 2015-04-29 DIAGNOSIS — Z85828 Personal history of other malignant neoplasm of skin: Secondary | ICD-10-CM | POA: Diagnosis not present

## 2015-04-29 DIAGNOSIS — Z87891 Personal history of nicotine dependence: Secondary | ICD-10-CM | POA: Diagnosis not present

## 2015-04-29 DIAGNOSIS — M1990 Unspecified osteoarthritis, unspecified site: Secondary | ICD-10-CM | POA: Diagnosis not present

## 2015-04-29 DIAGNOSIS — R932 Abnormal findings on diagnostic imaging of liver and biliary tract: Secondary | ICD-10-CM | POA: Diagnosis not present

## 2015-04-29 DIAGNOSIS — E7800 Pure hypercholesterolemia, unspecified: Secondary | ICD-10-CM | POA: Diagnosis not present

## 2015-04-29 DIAGNOSIS — G4733 Obstructive sleep apnea (adult) (pediatric): Secondary | ICD-10-CM | POA: Insufficient documentation

## 2015-04-29 DIAGNOSIS — K8050 Calculus of bile duct without cholangitis or cholecystitis without obstruction: Secondary | ICD-10-CM | POA: Diagnosis not present

## 2015-04-29 DIAGNOSIS — D649 Anemia, unspecified: Secondary | ICD-10-CM | POA: Diagnosis not present

## 2015-04-29 HISTORY — PX: ERCP: SHX5425

## 2015-04-29 SURGERY — ERCP, WITH INTERVENTION IF INDICATED
Anesthesia: General

## 2015-04-29 MED ORDER — CEFAZOLIN SODIUM 1-5 GM-% IV SOLN
1.0000 g | Freq: Once | INTRAVENOUS | Status: AC
  Administered 2015-04-29: 1 g via INTRAVENOUS
  Filled 2015-04-29: qty 50

## 2015-04-29 MED ORDER — IPRATROPIUM-ALBUTEROL 0.5-2.5 (3) MG/3ML IN SOLN
3.0000 mL | Freq: Once | RESPIRATORY_TRACT | Status: AC
Start: 2015-04-29 — End: 2015-04-29
  Administered 2015-04-29: 3 mL via RESPIRATORY_TRACT
  Filled 2015-04-29: qty 3

## 2015-04-29 MED ORDER — SODIUM CHLORIDE 0.9 % IV SOLN
INTRAVENOUS | Status: DC
  Administered 2015-04-29: 14:00:00 via INTRAVENOUS

## 2015-04-29 MED ORDER — GLYCOPYRROLATE 0.2 MG/ML IJ SOLN
INTRAMUSCULAR | Status: DC | PRN
  Administered 2015-04-29: 0.2 mg via INTRAVENOUS

## 2015-04-29 MED ORDER — INDOMETHACIN 50 MG RE SUPP
100.0000 mg | Freq: Once | RECTAL | Status: AC
  Administered 2015-04-29: 100 mg via RECTAL
  Filled 2015-04-29: qty 2

## 2015-04-29 MED ORDER — LIDOCAINE HCL (CARDIAC) 20 MG/ML IV SOLN
INTRAVENOUS | Status: DC | PRN
  Administered 2015-04-29: 100 mg via INTRAVENOUS

## 2015-04-29 MED ORDER — ONDANSETRON HCL 4 MG/2ML IJ SOLN
INTRAMUSCULAR | Status: DC | PRN
  Administered 2015-04-29: 4 mg via INTRAVENOUS

## 2015-04-29 MED ORDER — PROPOFOL 10 MG/ML IV BOLUS
INTRAVENOUS | Status: DC | PRN
  Administered 2015-04-29 (×2): 40 mg via INTRAVENOUS
  Administered 2015-04-29: 20 mg via INTRAVENOUS

## 2015-04-29 MED ORDER — FENTANYL CITRATE (PF) 100 MCG/2ML IJ SOLN
INTRAMUSCULAR | Status: DC | PRN
  Administered 2015-04-29: 50 ug via INTRAVENOUS

## 2015-04-29 MED ORDER — MIDAZOLAM HCL 2 MG/2ML IJ SOLN
INTRAMUSCULAR | Status: DC | PRN
  Administered 2015-04-29: 1 mg via INTRAVENOUS

## 2015-04-29 MED ORDER — SODIUM CHLORIDE 0.9 % IV SOLN: INTRAVENOUS | Status: DC

## 2015-04-29 MED ORDER — PROPOFOL 500 MG/50ML IV EMUL
INTRAVENOUS | Status: DC | PRN
  Administered 2015-04-29: 100 ug/kg/min via INTRAVENOUS

## 2015-04-29 NOTE — Transfer of Care (Signed)
Immediate Anesthesia Transfer of Care Note  Patient: Dawn Wolfe  Procedure(s) Performed: Procedure(s): ENDOSCOPIC RETROGRADE CHOLANGIOPANCREATOGRAPHY (ERCP) (N/A)  Patient Location: PACU  Anesthesia Type:General  Level of Consciousness: awake, alert  and oriented  Airway & Oxygen Therapy: Patient Spontanous Breathing and Patient connected to nasal cannula oxygen  Post-op Assessment: Report given to RN and Post -op Vital signs reviewed and stable  Post vital signs: Reviewed and stable  Last Vitals:  Filed Vitals:   04/29/15 1403  BP: 141/74  Pulse: 100  Temp: 37.1 C  Resp: 14    Complications: No apparent anesthesia complications

## 2015-04-29 NOTE — Anesthesia Preprocedure Evaluation (Signed)
Anesthesia Evaluation  Patient identified by MRN, date of birth, ID band Patient awake    Reviewed: Allergy & Precautions, H&P , NPO status , Patient's Chart, lab work & pertinent test results  History of Anesthesia Complications (+) PONV and history of anesthetic complications  Airway Mallampati: III  TM Distance: >3 FB Neck ROM: limited    Dental  (+) Poor Dentition   Pulmonary neg shortness of breath, sleep apnea , former smoker,    Pulmonary exam normal breath sounds clear to auscultation       Cardiovascular Exercise Tolerance: Good (-) hypertension(-) angina(-) Past MI negative cardio ROS Normal cardiovascular exam Rhythm:regular Rate:Normal     Neuro/Psych negative neurological ROS  negative psych ROS   GI/Hepatic negative GI ROS, Neg liver ROS, neg GERD  ,  Endo/Other  negative endocrine ROS  Renal/GU negative Renal ROS  negative genitourinary   Musculoskeletal negative musculoskeletal ROS (+) Arthritis ,   Abdominal   Peds negative pediatric ROS (+)  Hematology negative hematology ROS (+)   Anesthesia Other Findings Past Medical History:   History of colonoscopy                          2003           Comment:Normal, Dr. Sammuel Cooper, [place]    Normal exercise sestamibi stress test           2008           Comment:Ken Fath   Rotator cuff tear, left                                      Hypercholesterolemia                                         Cancer ([place])                                    15 yrs ago     Comment:Bas[place] [employer] on the face. Surgical removal   Hemorrhoids                                                  Obstructive sleep apnea on CPAP                 2007         Sleep apnea                                                  Arthritis                                                      Comment:left shoulder   Anemia  Comment:h/o as a  child   Complication of anesthesia                                   PONV (postoperative nausea and vomiting)                    Past Surgical History:   ABDOMINALPLASTY W/ LIPOSUCTION                   AGE 70       NASAL SEPTUM SURGERY                             AGE 70       NASAL SINUS SURGERY                              AGE 30       BUNIONECTOMY                                     X3  LAST *   SHOULDER ARTHROSCOPY                             10/25/2011      Comment:Procedure: ARTHROSCOPY SHOULDER;  Surgeon:               Magnus Sinning, MD;  Location: [facility];  Service: Orthopedics;                Laterality: Left;  with SAD, shave of the               [place]         COLONOSCOPY                                      2012           Comment:Dr Wyline Mood in [place] 04/19/2015       Comment:Procedure: LAPAROSCOPIC CHOLECYSTECTOMY WITH               INTRAOPERATIVE CHOLANGIOGRAM;  Surgeon: Robert Bellow, MD;  Location: [facility];  Service:               General;  Laterality: N/A;  BMI    Body Mass Index   25.12 kg/m 2    Patient is NPO appropriate and reports no nausea or vomiting today.   Reproductive/Obstetrics negative OB ROS  Anesthesia Physical Anesthesia Plan  ASA: III  Anesthesia Plan: General   Post-op Pain Management:    Induction:   Airway Management Planned:   Additional Equipment:   Intra-op Plan:   Post-operative Plan:   Informed Consent: I have reviewed the patients History and Physical, chart, labs and discussed the procedure including the risks, benefits and alternatives for the proposed anesthesia with the patient or authorized representative who has indicated his/her understanding and acceptance.   Dental Advisory Given  Plan  Discussed with: Anesthesiologist, CRNA and Surgeon  Anesthesia Plan Comments:         Anesthesia Quick Evaluation

## 2015-04-29 NOTE — Anesthesia Postprocedure Evaluation (Signed)
Anesthesia Post Note  Patient: Dawn Wolfe  Procedure(s) Performed: Procedure(s) (LRB): ENDOSCOPIC RETROGRADE CHOLANGIOPANCREATOGRAPHY (ERCP) (N/A)  Patient location during evaluation: Endoscopy Anesthesia Type: General Level of consciousness: awake and alert and oriented Pain management: pain level controlled Vital Signs Assessment: post-procedure vital signs reviewed and stable Respiratory status: spontaneous breathing Cardiovascular status: blood pressure returned to baseline Anesthetic complications: no    Last Vitals:  Filed Vitals:   04/29/15 1618 04/29/15 1628  BP: 133/75 131/75  Pulse: 96 92  Temp:    Resp: 20 19    Last Pain: There were no vitals filed for this visit.               Noelly Lasseigne

## 2015-04-29 NOTE — Op Note (Signed)
[facility] Patient Name: Dawn Wolfe Procedure Date: 04/29/2015 3:26 PM MRN: [id number] Account #: [id number] Date of Birth: 04-30-1945 Admit Type: Outpatient Age: 70 Room: [facility] ENDO ROOM 4 Gender: Female Note Status: Finalized Procedure:            ERCP Indications:          Filling defect on intraoperative cholangiogram Providers:            Lupita Dawn. Candace Cruise, MD Referring MD:         Einar Pheasant, MD (Referring MD) Medicines:            Monitored Anesthesia Care Complications:        No immediate complications. Procedure:            Pre-Anesthesia Assessment:                       - Prior to the procedure, a History and Physical was                        performed, and patient medications, allergies and                        sensitivities were reviewed. The patient's tolerance of                        previous anesthesia was reviewed.                       - The risks and benefits of the procedure and the                        sedation options and risks were discussed with the                        patient. All questions were answered and informed                        consent was obtained.                       - After reviewing the risks and benefits, the patient                        was deemed in satisfactory condition to undergo the                        procedure.                       After obtaining informed consent, the scope was passed                        under direct vision. Throughout the procedure, the                        patient's blood pressure, pulse, and oxygen saturations                        were monitored continuously. The Endosonoscope was  introduced through the mouth, and used to inject                        contrast into and used to cannulate the bile duct. The                        ERCP was accomplished without difficulty. The patient                        tolerated the  procedure well. Findings:      The scout film was normal. The esophagus was successfully intubated       under direct vision. The scope was advanced to a normal major papilla in       the descending duodenum without detailed examination of the pharynx,       larynx and associated structures, and upper GI tract. The upper GI tract       was grossly normal. The bile duct was deeply cannulated with the       short-nosed traction sphincterotome. Contrast was injected. I personally       interpreted the bile duct images. Ductal flow of contrast was adequate.       Image quality was adequate. Contrast extended to the entire biliary       tree. The intra-hepatic and extra-hepatic biliary duct system was       normal. Air bubbles were visualized in the lower third of the main bile       duct and middle third of the main bile duct. A straight Roadrunner wire       was passed into the biliary tree. Biliary sphincterotomy was made with a       monofilament traction (standard) sphincterotome using ERBE       electrocautery. The sphincterotomy oozed blood. The biliary tree was       swept with a 13.5 mm balloon starting at the bifurcation. Nothing was       found. Impression:           - Bubbles were found in the biliary tract.                       - The cholangiogram was normal.                       - A biliary sphincterotomy was performed.                       - The biliary tree was swept and nothing was found. Recommendation:       - Discharge patient to home.                       - Observe patient's clinical course.                       - Clear liquid diet today.                       - The findings and recommendations were discussed with                        the patient's family. Procedure Code(s):    --- Professional ---  43262, Endoscopic retrograde cholangiopancreatography                        (ERCP); with sphincterotomy/papillotomy Diagnosis Code(s):    ---  Professional ---                       R93.2, Abnormal findings on diagnostic imaging of liver                        and biliary tract CPT copyright 2016 American Medical Association. All rights reserved. The codes documented in this report are preliminary and upon coder review may  be revised to meet current compliance requirements. Hulen Luster, MD 04/29/2015 3:48:38 PM This report has been signed electronically. Number of Addenda: 0 Note Initiated On: 04/29/2015 3:26 PM      [facility]

## 2015-04-29 NOTE — H&P (Signed)
  Date of Initial H&P: 04/23/2015 History reviewed, patient examined, no change in status, stable for surgery.

## 2015-05-03 ENCOUNTER — Encounter: Payer: Self-pay | Admitting: Gastroenterology

## 2015-05-04 DIAGNOSIS — G4733 Obstructive sleep apnea (adult) (pediatric): Secondary | ICD-10-CM | POA: Diagnosis not present

## 2015-05-14 DIAGNOSIS — H2513 Age-related nuclear cataract, bilateral: Secondary | ICD-10-CM | POA: Diagnosis not present

## 2015-06-03 DIAGNOSIS — H2511 Age-related nuclear cataract, right eye: Secondary | ICD-10-CM | POA: Diagnosis not present

## 2015-06-28 DIAGNOSIS — H2511 Age-related nuclear cataract, right eye: Secondary | ICD-10-CM | POA: Diagnosis not present

## 2015-06-28 DIAGNOSIS — Z961 Presence of intraocular lens: Secondary | ICD-10-CM | POA: Diagnosis not present

## 2015-06-29 DIAGNOSIS — H2512 Age-related nuclear cataract, left eye: Secondary | ICD-10-CM | POA: Diagnosis not present

## 2015-07-01 DIAGNOSIS — G4733 Obstructive sleep apnea (adult) (pediatric): Secondary | ICD-10-CM | POA: Diagnosis not present

## 2015-07-12 DIAGNOSIS — H2512 Age-related nuclear cataract, left eye: Secondary | ICD-10-CM | POA: Diagnosis not present

## 2015-07-14 ENCOUNTER — Other Ambulatory Visit: Payer: Self-pay | Admitting: Internal Medicine

## 2015-07-14 DIAGNOSIS — Z1231 Encounter for screening mammogram for malignant neoplasm of breast: Secondary | ICD-10-CM

## 2015-07-21 ENCOUNTER — Other Ambulatory Visit: Payer: Self-pay | Admitting: Internal Medicine

## 2015-07-21 ENCOUNTER — Telehealth: Payer: Self-pay | Admitting: *Deleted

## 2015-07-21 ENCOUNTER — Ambulatory Visit
Admission: RE | Admit: 2015-07-21 | Discharge: 2015-07-21 | Disposition: A | Discharge status: Home / Self Care | Payer: Medicare Other | Source: Ambulatory Visit | Attending: Internal Medicine | Admitting: Internal Medicine

## 2015-07-21 DIAGNOSIS — Z1231 Encounter for screening mammogram for malignant neoplasm of breast: Secondary | ICD-10-CM

## 2015-07-21 NOTE — Telephone Encounter (Signed)
Call transferred to referrals

## 2015-08-09 DIAGNOSIS — Z961 Presence of intraocular lens: Secondary | ICD-10-CM | POA: Diagnosis not present

## 2015-08-11 ENCOUNTER — Ambulatory Visit (INDEPENDENT_AMBULATORY_CARE_PROVIDER_SITE_OTHER): Payer: Medicare Other | Admitting: Internal Medicine

## 2015-08-11 ENCOUNTER — Encounter: Payer: Self-pay | Admitting: Internal Medicine

## 2015-08-11 VITALS — BP 110/80 | HR 82 | Temp 98.7°F | Wt 159.0 lb

## 2015-08-11 DIAGNOSIS — R002 Palpitations: Secondary | ICD-10-CM | POA: Diagnosis not present

## 2015-08-11 DIAGNOSIS — G4733 Obstructive sleep apnea (adult) (pediatric): Secondary | ICD-10-CM

## 2015-08-11 DIAGNOSIS — E7800 Pure hypercholesterolemia, unspecified: Secondary | ICD-10-CM

## 2015-08-11 DIAGNOSIS — K8050 Calculus of bile duct without cholangitis or cholecystitis without obstruction: Secondary | ICD-10-CM

## 2015-08-11 DIAGNOSIS — J069 Acute upper respiratory infection, unspecified: Secondary | ICD-10-CM

## 2015-08-11 DIAGNOSIS — B9789 Other viral agents as the cause of diseases classified elsewhere: Secondary | ICD-10-CM

## 2015-08-11 DIAGNOSIS — R748 Abnormal levels of other serum enzymes: Secondary | ICD-10-CM | POA: Diagnosis not present

## 2015-08-11 LAB — BASIC METABOLIC PANEL
BUN: 17 mg/dL (ref 6–23)
CALCIUM: 9.7 mg/dL (ref 8.4–10.5)
CO2: 30 mEq/L (ref 19–32)
CREATININE: 0.82 mg/dL (ref 0.40–1.20)
Chloride: 104 mEq/L (ref 96–112)
GFR: 73.5 mL/min (ref >60.00)
GLUCOSE: 113 mg/dL — AB (ref 70–99)
POTASSIUM: 3.9 meq/L (ref 3.5–5.1)
Sodium: 140 mEq/L (ref 135–145)

## 2015-08-11 LAB — CBC WITH DIFFERENTIAL/PLATELET
Basophils Absolute: 0 10*3/uL (ref 0.0–0.1)
Basophils Relative: 0.3 % (ref 0.0–3.0)
EOS ABS: 0.2 10*3/uL (ref 0.0–0.7)
EOS PCT: 2.8 % (ref 0.0–5.0)
HCT: 37.2 % (ref 36.0–46.0)
Hemoglobin: 12.7 g/dL (ref 12.0–15.0)
LYMPHS ABS: 1.5 10*3/uL (ref 0.7–4.0)
Lymphocytes Relative: 20.5 % (ref 12.0–46.0)
MCHC: 34.2 g/dL (ref 30.0–36.0)
MCV: 86.4 fl (ref 78.0–100.0)
MONO ABS: 0.5 10*3/uL (ref 0.1–1.0)
Monocytes Relative: 6.9 % (ref 3.0–12.0)
NEUTROS PCT: 69.5 % (ref 43.0–77.0)
Neutro Abs: 4.9 10*3/uL (ref 1.4–7.7)
Platelets: 161 10*3/uL (ref 150.0–400.0)
RBC: 4.3 Mil/uL (ref 3.87–5.11)
RDW: 13.3 % (ref 11.5–15.5)
WBC: 7.1 10*3/uL (ref 4.0–10.5)

## 2015-08-11 LAB — HEPATIC FUNCTION PANEL
ALK PHOS: 62 U/L (ref 39–117)
ALT: 20 U/L (ref 0–35)
AST: 20 U/L (ref 0–37)
Albumin: 4.3 g/dL (ref 3.5–5.2)
BILIRUBIN DIRECT: 0.1 mg/dL (ref 0.0–0.3)
BILIRUBIN TOTAL: 0.6 mg/dL (ref 0.2–1.2)
TOTAL PROTEIN: 7.6 g/dL (ref 6.0–8.3)

## 2015-08-11 LAB — LIPID PANEL
CHOL/HDL RATIO: 4
Cholesterol: 171 mg/dL (ref 0–200)
HDL: 41.5 mg/dL (ref >39.00)
LDL CALC: 96 mg/dL (ref 0–99)
NONHDL: 129.46
Triglycerides: 168 mg/dL — ABNORMAL HIGH (ref 0.0–149.0)
VLDL: 33.6 mg/dL (ref 0.0–40.0)

## 2015-08-11 LAB — TSH: TSH: 1.5 u[IU]/mL (ref 0.35–4.50)

## 2015-08-11 NOTE — Progress Notes (Signed)
Pre visit review using our clinic review tool, if applicable. No additional management support is needed unless otherwise documented below in the visit note. 

## 2015-08-11 NOTE — Progress Notes (Signed)
Patient ID: Dawn Wolfe, female   DOB: February 01, 1945, 70 y.o.   MRN: [id number]   Subjective:    Patient ID: Dawn Wolfe, female    DOB: [birth year], 70 y.o.   MRN: [id number]  HPI  Patient here for a scheduled follow up.  Recently had gallbladder removed 04/27/15.  Doing well.  Eating and drinking well.  No chest pain.  No sob.  No abdominal pain or cramping.  Bowels doing ok.  She does report started having sore throat five days ago.  No fever.  Some drainage.  No sinus pressure.  No chest congestion or sob.     Past Medical History:  Diagnosis Date  . Anemia    h/o as a child  . Arthritis    left shoulder  . 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 Surgical removal  . Complication of anesthesia   . Hemorrhoids   . History of colonoscopy 2003   Normal, Dr. Sammuel Cooper, [place]   . Hypercholesterolemia   . Normal exercise sestamibi stress test 2008   Ken Fath  . Obstructive sleep apnea on CPAP 2007  . PONV (postoperative nausea and vomiting)   . Rotator cuff tear, left   . Sleep apnea    Past Surgical History:  Procedure Laterality Date  . ABDOMINALPLASTY W/ LIPOSUCTION  AGE 81  . BUNIONECTOMY  X3  LAST ONE 2003  . CHOLECYSTECTOMY N/A 04/19/2015   Procedure: LAPAROSCOPIC CHOLECYSTECTOMY WITH INTRAOPERATIVE CHOLANGIOGRAM;  Surgeon: Robert Bellow, MD;  Location: [facility] ORS;  Service: General;  Laterality: N/A;  . COLONOSCOPY  2012   Dr Wyline Mood in [place]  . ERCP N/A 04/29/2015   Procedure: ENDOSCOPIC RETROGRADE CHOLANGIOPANCREATOGRAPHY (ERCP);  Surgeon: Hulen Luster, MD;  Location: [facility] ENDOSCOPY;  Service: Gastroenterology;  Laterality: N/A;  . NASAL SEPTUM SURGERY  AGE 62  . NASAL SINUS SURGERY  AGE 26  . SHOULDER ARTHROSCOPY  10/25/2011   Procedure: ARTHROSCOPY SHOULDER;  Surgeon: Magnus Sinning, MD;  Location: [facility];  Service: Orthopedics;  Laterality: Left;  with SAD, shave of the labrium   . TUBAL LIGATION  1982   Family History    Problem Relation Age of Onset  . Acute myelogenous leukemia Sister   . Polycythemia Sister 41  . Emphysema Father     smoker  . Heart disease Father     myocardial infarction - 28  . Breast cancer Paternal Aunt   . Testicular cancer Brother   . Heart disease Mother   . Emphysema Mother   . Leukemia Sister   . Colon cancer Neg Hx    Social History   Social History  . Marital status: Married    Spouse name: N/A  . Number of children: N/A  . Years of education: N/A   Social History Main Topics  . Smoking status: Former Smoker    Packs/day: 2.50    Years: 10.00    Types: Cigarettes    Quit date: 10/26/1978  . Smokeless tobacco: Never Used  . Alcohol use No  . Drug use: No  . Sexual activity: No   Other Topics Concern  . None   Social History Narrative  . None    Outpatient Encounter Prescriptions as of 08/11/2015  Medication Sig  . aspirin 81 MG tablet Take 81 mg by mouth daily.   . fish oil-omega-3 fatty acids 1000 MG capsule Take 1 g by mouth daily.   . fluticasone (FLONASE)  50 MCG/ACT nasal spray Place 2 sprays into both nostrils daily. (Patient taking differently: Place 2 sprays into both nostrils as needed. )  . Multiple Vitamin (MULTIVITAMIN) tablet Take 1 tablet by mouth daily.   . pravastatin (PRAVACHOL) 10 MG tablet TAKE ONE TABLET BY MOUTH EVERY DAY (Patient taking differently: TAKE ONE TABLET BY MOUTH EVERY DAY-pm)  . [DISCONTINUED] ondansetron (ZOFRAN ODT) 4 MG disintegrating tablet Take 1 tablet (4 mg total) by mouth every 4 (four) hours as needed for nausea or vomiting. (Patient not taking: Reported on 04/27/2015)  . [DISCONTINUED] scopolamine (TRANSDERM-SCOP, 1.5 MG,) 1 MG/3DAYS Place 1 patch (1.5 mg total) onto the skin every 3 (three) days. Apply prior to surgery  . [DISCONTINUED] traMADol (ULTRAM) 50 MG tablet Take 1 tablet (50 mg total) by mouth every 4 (four) hours as needed for moderate pain or severe pain. (Patient not taking: Reported on 04/27/2015)    No facility-administered encounter medications on file as of 08/11/2015.     Review of Systems  Constitutional: Negative for appetite change and unexpected weight change.  HENT: Positive for congestion, postnasal drip and sore throat. Negative for sinus pressure.   Respiratory: Negative for chest tightness, shortness of breath and wheezing.   Cardiovascular: Negative for chest pain, palpitations and leg swelling.       Occasionally will notice some increased heart rate.  Brief.  Does not occur frequently.    Gastrointestinal: Negative for abdominal pain, diarrhea, nausea and vomiting.  Genitourinary: Negative for difficulty urinating and dysuria.  Musculoskeletal: Negative for back pain and joint swelling.  Skin: Negative for color change and rash.  Neurological: Negative for dizziness, light-headedness and headaches.  Psychiatric/Behavioral: Negative for agitation and dysphoric mood.       Objective:    Physical Exam  Constitutional: She appears well-developed and well-nourished. No distress.  HENT:  Nose: Nose normal.  Mouth/Throat: Oropharynx is clear and moist.  No tenderness to palpation over the sinuses.  TMs visualized - no erythema.    Neck: Neck supple. No thyromegaly present.  Cardiovascular: Normal rate and regular rhythm.   Pulmonary/Chest: Breath sounds normal. No respiratory distress. She has no wheezes.  Abdominal: Soft. Bowel sounds are normal. There is no tenderness.  Musculoskeletal: She exhibits no edema or tenderness.  Lymphadenopathy:    She has no cervical adenopathy.  Skin: No rash noted. No erythema.  Psychiatric: She has a normal mood and affect. Her behavior is normal.    BP 110/80   Pulse 82   Temp 98.7 F (37.1 C) (Oral)   Wt 159 lb (72.1 kg)   SpO2 96%   BMI 26.46 kg/m  Wt Readings from Last 3 Encounters:  08/11/15 159 lb (72.1 kg)  04/29/15 151 lb (68.5 kg)  04/27/15 152 lb (68.9 kg)     Lab Results  Component Value Date   WBC 7.1  08/11/2015   HGB 12.7 08/11/2015   HCT 37.2 08/11/2015   PLT 161.0 08/11/2015   GLUCOSE 113 (H) 08/11/2015   CHOL 171 08/11/2015   TRIG 168.0 (H) 08/11/2015   HDL 41.50 08/11/2015   LDLDIRECT 88.0 07/22/2014   LDLCALC 96 08/11/2015   ALT 20 08/11/2015   AST 20 08/11/2015   NA 140 08/11/2015   K 3.9 08/11/2015   CL 104 08/11/2015   CREATININE 0.82 08/11/2015   BUN 17 08/11/2015   CO2 30 08/11/2015   TSH 1.50 08/11/2015    Mm Screening Breast Tomo Bilateral  Result Date: 07/21/2015 CLINICAL DATA:  Screening. EXAM: 2D DIGITAL SCREENING BILATERAL MAMMOGRAM WITH CAD AND ADJUNCT TOMO COMPARISON:  Previous exam(s). ACR Breast Density Category c: The breast tissue is heterogeneously dense, which may obscure small masses. FINDINGS: There are no findings suspicious for malignancy. Images were processed with CAD. IMPRESSION: No mammographic evidence of malignancy. A result letter of this screening mammogram will be mailed directly to the patient. RECOMMENDATION: Screening mammogram in one year. (Code:SM-B-01Y) BI-RADS CATEGORY  1: Negative. Electronically Signed   By: Pamelia Hoit M.D.   On: 07/21/2015 18:47       Assessment & Plan:   Problem List Items Addressed This Visit    Abnormal liver enzymes    Abdominal ultrasound revealed no acute abnormality.  Had gallbladder removed.  Recheck liver panel today.        Relevant Orders   Hepatic function panel (Completed)   Choledocholithiasis    S/p removal of gallbladder.  Doing well.  Follow.       Fluttering sensation of heart    Desires no further w/up.  Does not happen often.  Follow.       Relevant Orders   TSH (Completed)   CBC with Differential/Platelet (Completed)   Hypercholesteremia - Primary    Low cholesterol diet and exercise.  Follow lipid panel.        Relevant Orders   Lipid panel (Completed)   Basic metabolic panel (Completed)   Obstructive sleep apnea    CPAP.       Viral URI with cough    Has sore throat  with drainage and some cough.  Hold abx.  Saline nasal spray and nasacort as directed.  Rest.  Fluids.  Follow.  Notify me if any worsening or change in symptoms.         Other Visit Diagnoses   None.      Einar Pheasant, MD

## 2015-08-12 ENCOUNTER — Other Ambulatory Visit: Payer: Self-pay | Admitting: Internal Medicine

## 2015-08-12 DIAGNOSIS — R739 Hyperglycemia, unspecified: Secondary | ICD-10-CM

## 2015-08-14 ENCOUNTER — Encounter: Payer: Self-pay | Admitting: Internal Medicine

## 2015-08-14 NOTE — Assessment & Plan Note (Signed)
Abdominal ultrasound revealed no acute abnormality.  Had gallbladder removed.  Recheck liver panel today.

## 2015-08-14 NOTE — Assessment & Plan Note (Signed)
Desires no further w/up.  Does not happen often.  Follow.

## 2015-08-14 NOTE — Assessment & Plan Note (Signed)
S/p removal of gallbladder.  Doing well.  Follow.

## 2015-08-14 NOTE — Assessment & Plan Note (Signed)
Low cholesterol diet and exercise.  Follow lipid panel.   

## 2015-08-14 NOTE — Assessment & Plan Note (Signed)
CPAP.  

## 2015-08-14 NOTE — Assessment & Plan Note (Signed)
Has sore throat with drainage and some cough.  Hold abx.  Saline nasal spray and nasacort as directed.  Rest.  Fluids.  Follow.  Notify me if any worsening or change in symptoms.

## 2015-09-09 ENCOUNTER — Other Ambulatory Visit (INDEPENDENT_AMBULATORY_CARE_PROVIDER_SITE_OTHER): Payer: Medicare Other

## 2015-09-09 DIAGNOSIS — R739 Hyperglycemia, unspecified: Secondary | ICD-10-CM

## 2015-09-09 LAB — HEMOGLOBIN A1C: Hgb A1c MFr Bld: 5.7 % (ref 4.6–6.5)

## 2015-09-10 ENCOUNTER — Encounter: Payer: Self-pay | Admitting: Internal Medicine

## 2015-09-10 LAB — GLUCOSE, FASTING: Glucose, Fasting: 112 mg/dL — ABNORMAL HIGH (ref 65–99)

## 2015-10-13 DIAGNOSIS — D2271 Melanocytic nevi of right lower limb, including hip: Secondary | ICD-10-CM | POA: Diagnosis not present

## 2015-10-13 DIAGNOSIS — D485 Neoplasm of uncertain behavior of skin: Secondary | ICD-10-CM | POA: Diagnosis not present

## 2015-10-13 DIAGNOSIS — Z85828 Personal history of other malignant neoplasm of skin: Secondary | ICD-10-CM | POA: Diagnosis not present

## 2015-10-13 DIAGNOSIS — D2262 Melanocytic nevi of left upper limb, including shoulder: Secondary | ICD-10-CM | POA: Diagnosis not present

## 2015-10-13 DIAGNOSIS — D225 Melanocytic nevi of trunk: Secondary | ICD-10-CM | POA: Diagnosis not present

## 2015-10-13 DIAGNOSIS — L820 Inflamed seborrheic keratosis: Secondary | ICD-10-CM | POA: Diagnosis not present

## 2015-10-20 DIAGNOSIS — Z23 Encounter for immunization: Secondary | ICD-10-CM | POA: Diagnosis not present

## 2015-10-28 DIAGNOSIS — H40153 Residual stage of open-angle glaucoma, bilateral: Secondary | ICD-10-CM | POA: Diagnosis not present

## 2015-11-22 ENCOUNTER — Telehealth: Payer: Self-pay | Admitting: *Deleted

## 2015-11-22 NOTE — Telephone Encounter (Signed)
Pt questioned if she could receive a pneumonia shot while she has a sinus infection  Pt contact [phone]

## 2015-11-22 NOTE — Telephone Encounter (Signed)
Patient notified and states she wants to cancel her appmt for wellness visit because she wants her pneumonia vaccine and she does not want to come in twice

## 2015-11-26 ENCOUNTER — Ambulatory Visit: Payer: Medicare Other

## 2015-12-01 DIAGNOSIS — G4733 Obstructive sleep apnea (adult) (pediatric): Secondary | ICD-10-CM | POA: Diagnosis not present

## 2015-12-07 ENCOUNTER — Ambulatory Visit (INDEPENDENT_AMBULATORY_CARE_PROVIDER_SITE_OTHER): Payer: Medicare Other

## 2015-12-07 DIAGNOSIS — Z23 Encounter for immunization: Secondary | ICD-10-CM | POA: Diagnosis not present

## 2015-12-07 NOTE — Progress Notes (Addendum)
Patient comes in for Pneumococcal 23 vaccine .  Injected left deltoid patient tolerated injection well.     Reviewed.  Dr Nicki Reaper

## 2016-02-11 ENCOUNTER — Ambulatory Visit (INDEPENDENT_AMBULATORY_CARE_PROVIDER_SITE_OTHER): Payer: Medicare Other | Admitting: Internal Medicine

## 2016-02-11 ENCOUNTER — Encounter: Payer: Self-pay | Admitting: Internal Medicine

## 2016-02-11 VITALS — BP 124/88 | HR 74 | Temp 98.3°F | Wt 156.1 lb

## 2016-02-11 DIAGNOSIS — G4733 Obstructive sleep apnea (adult) (pediatric): Secondary | ICD-10-CM

## 2016-02-11 DIAGNOSIS — E7800 Pure hypercholesterolemia, unspecified: Secondary | ICD-10-CM | POA: Diagnosis not present

## 2016-02-11 DIAGNOSIS — R748 Abnormal levels of other serum enzymes: Secondary | ICD-10-CM | POA: Diagnosis not present

## 2016-02-11 DIAGNOSIS — R739 Hyperglycemia, unspecified: Secondary | ICD-10-CM

## 2016-02-11 DIAGNOSIS — Z8371 Family history of colonic polyps: Secondary | ICD-10-CM

## 2016-02-11 DIAGNOSIS — R030 Elevated blood-pressure reading, without diagnosis of hypertension: Secondary | ICD-10-CM

## 2016-02-11 LAB — BASIC METABOLIC PANEL
BUN: 16 mg/dL (ref 6–23)
CALCIUM: 9.9 mg/dL (ref 8.4–10.5)
CO2: 31 mEq/L (ref 19–32)
Chloride: 104 mEq/L (ref 96–112)
Creatinine, Ser: 0.88 mg/dL (ref 0.40–1.20)
GFR: 67.64 mL/min (ref >60.00)
Glucose, Bld: 106 mg/dL — ABNORMAL HIGH (ref 70–99)
POTASSIUM: 4.5 meq/L (ref 3.5–5.1)
Sodium: 140 mEq/L (ref 135–145)

## 2016-02-11 LAB — HEPATIC FUNCTION PANEL
ALT: 22 U/L (ref 0–35)
AST: 23 U/L (ref 0–37)
Albumin: 4.7 g/dL (ref 3.5–5.2)
Alkaline Phosphatase: 59 U/L (ref 39–117)
BILIRUBIN DIRECT: 0.2 mg/dL (ref 0.0–0.3)
Total Bilirubin: 1 mg/dL (ref 0.2–1.2)
Total Protein: 7.5 g/dL (ref 6.0–8.3)

## 2016-02-11 LAB — LIPID PANEL
CHOLESTEROL: 168 mg/dL (ref 0–200)
HDL: 44.7 mg/dL (ref >39.00)
LDL CALC: 94 mg/dL (ref 0–99)
NonHDL: 122.96
TRIGLYCERIDES: 147 mg/dL (ref 0.0–149.0)
Total CHOL/HDL Ratio: 4
VLDL: 29.4 mg/dL (ref 0.0–40.0)

## 2016-02-11 LAB — HEMOGLOBIN A1C: Hgb A1c MFr Bld: 5.6 % (ref 4.6–6.5)

## 2016-02-11 NOTE — Progress Notes (Signed)
Patient ID: Dawn Wolfe, female   DOB: Sep 17, 1945, 71 y.o.   MRN: [id number]   Subjective:    Patient ID: Dawn Wolfe, female    DOB: [birth year], 71 y.o.   MRN: [id number]  HPI  Patient here for a scheduled follow up.  States she is doing well.  Feels good.  Stays active.  Discussed continued diet and exercise.  No chest pain.  No sob.  No acid reflux.  No abdominal pain or cramping.  Bowels stable.  Has two sisters with polyps.  Overdue colonoscopy.     Past Medical History:  Diagnosis Date  . Anemia    h/o as a child  . Arthritis    left shoulder  . 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 Surgical removal  . Complication of anesthesia   . Hemorrhoids   . History of colonoscopy 2003   Normal, Dr. Sammuel Cooper, [place]   . Hypercholesterolemia   . Normal exercise sestamibi stress test 2008   Ken Fath  . Obstructive sleep apnea on CPAP 2007  . PONV (postoperative nausea and vomiting)   . Rotator cuff tear, left   . Sleep apnea    Past Surgical History:  Procedure Laterality Date  . ABDOMINALPLASTY W/ LIPOSUCTION  AGE 59  . BUNIONECTOMY  X3  LAST ONE 2003  . CHOLECYSTECTOMY N/A 04/19/2015   Procedure: LAPAROSCOPIC CHOLECYSTECTOMY WITH INTRAOPERATIVE CHOLANGIOGRAM;  Surgeon: Robert Bellow, MD;  Location: [facility] ORS;  Service: General;  Laterality: N/A;  . COLONOSCOPY  2012   Dr Wyline Mood in [place]  . ERCP N/A 04/29/2015   Procedure: ENDOSCOPIC RETROGRADE CHOLANGIOPANCREATOGRAPHY (ERCP);  Surgeon: Hulen Luster, MD;  Location: [facility] ENDOSCOPY;  Service: Gastroenterology;  Laterality: N/A;  . NASAL SEPTUM SURGERY  AGE 59  . NASAL SINUS SURGERY  AGE 68  . SHOULDER ARTHROSCOPY  10/25/2011   Procedure: ARTHROSCOPY SHOULDER;  Surgeon: Magnus Sinning, MD;  Location: [facility];  Service: Orthopedics;  Laterality: Left;  with SAD, shave of the labrium   . TUBAL LIGATION  1982   Family History  Problem Relation Age of Onset  . Acute myelogenous  leukemia Sister   . Polycythemia Sister 65  . Emphysema Father     smoker  . Heart disease Father     myocardial infarction - 33  . Breast cancer Paternal Aunt   . Testicular cancer Brother   . Heart disease Mother   . Emphysema Mother   . Leukemia Sister   . Colon cancer Neg Hx    Social History   Social History  . Marital status: Married    Spouse name: N/A  . Number of children: N/A  . Years of education: N/A   Social History Main Topics  . Smoking status: Former Smoker    Packs/day: 2.50    Years: 10.00    Types: Cigarettes    Quit date: 10/26/1978  . Smokeless tobacco: Never Used  . Alcohol use No  . Drug use: No  . Sexual activity: No   Other Topics Concern  . None   Social History Narrative  . None    Outpatient Encounter Prescriptions as of 02/11/2016  Medication Sig  . Probiotic Product (PROBIOTIC-10) CAPS Take by mouth daily.  Marland Kitchen aspirin 81 MG tablet Take 81 mg by mouth daily.   . fish oil-omega-3 fatty acids 1000 MG capsule Take 1 g by mouth daily.   . fluticasone (FLONASE) 50 MCG/ACT  nasal spray Place 2 sprays into both nostrils daily. (Patient taking differently: Place 2 sprays into both nostrils as needed. )  . Multiple Vitamin (MULTIVITAMIN) tablet Take 1 tablet by mouth daily.   . pravastatin (PRAVACHOL) 10 MG tablet TAKE ONE TABLET BY MOUTH EVERY DAY (Patient taking differently: TAKE ONE TABLET BY MOUTH EVERY DAY-pm)   No facility-administered encounter medications on file as of 02/11/2016.     Review of Systems  Constitutional: Negative for appetite change and unexpected weight change.  HENT: Negative for congestion and sinus pressure.   Respiratory: Negative for cough, chest tightness and shortness of breath.   Cardiovascular: Negative for chest pain, palpitations and leg swelling.  Gastrointestinal: Negative for abdominal pain, nausea and vomiting.  Genitourinary: Negative for difficulty urinating and dysuria.  Musculoskeletal: Negative for  joint swelling and myalgias.  Skin: Negative for color change and rash.  Neurological: Negative for dizziness, light-headedness and headaches.  Psychiatric/Behavioral: Negative for agitation and dysphoric mood.       Objective:    Physical Exam  Constitutional: She appears well-developed and well-nourished. No distress.  HENT:  Nose: Nose normal.  Mouth/Throat: Oropharynx is clear and moist.  Neck: Neck supple. No thyromegaly present.  Cardiovascular: Normal rate and regular rhythm.   Pulmonary/Chest: Breath sounds normal. No respiratory distress. She has no wheezes.  Abdominal: Soft. Bowel sounds are normal. There is no tenderness.  Musculoskeletal: She exhibits no edema or tenderness.  Lymphadenopathy:    She has no cervical adenopathy.  Skin: No rash noted. No erythema.  Psychiatric: She has a normal mood and affect. Her behavior is normal.    BP 124/88 (BP Location: Left Arm, Patient Position: Sitting, Cuff Size: Normal)   Pulse 74   Temp 98.3 F (36.8 C) (Oral)   Wt 156 lb 2 oz (70.8 kg)   SpO2 96%   BMI 25.98 kg/m  Wt Readings from Last 3 Encounters:  02/11/16 156 lb 2 oz (70.8 kg)  08/11/15 159 lb (72.1 kg)  04/29/15 151 lb (68.5 kg)     Lab Results  Component Value Date   WBC 7.1 08/11/2015   HGB 12.7 08/11/2015   HCT 37.2 08/11/2015   PLT 161.0 08/11/2015   GLUCOSE 106 (H) 02/11/2016   CHOL 168 02/11/2016   TRIG 147.0 02/11/2016   HDL 44.70 02/11/2016   LDLDIRECT 88.0 07/22/2014   LDLCALC 94 02/11/2016   ALT 22 02/11/2016   AST 23 02/11/2016   NA 140 02/11/2016   K 4.5 02/11/2016   CL 104 02/11/2016   CREATININE 0.88 02/11/2016   BUN 16 02/11/2016   CO2 31 02/11/2016   TSH 1.50 08/11/2015   HGBA1C 5.6 02/11/2016    Mm Screening Breast Tomo Bilateral  Result Date: 07/21/2015 CLINICAL DATA:  Screening. EXAM: 2D DIGITAL SCREENING BILATERAL MAMMOGRAM WITH CAD AND ADJUNCT TOMO COMPARISON:  Previous exam(s). ACR Breast Density Category c: The breast  tissue is heterogeneously dense, which may obscure small masses. FINDINGS: There are no findings suspicious for malignancy. Images were processed with CAD. IMPRESSION: No mammographic evidence of malignancy. A result letter of this screening mammogram will be mailed directly to the patient. RECOMMENDATION: Screening mammogram in one year. (Code:SM-B-01Y) BI-RADS CATEGORY  1: Negative. Electronically Signed   By: Pamelia Hoit M.D.   On: 07/21/2015 18:47       Assessment & Plan:   Problem List Items Addressed This Visit    Abnormal liver enzymes    Abdominal ultrasound revealed no acute abnormality.  Had gallbladder removed. Recheck liver panel.       Relevant Orders   Hepatic function panel (Completed)   Elevated blood pressure reading    On recheck improved.  Follow.        Family history of colonic polyps    Pt has a sister with colon polyps.  She is overdue.  Refer to GI.        Relevant Orders   Ambulatory referral to Gastroenterology   Hypercholesteremia - Primary    On pravastatin.  Low cholesterol diet and exercise.  Follow lipid panel and liver function tests.        Relevant Orders   Lipid panel (Completed)   Hyperglycemia    Low carb diet and exercise.  Follow met b and a1c.        Relevant Orders   Hemoglobin A1c (Completed)   Basic metabolic panel (Completed)   Obstructive sleep apnea    Continue with CPAP.  Uses regularly.  Sleeps well.           Einar Pheasant, MD

## 2016-02-11 NOTE — Progress Notes (Signed)
Pre visit review using our clinic review tool, if applicable. No additional management support is needed unless otherwise documented below in the visit note. 

## 2016-02-13 ENCOUNTER — Encounter: Payer: Self-pay | Admitting: Internal Medicine

## 2016-02-13 DIAGNOSIS — Z8371 Family history of colonic polyps: Secondary | ICD-10-CM | POA: Insufficient documentation

## 2016-02-13 NOTE — Assessment & Plan Note (Signed)
Continue with CPAP.  Uses regularly.  Sleeps well.

## 2016-02-13 NOTE — Assessment & Plan Note (Signed)
Abdominal ultrasound revealed no acute abnormality.  Had gallbladder removed. Recheck liver panel.

## 2016-02-13 NOTE — Assessment & Plan Note (Signed)
On pravastatin.  Low cholesterol diet and exercise.  Follow lipid panel and liver function tests.   

## 2016-02-13 NOTE — Assessment & Plan Note (Signed)
Pt has a sister with colon polyps.  She is overdue.  Refer to GI.

## 2016-02-13 NOTE — Assessment & Plan Note (Signed)
On recheck improved.  Follow.

## 2016-02-13 NOTE — Assessment & Plan Note (Signed)
Low carb diet and exercise.  Follow met b and a1c.   

## 2016-02-14 ENCOUNTER — Encounter: Payer: Self-pay | Admitting: Internal Medicine

## 2016-03-21 DIAGNOSIS — H40153 Residual stage of open-angle glaucoma, bilateral: Secondary | ICD-10-CM | POA: Diagnosis not present

## 2016-03-28 ENCOUNTER — Other Ambulatory Visit: Payer: Self-pay | Admitting: Internal Medicine

## 2016-04-05 DIAGNOSIS — G4733 Obstructive sleep apnea (adult) (pediatric): Secondary | ICD-10-CM | POA: Diagnosis not present

## 2016-04-25 ENCOUNTER — Other Ambulatory Visit: Payer: Self-pay | Admitting: Internal Medicine

## 2016-04-25 DIAGNOSIS — Z1231 Encounter for screening mammogram for malignant neoplasm of breast: Secondary | ICD-10-CM

## 2016-04-28 DIAGNOSIS — Z86010 Personal history of colonic polyps: Secondary | ICD-10-CM | POA: Diagnosis not present

## 2016-06-29 ENCOUNTER — Other Ambulatory Visit: Payer: Self-pay | Admitting: Internal Medicine

## 2016-07-12 DIAGNOSIS — G4733 Obstructive sleep apnea (adult) (pediatric): Secondary | ICD-10-CM | POA: Diagnosis not present

## 2016-07-24 ENCOUNTER — Ambulatory Visit
Admission: RE | Admit: 2016-07-24 | Discharge: 2016-07-24 | Disposition: A | Discharge status: Home / Self Care | Payer: Medicare Other | Source: Ambulatory Visit | Attending: Internal Medicine | Admitting: Internal Medicine

## 2016-07-24 ENCOUNTER — Encounter: Payer: Self-pay | Admitting: Internal Medicine

## 2016-07-24 DIAGNOSIS — Z1231 Encounter for screening mammogram for malignant neoplasm of breast: Secondary | ICD-10-CM | POA: Diagnosis not present

## 2016-07-24 LAB — HM MAMMOGRAPHY

## 2016-07-31 DIAGNOSIS — H40153 Residual stage of open-angle glaucoma, bilateral: Secondary | ICD-10-CM | POA: Diagnosis not present

## 2016-08-02 ENCOUNTER — Telehealth: Payer: Self-pay | Admitting: Internal Medicine

## 2016-08-02 DIAGNOSIS — R748 Abnormal levels of other serum enzymes: Secondary | ICD-10-CM

## 2016-08-02 DIAGNOSIS — E7800 Pure hypercholesterolemia, unspecified: Secondary | ICD-10-CM

## 2016-08-02 DIAGNOSIS — R739 Hyperglycemia, unspecified: Secondary | ICD-10-CM

## 2016-08-02 NOTE — Telephone Encounter (Signed)
Called patient she is having some fluctuation in  bp readings. She could not give me any ranges but did say that it dropped down to 100/85 at one pont. She does have a CPE app on 7/26. She wanted to know if she needed to have lab work done that her husband is coming in in the am for fasting labs and would like to do at that time.I have added her to the lab schedule for fasting labs in the am. Informed that I would send message to you to see if labs were needed if not I will call and let her know app has been scheduled. If she does we just need order. I have also asked that she take her BP at random times until physical and bring record to that appointment.

## 2016-08-02 NOTE — Telephone Encounter (Signed)
Labs ordered.  Agree with monitoring blood pressure.  Confirm no other acute symptoms or problems.

## 2016-08-02 NOTE — Telephone Encounter (Signed)
Per conversation no other symptoms.

## 2016-08-02 NOTE — Telephone Encounter (Signed)
Pt wanted to know if she needed labs before her appt next week. Please advise  Pt also has a some questions about blood sugar and BP on what is the normal range

## 2016-08-03 ENCOUNTER — Other Ambulatory Visit (INDEPENDENT_AMBULATORY_CARE_PROVIDER_SITE_OTHER): Payer: Medicare Other

## 2016-08-03 DIAGNOSIS — R748 Abnormal levels of other serum enzymes: Secondary | ICD-10-CM

## 2016-08-03 DIAGNOSIS — E7800 Pure hypercholesterolemia, unspecified: Secondary | ICD-10-CM | POA: Diagnosis not present

## 2016-08-03 DIAGNOSIS — R739 Hyperglycemia, unspecified: Secondary | ICD-10-CM

## 2016-08-03 LAB — LIPID PANEL
CHOLESTEROL: 156 mg/dL (ref 0–200)
HDL: 44 mg/dL (ref >39.00)
LDL Cholesterol: 80 mg/dL (ref 0–99)
NonHDL: 112.08
TRIGLYCERIDES: 160 mg/dL — AB (ref 0.0–149.0)
Total CHOL/HDL Ratio: 4
VLDL: 32 mg/dL (ref 0.0–40.0)

## 2016-08-03 LAB — HEPATIC FUNCTION PANEL
ALBUMIN: 4.5 g/dL (ref 3.5–5.2)
ALT: 27 U/L (ref 0–35)
AST: 26 U/L (ref 0–37)
Alkaline Phosphatase: 62 U/L (ref 39–117)
Bilirubin, Direct: 0.1 mg/dL (ref 0.0–0.3)
TOTAL PROTEIN: 7.4 g/dL (ref 6.0–8.3)
Total Bilirubin: 0.8 mg/dL (ref 0.2–1.2)

## 2016-08-03 LAB — CBC WITH DIFFERENTIAL/PLATELET
Basophils Absolute: 0 10*3/uL (ref 0.0–0.1)
Basophils Relative: 0.8 % (ref 0.0–3.0)
EOS PCT: 4.6 % (ref 0.0–5.0)
Eosinophils Absolute: 0.2 10*3/uL (ref 0.0–0.7)
HCT: 40.5 % (ref 36.0–46.0)
Hemoglobin: 13.8 g/dL (ref 12.0–15.0)
LYMPHS ABS: 1.8 10*3/uL (ref 0.7–4.0)
Lymphocytes Relative: 33.9 % (ref 12.0–46.0)
MCHC: 34.2 g/dL (ref 30.0–36.0)
MCV: 87.7 fl (ref 78.0–100.0)
MONO ABS: 0.3 10*3/uL (ref 0.1–1.0)
MONOS PCT: 6.3 % (ref 3.0–12.0)
NEUTROS ABS: 2.9 10*3/uL (ref 1.4–7.7)
NEUTROS PCT: 54.4 % (ref 43.0–77.0)
PLATELETS: 167 10*3/uL (ref 150.0–400.0)
RBC: 4.62 Mil/uL (ref 3.87–5.11)
RDW: 13.2 % (ref 11.5–15.5)
WBC: 5.3 10*3/uL (ref 4.0–10.5)

## 2016-08-03 LAB — BASIC METABOLIC PANEL
BUN: 17 mg/dL (ref 6–23)
CHLORIDE: 104 meq/L (ref 96–112)
CO2: 30 mEq/L (ref 19–32)
Calcium: 10.1 mg/dL (ref 8.4–10.5)
Creatinine, Ser: 0.95 mg/dL (ref 0.40–1.20)
GFR: 61.84 mL/min (ref >60.00)
Glucose, Bld: 121 mg/dL — ABNORMAL HIGH (ref 70–99)
POTASSIUM: 4.6 meq/L (ref 3.5–5.1)
Sodium: 139 mEq/L (ref 135–145)

## 2016-08-03 LAB — TSH: TSH: 2.23 u[IU]/mL (ref 0.35–4.50)

## 2016-08-03 LAB — HEMOGLOBIN A1C: HEMOGLOBIN A1C: 5.9 % (ref 4.6–6.5)

## 2016-08-04 ENCOUNTER — Encounter: Payer: Self-pay | Admitting: Internal Medicine

## 2016-08-10 ENCOUNTER — Ambulatory Visit (INDEPENDENT_AMBULATORY_CARE_PROVIDER_SITE_OTHER): Payer: Medicare Other | Admitting: Internal Medicine

## 2016-08-10 ENCOUNTER — Encounter: Payer: Self-pay | Admitting: Internal Medicine

## 2016-08-10 VITALS — BP 124/86 | HR 77 | Temp 98.6°F | Resp 12 | Ht 65.5 in | Wt 156.4 lb

## 2016-08-10 DIAGNOSIS — R519 Headache, unspecified: Secondary | ICD-10-CM

## 2016-08-10 DIAGNOSIS — R739 Hyperglycemia, unspecified: Secondary | ICD-10-CM

## 2016-08-10 DIAGNOSIS — E7800 Pure hypercholesterolemia, unspecified: Secondary | ICD-10-CM

## 2016-08-10 DIAGNOSIS — R51 Headache: Secondary | ICD-10-CM | POA: Diagnosis not present

## 2016-08-10 DIAGNOSIS — R031 Nonspecific low blood-pressure reading: Secondary | ICD-10-CM | POA: Diagnosis not present

## 2016-08-10 DIAGNOSIS — Z0000 Encounter for general adult medical examination without abnormal findings: Secondary | ICD-10-CM

## 2016-08-10 DIAGNOSIS — R748 Abnormal levels of other serum enzymes: Secondary | ICD-10-CM

## 2016-08-10 DIAGNOSIS — G4733 Obstructive sleep apnea (adult) (pediatric): Secondary | ICD-10-CM | POA: Diagnosis not present

## 2016-08-10 NOTE — Progress Notes (Signed)
Patient ID: Dawn Wolfe, female   DOB: 1945-03-20, 71 y.o.   MRN: [id number]   Subjective:    Patient ID: Dawn Wolfe, female    DOB: May 25, 1945, 71 y.o.   MRN: [id number]  HPI  Patient here for her physical exam.  She is accompanied by her husband.  History obtained from both of them.  She reports she has been having some intermittent dull headaches.  Intermittent frontal headache.  Not severe.  No significant sinus congestion.  Has been refinishing a piece of furniture.  We discussed the fumes could be contributing to her headache.  Also discussed getting her eyes checked.  No chest pain.  No sob.  No acid reflux.  No abdominal pain.  Bowels moving.  Had noticed some left thigh aching.  Is better.  Some previous cramps.  Discussed stretches.     Past Medical History:  Diagnosis Date  . Anemia    h/o as a child  . Arthritis    left shoulder  . Cancer ([place]) 15 yrs ago   [employer] on the face. Surgical removal  . Complication of anesthesia   . Hemorrhoids   . History of colonoscopy 2003   Normal, Dr. Sammuel Cooper, [place]   . Hypercholesterolemia   . Normal exercise sestamibi stress test 2008   Ken Fath  . Obstructive sleep apnea on CPAP 2007  . PONV (postoperative nausea and vomiting)   . Rotator cuff tear, left   . Sleep apnea    Past Surgical History:  Procedure Laterality Date  . ABDOMINALPLASTY W/ LIPOSUCTION  AGE 47  . BUNIONECTOMY  X3  LAST ONE 2003  . CHOLECYSTECTOMY N/A 04/19/2015   Procedure: LAPAROSCOPIC CHOLECYSTECTOMY WITH INTRAOPERATIVE CHOLANGIOGRAM;  Surgeon: Robert Bellow, MD;  Location: [facility] ORS;  Service: General;  Laterality: N/A;  . COLONOSCOPY  2012   Dr Wyline Mood in [place]  . ERCP N/A 04/29/2015   Procedure: ENDOSCOPIC RETROGRADE CHOLANGIOPANCREATOGRAPHY (ERCP);  Surgeon: Hulen Luster, MD;  Location: [facility] ENDOSCOPY;  Service: Gastroenterology;  Laterality: N/A;  . NASAL SEPTUM SURGERY  AGE 24  . NASAL SINUS SURGERY  AGE 44  . SHOULDER  ARTHROSCOPY  10/25/2011   Procedure: ARTHROSCOPY SHOULDER;  Surgeon: Magnus Sinning, MD;  Location: [facility];  Service: Orthopedics;  Laterality: Left;  with SAD, shave of the labrium   . TUBAL LIGATION  1982   Family History  Problem Relation Age of Onset  . Acute myelogenous leukemia Sister   . Polycythemia Sister 28  . Emphysema Father        smoker  . Heart disease Father        myocardial infarction - 73  . Breast cancer Paternal Aunt   . Testicular cancer Brother   . Heart disease Mother   . Emphysema Mother   . Leukemia Sister   . Colon cancer Neg Hx    Social History   Social History  . Marital status: Married    Spouse name: N/A  . Number of children: N/A  . Years of education: N/A   Social History Main Topics  . Smoking status: Former Smoker    Packs/day: 2.50    Years: 10.00    Types: Cigarettes    Quit date: 10/26/1978  . Smokeless tobacco: Never Used  . Alcohol use No  . Drug use: No  . Sexual activity: No   Other Topics Concern  . None   Social History Narrative  . None  Outpatient Encounter Prescriptions as of 08/10/2016  Medication Sig  . aspirin 81 MG tablet Take 81 mg by mouth daily.   . fluticasone (FLONASE) 50 MCG/ACT nasal spray Place 2 sprays into both nostrils daily. (Patient taking differently: Place 2 sprays into both nostrils as needed. )  . latanoprost (XALATAN) 0.005 % ophthalmic solution Place 1 drop into both eyes.  . Multiple Vitamin (MULTIVITAMIN) tablet Take 1 tablet by mouth daily.   . pravastatin (PRAVACHOL) 10 MG tablet TAKE ONE TABLET EVERY DAY  . [DISCONTINUED] fish oil-omega-3 fatty acids 1000 MG capsule Take 1 g by mouth daily.   . [DISCONTINUED] Probiotic Product (PROBIOTIC-10) CAPS Take by mouth daily.   No facility-administered encounter medications on file as of 08/10/2016.     Review of Systems  Constitutional: Negative for appetite change and unexpected weight change.  HENT: Negative for  congestion and sinus pressure.   Eyes: Negative for pain and visual disturbance.  Respiratory: Negative for cough, chest tightness and shortness of breath.   Cardiovascular: Negative for chest pain, palpitations and leg swelling.  Gastrointestinal: Negative for abdominal pain, diarrhea, nausea and vomiting.  Genitourinary: Negative for difficulty urinating and dysuria.  Musculoskeletal: Negative for back pain and joint swelling.  Skin: Negative for color change and rash.  Neurological: Positive for headaches. Negative for dizziness and light-headedness.  Hematological: Negative for adenopathy. Does not bruise/bleed easily.  Psychiatric/Behavioral: Negative for agitation and dysphoric mood.       Objective:     Blood pressure rechecked by me:  128-130/82  Physical Exam  Constitutional: She is oriented to person, place, and time. She appears well-developed and well-nourished. No distress.  HENT:  Nose: Nose normal.  Mouth/Throat: Oropharynx is clear and moist.  Eyes: Right eye exhibits no discharge. Left eye exhibits no discharge. No scleral icterus.  Neck: Neck supple. No thyromegaly present.  Cardiovascular: Normal rate and regular rhythm.   Pulmonary/Chest: Breath sounds normal. No accessory muscle usage. No tachypnea. No respiratory distress. She has no decreased breath sounds. She has no wheezes. She has no rhonchi. Right breast exhibits no inverted nipple, no mass, no nipple discharge and no tenderness (no axillary adenopathy). Left breast exhibits no inverted nipple, no mass, no nipple discharge and no tenderness (no axilarry adenopathy).  Abdominal: Soft. Bowel sounds are normal. There is no tenderness.  Musculoskeletal: She exhibits no edema or tenderness.  Lymphadenopathy:    She has no cervical adenopathy.  Neurological: She is alert and oriented to person, place, and time.  Skin: Skin is warm. No rash noted. No erythema.  Psychiatric: She has a normal mood and affect. Her  behavior is normal.    BP 124/86   Pulse 77   Temp 98.6 F (37 C) (Oral)   Resp 12   Ht 5' 5.5" (1.664 m)   Wt 156 lb 6.4 oz (70.9 kg)   SpO2 97%   BMI 25.63 kg/m  Wt Readings from Last 3 Encounters:  08/10/16 156 lb 6.4 oz (70.9 kg)  02/11/16 156 lb 2 oz (70.8 kg)  08/11/15 159 lb (72.1 kg)     Lab Results  Component Value Date   WBC 5.3 08/03/2016   HGB 13.8 08/03/2016   HCT 40.5 08/03/2016   PLT 167.0 08/03/2016   GLUCOSE 121 (H) 08/03/2016   CHOL 156 08/03/2016   TRIG 160.0 (H) 08/03/2016   HDL 44.00 08/03/2016   LDLDIRECT 88.0 07/22/2014   LDLCALC 80 08/03/2016   ALT 27 08/03/2016   AST 26  08/03/2016   NA 139 08/03/2016   K 4.6 08/03/2016   CL 104 08/03/2016   CREATININE 0.95 08/03/2016   BUN 17 08/03/2016   CO2 30 08/03/2016   TSH 2.23 08/03/2016   HGBA1C 5.9 08/03/2016    Mm Screening Breast Tomo Bilateral  Result Date: 07/24/2016 CLINICAL DATA:  Screening. EXAM: 2D DIGITAL SCREENING BILATERAL MAMMOGRAM WITH CAD AND ADJUNCT TOMO COMPARISON:  Previous exam(s). ACR Breast Density Category c: The breast tissue is heterogeneously dense, which may obscure small masses. FINDINGS: There are no findings suspicious for malignancy. Images were processed with CAD. IMPRESSION: No mammographic evidence of malignancy. A result letter of this screening mammogram will be mailed directly to the patient. RECOMMENDATION: Screening mammogram in one year. (Code:SM-B-01Y) BI-RADS CATEGORY  1: Negative. Electronically Signed   By: Fidela Salisbury M.D.   On: 07/24/2016 14:23       Assessment & Plan:   Problem List Items Addressed This Visit    Abnormal liver enzymes    Abdominal ultrasound revealed no acute abnormality.  Had gallbladder removed.  Follow liver panel.        Headache    Headache as outlined.  Discussed with her today.  Will avoid refinishing the furniture.  Discussed getting her eyes checked.  Check esr.  Follow closely.  Hold on scanning.  Call with update.          Health care maintenance    Physical today 08/10/16.  Mammogram 07/24/16 - birads I.  Colonoscopy planned for 09/11/16.        Hypercholesteremia    On pravastatin.  Low cholesterol diet and exercise.  Follow lipid panel and liver function tests.        Hyperglycemia    Low carb diet and exercise.  Follow met b and a1c.       Obstructive sleep apnea    Continue with CPAP.         Other Visit Diagnoses    Routine general medical examination at a health care facility    -  Primary   Drop in blood pressure       She is concerned about her blood pressure being too low.  Reviewed outside checks.  She is checking with a large cuff.  Recheck here ok.  Follow.        Einar Pheasant, MD

## 2016-08-10 NOTE — Progress Notes (Signed)
Pre-visit discussion using our clinic review tool. No additional management support is needed unless otherwise documented below in the visit note.  

## 2016-08-10 NOTE — Assessment & Plan Note (Signed)
Physical today 08/10/16.  Mammogram 07/24/16 - birads I.  Colonoscopy planned for 09/11/16.

## 2016-08-11 ENCOUNTER — Encounter: Payer: Self-pay | Admitting: *Deleted

## 2016-08-11 ENCOUNTER — Other Ambulatory Visit: Payer: Medicare Other

## 2016-08-11 ENCOUNTER — Telehealth: Payer: Self-pay | Admitting: *Deleted

## 2016-08-11 NOTE — Telephone Encounter (Signed)
Received a call at 10: 50 this morning from Santiago Glad at [facility]. They are unable to run the Sed Rate collected yesterday morning because they are out of cartridges to run test. They were suppose to come in this morning & never received them. Pt will now need to be recollected. Please let me know if you want patient to return for this today or next week.

## 2016-08-11 NOTE — Telephone Encounter (Signed)
Pt notified & has responded to her mychart.

## 2016-08-11 NOTE — Telephone Encounter (Signed)
Sent pt a mychart message & removed all lab charges

## 2016-08-11 NOTE — Telephone Encounter (Signed)
Please let pt know and if she is still having headaches, she will need to get this redrawn.

## 2016-08-12 ENCOUNTER — Encounter: Payer: Self-pay | Admitting: Internal Medicine

## 2016-08-12 DIAGNOSIS — R519 Headache, unspecified: Secondary | ICD-10-CM | POA: Insufficient documentation

## 2016-08-12 DIAGNOSIS — R51 Headache: Secondary | ICD-10-CM

## 2016-08-12 NOTE — Assessment & Plan Note (Signed)
Continue with CPAP

## 2016-08-12 NOTE — Assessment & Plan Note (Signed)
Headache as outlined.  Discussed with her today.  Will avoid refinishing the furniture.  Discussed getting her eyes checked.  Check esr.  Follow closely.  Hold on scanning.  Call with update.

## 2016-08-12 NOTE — Assessment & Plan Note (Signed)
Abdominal ultrasound revealed no acute abnormality.  Had gallbladder removed.  Follow liver panel.

## 2016-08-12 NOTE — Assessment & Plan Note (Signed)
Low carb diet and exercise.  Follow met b and a1c.  

## 2016-08-12 NOTE — Assessment & Plan Note (Signed)
On pravastatin.  Low cholesterol diet and exercise.  Follow lipid panel and liver function tests.   

## 2016-08-23 DIAGNOSIS — J301 Allergic rhinitis due to pollen: Secondary | ICD-10-CM | POA: Diagnosis not present

## 2016-08-23 DIAGNOSIS — R51 Headache: Secondary | ICD-10-CM | POA: Diagnosis not present

## 2016-08-23 DIAGNOSIS — G4733 Obstructive sleep apnea (adult) (pediatric): Secondary | ICD-10-CM | POA: Diagnosis not present

## 2016-09-11 ENCOUNTER — Ambulatory Visit
Admission: RE | Admit: 2016-09-11 | Discharge: 2016-09-11 | Disposition: A | Discharge status: Home / Self Care | Payer: Medicare Other | Source: Ambulatory Visit | Attending: Gastroenterology | Admitting: Gastroenterology

## 2016-09-11 ENCOUNTER — Ambulatory Visit: Payer: Medicare Other | Admitting: Anesthesiology

## 2016-09-11 ENCOUNTER — Encounter: Payer: Self-pay | Admitting: *Deleted

## 2016-09-11 ENCOUNTER — Encounter
Admission: RE | Disposition: A | Discharge status: Home / Self Care | Payer: Self-pay | Source: Ambulatory Visit | Attending: Gastroenterology

## 2016-09-11 DIAGNOSIS — K5730 Diverticulosis of large intestine without perforation or abscess without bleeding: Secondary | ICD-10-CM | POA: Insufficient documentation

## 2016-09-11 DIAGNOSIS — Z538 Procedure and treatment not carried out for other reasons: Secondary | ICD-10-CM | POA: Diagnosis not present

## 2016-09-11 DIAGNOSIS — K648 Other hemorrhoids: Secondary | ICD-10-CM | POA: Insufficient documentation

## 2016-09-11 DIAGNOSIS — Z86010 Personal history of colonic polyps: Secondary | ICD-10-CM | POA: Insufficient documentation

## 2016-09-11 DIAGNOSIS — Z7982 Long term (current) use of aspirin: Secondary | ICD-10-CM | POA: Insufficient documentation

## 2016-09-11 DIAGNOSIS — K5790 Diverticulosis of intestine, part unspecified, without perforation or abscess without bleeding: Secondary | ICD-10-CM | POA: Diagnosis not present

## 2016-09-11 DIAGNOSIS — Z79899 Other long term (current) drug therapy: Secondary | ICD-10-CM | POA: Diagnosis not present

## 2016-09-11 DIAGNOSIS — Z9049 Acquired absence of other specified parts of digestive tract: Secondary | ICD-10-CM | POA: Insufficient documentation

## 2016-09-11 DIAGNOSIS — E7800 Pure hypercholesterolemia, unspecified: Secondary | ICD-10-CM | POA: Insufficient documentation

## 2016-09-11 DIAGNOSIS — G4733 Obstructive sleep apnea (adult) (pediatric): Secondary | ICD-10-CM | POA: Diagnosis not present

## 2016-09-11 HISTORY — PX: COLONOSCOPY WITH PROPOFOL: SHX5780

## 2016-09-11 SURGERY — COLONOSCOPY WITH PROPOFOL
Anesthesia: General

## 2016-09-11 MED ORDER — PROPOFOL 500 MG/50ML IV EMUL
INTRAVENOUS | Status: AC
  Filled 2016-09-11: qty 50

## 2016-09-11 MED ORDER — SODIUM CHLORIDE 0.9 % IV SOLN
INTRAVENOUS | Status: DC
  Administered 2016-09-11 (×2): via INTRAVENOUS

## 2016-09-11 MED ORDER — PROPOFOL 10 MG/ML IV BOLUS
INTRAVENOUS | Status: DC | PRN
  Administered 2016-09-11: 100 mg via INTRAVENOUS

## 2016-09-11 MED ORDER — PROPOFOL 500 MG/50ML IV EMUL
INTRAVENOUS | Status: DC | PRN
  Administered 2016-09-11: 150 ug/kg/min via INTRAVENOUS

## 2016-09-11 MED ORDER — SODIUM CHLORIDE 0.9 % IV SOLN: INTRAVENOUS | Status: DC

## 2016-09-11 NOTE — Transfer of Care (Signed)
Immediate Anesthesia Transfer of Care Note  Patient: Dawn Wolfe  Procedure(s) Performed: Procedure(s): COLONOSCOPY WITH PROPOFOL (N/A)  Patient Location: PACU  Anesthesia Type:General  Level of Consciousness: awake and sedated  Airway & Oxygen Therapy: Patient Spontanous Breathing and Patient connected to nasal cannula oxygen  Post-op Assessment: Report given to RN and Post -op Vital signs reviewed and stable  Post vital signs: Reviewed and stable  Last Vitals:  Vitals:   09/11/16 1330  BP: (!) 153/84  Pulse: 93  Resp: 16  SpO2: 100%    Last Pain: There were no vitals filed for this visit.       Complications: No apparent anesthesia complications

## 2016-09-11 NOTE — H&P (Signed)
Outpatient short stay form Pre-procedure 09/11/2016 2:14 PM Lollie Sails MD  Primary Physician: Dr. Einar Pheasant  Reason for visit:  Colonoscopy  History of present illness:  Patient is a 71 year old female presenting today as above. She has alternating bowel habits sometimes more loose sometimes she will take MiraLAX. It is of note that she has had a cholecystectomy. She does take 81 mg aspirin that has been held for a couple of days. She takes no other aspirin products or blood thinning agents. She did have some issues with the prep causing her some nausea and she threw up some but states that she is relatively clear today with just yellow fluid no solid material.    Current Facility-Administered Medications:  .  0.9 %  sodium chloride infusion, , Intravenous, Continuous, Lollie Sails, MD, Last Rate: 20 mL/hr at 09/11/16 1349  Prescriptions Prior to Admission  Medication Sig Dispense Refill Last Dose  . aspirin 81 MG tablet Take 81 mg by mouth daily.    Past Week at Unknown time  . bimatoprost (LUMIGAN) 0.03 % ophthalmic solution 1 drop at bedtime.   09/10/2016 at Unknown time  . fluticasone (FLONASE) 50 MCG/ACT nasal spray Place 2 sprays into both nostrils daily. (Patient taking differently: Place 2 sprays into both nostrils as needed. ) 16 g 3 09/10/2016 at Unknown time  . latanoprost (XALATAN) 0.005 % ophthalmic solution Place 1 drop into both eyes.  3 09/10/2016 at Unknown time  . Multiple Vitamin (MULTIVITAMIN) tablet Take 1 tablet by mouth daily.    Past Week at Unknown time  . Omega-3 Fatty Acids (OMEGA-3 FISH OIL PO) Take by mouth.   Past Week at Unknown time  . pravastatin (PRAVACHOL) 10 MG tablet TAKE ONE TABLET EVERY DAY 30 tablet 3 09/10/2016 at Unknown time     Allergies  Allergen Reactions  . Codeine Nausea And Vomiting     Past Medical History:  Diagnosis Date  . Anemia    h/o as a child  . Arthritis    left shoulder  . Cancer ([place]) 15 yrs ago   [employer] on the face. Surgical removal  . Complication of anesthesia   . Hemorrhoids   . History of colonoscopy 2003   Normal, Dr. Sammuel Cooper, [place]   . Hypercholesterolemia   . Normal exercise sestamibi stress test 2008   Ken Fath  . Obstructive sleep apnea on CPAP 2007  . PONV (postoperative nausea and vomiting)   . Rotator cuff tear, left   . Sleep apnea     Review of systems:      Physical Exam    Heart and lungs: Regular rate and rhythm without rub or gallop, lungs are bilaterally clear.    HEENT: Normocephalic atraumatic eyes are anicteric    Other:     Pertinant exam for procedure: Soft nontender nondistended bowel sounds positive normoactive    Planned proceedures: Colonoscopy and indicated procedures. I have discussed the risks benefits and complications of procedures to include not limited to bleeding, infection, perforation and the risk of sedation and the patient wishes to proceed.    Lollie Sails, MD Gastroenterology 09/11/2016  2:14 PM

## 2016-09-11 NOTE — Anesthesia Post-op Follow-up Note (Signed)
Anesthesia QCDR form completed.        

## 2016-09-11 NOTE — Anesthesia Preprocedure Evaluation (Signed)
Anesthesia Evaluation  Patient identified by MRN, date of birth, ID band Patient awake    Reviewed: Allergy & Precautions, H&P , NPO status , Patient's Chart, lab work & pertinent test results  History of Anesthesia Complications (+) PONV and history of anesthetic complications  Airway Mallampati: III  TM Distance: >3 FB Neck ROM: limited    Dental  (+) Poor Dentition   Pulmonary neg shortness of breath, sleep apnea , former smoker,    Pulmonary exam normal breath sounds clear to auscultation       Cardiovascular Exercise Tolerance: Good (-) hypertension(-) angina(-) Past MI negative cardio ROS Normal cardiovascular exam Rhythm:regular Rate:Normal     Neuro/Psych  Headaches, negative neurological ROS  negative psych ROS   GI/Hepatic negative GI ROS, Neg liver ROS, neg GERD  ,  Endo/Other  negative endocrine ROS  Renal/GU negative Renal ROS  negative genitourinary   Musculoskeletal negative musculoskeletal ROS (+) Arthritis ,   Abdominal   Peds negative pediatric ROS (+)  Hematology negative hematology ROS (+) anemia ,   Anesthesia Other Findings Past Medical History:   History of colonoscopy                          2003           Comment:Normal, Dr. Sammuel Cooper, [place]    Normal exercise sestamibi stress test           2008           Comment:Ken Fath   Rotator cuff tear, left                                      Hypercholesterolemia                                         Cancer ([place])                                    15 yrs ago     Comment:[employer] on the face. Surgical removal   Hemorrhoids                                                  Obstructive sleep apnea on CPAP                 2007         Sleep apnea                                                  Arthritis                                                      Comment:left shoulder   Anemia  Comment:h/o as a child   Complication of anesthesia                                   PONV (postoperative nausea and vomiting)                    Past Surgical History:   ABDOMINALPLASTY W/ LIPOSUCTION                   AGE 71       NASAL SEPTUM SURGERY                             AGE 54       NASAL SINUS SURGERY                              AGE 89       BUNIONECTOMY                                     X3  LAST *   SHOULDER ARTHROSCOPY                             10/25/2011      Comment:Procedure: ARTHROSCOPY SHOULDER;  Surgeon:               Magnus Sinning, MD;  Location: [place];  Service: Orthopedics;                Laterality: Left;  with SAD, shave of the               [place]         COLONOSCOPY                                      2012           Comment:Dr Wyline Mood in [place] 04/19/2015       Comment:Procedure: LAPAROSCOPIC CHOLECYSTECTOMY WITH               INTRAOPERATIVE CHOLANGIOGRAM;  Surgeon: Robert Bellow, MD;  Location: [facility] ORS;  Service:               General;  Laterality: N/A;  BMI    Body Mass Index   25.12 kg/m 2    Patient is NPO appropriate and reports no nausea or vomiting today.   Reproductive/Obstetrics negative OB ROS  Anesthesia Physical  Anesthesia Plan  ASA: II  Anesthesia Plan: General   Post-op Pain Management:    Induction: Intravenous  PONV Risk Score and Plan:   Airway Management Planned: Nasal Cannula  Additional Equipment:   Intra-op Plan:   Post-operative Plan:   Informed Consent: I have reviewed the patients History and Physical, chart, labs and discussed the procedure including the risks, benefits and alternatives for the proposed anesthesia with the patient or authorized representative who has indicated  his/her understanding and acceptance.   Dental Advisory Given and Dental advisory given  Plan Discussed with: Anesthesiologist, CRNA and Surgeon  Anesthesia Plan Comments:         Anesthesia Quick Evaluation

## 2016-09-11 NOTE — Op Note (Signed)
[facility] Gastroenterology Patient Name: Dawn Wolfe Procedure Date: 09/11/2016 2:20 PM MRN: [id number] Account #: [id number] Date of Birth: 1945/08/06 Admit Type: Outpatient Age: 71 Room: [facility] ENDO ROOM 3 Gender: Female Note Status: Finalized Procedure:            Colonoscopy Indications:          Personal history of colonic polyps Providers:            Lollie Sails, MD Referring MD:         Einar Pheasant, MD (Referring MD) Medicines:            Monitored Anesthesia Care Complications:        No immediate complications. Procedure:            Pre-Anesthesia Assessment:                       - ASA Grade Assessment: III - A patient with severe                        systemic disease.                       After obtaining informed consent, the colonoscope was                        passed under direct vision. Throughout the procedure,                        the patient's blood pressure, pulse, and oxygen                        saturations were monitored continuously. The                        Colonoscope was introduced through the anus with the                        intention of advancing to the cecum. The scope was                        advanced to the sigmoid colon before the procedure was                        aborted. Medications were given. The colonoscopy was                        unusually difficult due to restricted mobility of the                        colon and a tortuous colon. The patient tolerated the                        procedure well. The quality of the bowel preparation                        was good. Findings:      A few small-mouthed diverticula were found in the sigmoid colon.      A stenosis versus very sharp angulation was found at 25 cm proximal to       the anus and was non-traversed.  I tried multiple positions an abdominal       support without success, returning to the same location each time.      Non-bleeding  internal hemorrhoids were found during retroflexion. The       hemorrhoids were small. Impression:           - Diverticulosis in the sigmoid colon.                       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                       - Non-bleeding internal hemorrhoids.                       - No specimens collected. Recommendation:       - Perform an air contrast barium enema at appointment                        to be scheduled. Procedure Code(s):    --- Professional ---                       [phone], 53, Colonoscopy, flexible; diagnostic, including                        collection of specimen(s) by brushing or washing, when                        performed (separate procedure) Diagnosis Code(s):    --- Professional ---                       K64.8, Other hemorrhoids                       K56.69, Other intestinal obstruction                       Z86.010, Personal history of colonic polyps                       K57.30, Diverticulosis of large intestine without                        perforation or abscess without bleeding CPT copyright 2016 American Medical Association. All rights reserved. The codes documented in this report are preliminary and upon coder review may  be revised to meet current compliance requirements. Lollie Sails, MD 09/11/2016 2:55:04 PM This report has been signed electronically. Number of Addenda: 0 Note Initiated On: 09/11/2016 2:20 PM Scope Withdrawal Time: 0 hours 1 minute 50 seconds  Total Procedure Duration: 0 hours 18 minutes 24 seconds       [facility]

## 2016-09-12 ENCOUNTER — Encounter: Payer: Self-pay | Admitting: Gastroenterology

## 2016-09-12 NOTE — Anesthesia Postprocedure Evaluation (Signed)
Anesthesia Post Note  Patient: Dawn Wolfe  Procedure(s) Performed: Procedure(s) (LRB): COLONOSCOPY WITH PROPOFOL (N/A)  Patient location during evaluation: PACU Anesthesia Type: General Level of consciousness: awake and alert and oriented Pain management: pain level controlled Vital Signs Assessment: post-procedure vital signs reviewed and stable Respiratory status: spontaneous breathing Cardiovascular status: blood pressure returned to baseline Anesthetic complications: no     Last Vitals:  Vitals:   09/11/16 1515 09/11/16 1525  BP: 107/72 111/75  Pulse:  83  Resp: (!) 84 (!) 83  Temp:    SpO2: 99% 100%    Last Pain:  Vitals:   09/12/16 0727  TempSrc:   PainSc: 0-No pain                 Jhonny Calixto

## 2016-10-09 ENCOUNTER — Encounter: Payer: Self-pay | Admitting: Internal Medicine

## 2016-10-09 ENCOUNTER — Ambulatory Visit (INDEPENDENT_AMBULATORY_CARE_PROVIDER_SITE_OTHER): Payer: Medicare Other | Admitting: Internal Medicine

## 2016-10-09 DIAGNOSIS — G4733 Obstructive sleep apnea (adult) (pediatric): Secondary | ICD-10-CM | POA: Diagnosis not present

## 2016-10-09 DIAGNOSIS — R739 Hyperglycemia, unspecified: Secondary | ICD-10-CM | POA: Diagnosis not present

## 2016-10-09 DIAGNOSIS — E7800 Pure hypercholesterolemia, unspecified: Secondary | ICD-10-CM

## 2016-10-09 DIAGNOSIS — R748 Abnormal levels of other serum enzymes: Secondary | ICD-10-CM | POA: Diagnosis not present

## 2016-10-09 DIAGNOSIS — R519 Headache, unspecified: Secondary | ICD-10-CM

## 2016-10-09 DIAGNOSIS — R51 Headache: Secondary | ICD-10-CM

## 2016-10-09 MED ORDER — FLUTICASONE PROPIONATE 50 MCG/ACT NA SUSP: 2.0000 | NASAL | 3 refills | Status: DC | PRN

## 2016-10-09 NOTE — Progress Notes (Signed)
Patient ID: Dawn Wolfe, female   DOB: [birth year], 71 y.o.   MRN: [id number]   Subjective:    Patient ID: Dawn Wolfe, female    DOB: 1945/11/27, 71 y.o.   MRN: [id number]  HPI  Patient here for a scheduled follow up.  She is accompanied by her husband.  She reports she is doing well. Feels better.  No headache.  No dizziness.  No chest pain.  No sob.  No acid reflux.  No abdominal pain.  Bowels moving.  Stays active.  Discussed recent colonoscopy.  Needs f/u barium enema.  Had stricture at 25cm.     Past Medical History:  Diagnosis Date  . Anemia    h/o as a child  . Arthritis    left shoulder  . 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 Surgical removal  . Complication of anesthesia   . Hemorrhoids   . History of colonoscopy 2003   Normal, Dr. Sammuel Cooper, [place]   . Hypercholesterolemia   . Normal exercise sestamibi stress test 2008   Ken Fath  . Obstructive sleep apnea on CPAP 2007  . PONV (postoperative nausea and vomiting)   . Rotator cuff tear, left   . Sleep apnea    Past Surgical History:  Procedure Laterality Date  . ABDOMINALPLASTY W/ LIPOSUCTION  AGE 3  . BUNIONECTOMY  X3  LAST ONE 2003  . CHOLECYSTECTOMY N/A 04/19/2015   Procedure: LAPAROSCOPIC CHOLECYSTECTOMY WITH INTRAOPERATIVE CHOLANGIOGRAM;  Surgeon: Robert Bellow, MD;  Location: [facility] ORS;  Service: General;  Laterality: N/A;  . COLONOSCOPY  2012   Dr Wyline Mood in [place]  . COLONOSCOPY WITH PROPOFOL N/A 09/11/2016   Procedure: COLONOSCOPY WITH PROPOFOL;  Surgeon: Lollie Sails, MD;  Location: [facility] ENDOSCOPY;  Service: Endoscopy;  Laterality: N/A;  . ERCP N/A 04/29/2015   Procedure: ENDOSCOPIC RETROGRADE CHOLANGIOPANCREATOGRAPHY (ERCP);  Surgeon: Hulen Luster, MD;  Location: [facility] ENDOSCOPY;  Service: Gastroenterology;  Laterality: N/A;  . EYE SURGERY     cataract extraction  . NASAL SEPTUM SURGERY  AGE 4  . NASAL SINUS SURGERY  AGE 69  . SHOULDER ARTHROSCOPY  10/25/2011   Procedure: ARTHROSCOPY SHOULDER;  Surgeon: Magnus Sinning, MD;  Location: [facility];  Service: Orthopedics;  Laterality: Left;  with SAD, shave of the labrium   . TUBAL LIGATION  1982   Family History  Problem Relation Age of Onset  . Acute myelogenous leukemia Sister   . Polycythemia Sister 12  . Emphysema Father        smoker  . Heart disease Father        myocardial infarction - 48  . COPD Father   . Heart attack Father   . Breast cancer Paternal Aunt   . Testicular cancer Brother   . Heart disease Mother   . Emphysema Mother   . COPD Mother   . Asthma Mother   . Heart attack Mother   . Leukemia Sister   . Colon cancer Neg Hx    Social History   Social History  . Marital status: Married    Spouse name: N/A  . Number of children: N/A  . Years of education: N/A   Social History Main Topics  . Smoking status: Former Smoker    Packs/day: 2.50    Years: 10.00    Types: Cigarettes    Quit date: 03/28/1977  . Smokeless tobacco: Never Used  . Alcohol use No  .  Drug use: No  . Sexual activity: No   Other Topics Concern  . None   Social History Narrative  . None    Outpatient Encounter Prescriptions as of 10/09/2016  Medication Sig  . aspirin 81 MG tablet Take 81 mg by mouth daily.   . bimatoprost (LUMIGAN) 0.03 % ophthalmic solution 1 drop at bedtime.  . fluticasone (FLONASE) 50 MCG/ACT nasal spray Place 2 sprays into both nostrils as needed.  . latanoprost (XALATAN) 0.005 % ophthalmic solution Place 1 drop into both eyes.  . pravastatin (PRAVACHOL) 10 MG tablet TAKE ONE TABLET EVERY DAY  . [DISCONTINUED] fluticasone (FLONASE) 50 MCG/ACT nasal spray Place 2 sprays into both nostrils daily. (Patient taking differently: Place 2 sprays into both nostrils as needed. )  . Multiple Vitamin (MULTIVITAMIN) tablet Take 1 tablet by mouth daily.   . Omega-3 Fatty Acids (OMEGA-3 FISH OIL PO) Take by mouth.   No facility-administered encounter medications  on file as of 10/09/2016.     Review of Systems  Constitutional: Negative for appetite change and unexpected weight change.  HENT: Negative for congestion and sinus pressure.   Respiratory: Negative for cough, chest tightness and shortness of breath.   Cardiovascular: Negative for chest pain, palpitations and leg swelling.  Gastrointestinal: Negative for abdominal pain, diarrhea, nausea and vomiting.  Genitourinary: Negative for difficulty urinating and dysuria.  Musculoskeletal: Negative for back pain and joint swelling.  Skin: Negative for color change and rash.  Neurological: Negative for dizziness, light-headedness and headaches.  Psychiatric/Behavioral: Negative for agitation and dysphoric mood.       Objective:    Physical Exam  Constitutional: She appears well-developed and well-nourished. No distress.  HENT:  Nose: Nose normal.  Mouth/Throat: Oropharynx is clear and moist.  Neck: Neck supple. No thyromegaly present.  Cardiovascular: Normal rate and regular rhythm.   Pulmonary/Chest: Breath sounds normal. No respiratory distress. She has no wheezes.  Abdominal: Soft. Bowel sounds are normal. There is no tenderness.  Musculoskeletal: She exhibits no edema or tenderness.  Lymphadenopathy:    She has no cervical adenopathy.  Skin: No rash noted. No erythema.  Psychiatric: She has a normal mood and affect. Her behavior is normal.    BP 126/82 (BP Location: Left Arm, Patient Position: Sitting, Cuff Size: Normal)   Pulse 82   Temp 98.6 F (37 C) (Oral)   Resp 14   Ht 5' 6"  (1.676 m)   Wt 157 lb 9.6 oz (71.5 kg)   SpO2 95%   BMI 25.44 kg/m  Wt Readings from Last 3 Encounters:  10/09/16 157 lb 9.6 oz (71.5 kg)  09/11/16 152 lb (68.9 kg)  08/10/16 156 lb 6.4 oz (70.9 kg)     Lab Results  Component Value Date   WBC 5.3 08/03/2016   HGB 13.8 08/03/2016   HCT 40.5 08/03/2016   PLT 167.0 08/03/2016   GLUCOSE 121 (H) 08/03/2016   CHOL 156 08/03/2016   TRIG 160.0  (H) 08/03/2016   HDL 44.00 08/03/2016   LDLDIRECT 88.0 07/22/2014   LDLCALC 80 08/03/2016   ALT 27 08/03/2016   AST 26 08/03/2016   NA 139 08/03/2016   K 4.6 08/03/2016   CL 104 08/03/2016   CREATININE 0.95 08/03/2016   BUN 17 08/03/2016   CO2 30 08/03/2016   TSH 2.23 08/03/2016   HGBA1C 5.9 08/03/2016    Mm Screening Breast Tomo Bilateral  Result Date: 07/24/2016 CLINICAL DATA:  Screening. EXAM: 2D DIGITAL SCREENING BILATERAL MAMMOGRAM WITH CAD AND  ADJUNCT TOMO COMPARISON:  Previous exam(s). ACR Breast Density Category c: The breast tissue is heterogeneously dense, which may obscure small masses. FINDINGS: There are no findings suspicious for malignancy. Images were processed with CAD. IMPRESSION: No mammographic evidence of malignancy. A result letter of this screening mammogram will be mailed directly to the patient. RECOMMENDATION: Screening mammogram in one year. (Code:SM-B-01Y) BI-RADS CATEGORY  1: Negative. Electronically Signed   By: Fidela Salisbury M.D.   On: 07/24/2016 14:23       Assessment & Plan:   Problem List Items Addressed This Visit    Abnormal liver enzymes    Previous abdominal ultrasound revealed no acute abnormality.  Follow liver function tests.        Headache    Resolved.  Follow.        Hypercholesteremia    On pravastatin.  Low cholesterol diet and exercise.  Follow lipid panel and liver function tests.        Relevant Orders   Hepatic function panel   Lipid panel   Hyperglycemia    Low carb diet and exercise.  Follow met b and a1c.        Relevant Orders   Hemoglobin W1P   Basic metabolic panel   Obstructive sleep apnea    Continue with CPAP.           Einar Pheasant, MD

## 2016-10-12 ENCOUNTER — Encounter: Payer: Self-pay | Admitting: Internal Medicine

## 2016-10-12 NOTE — Assessment & Plan Note (Signed)
Resolved. Follow °

## 2016-10-12 NOTE — Assessment & Plan Note (Signed)
On pravastatin.  Low cholesterol diet and exercise.  Follow lipid panel and liver function tests.   

## 2016-10-12 NOTE — Assessment & Plan Note (Signed)
Continue with CPAP

## 2016-10-12 NOTE — Assessment & Plan Note (Signed)
Low carb diet and exercise.  Follow met b and a1c.   

## 2016-10-12 NOTE — Assessment & Plan Note (Signed)
Previous abdominal ultrasound revealed no acute abnormality.  Follow liver function tests.

## 2016-10-13 DIAGNOSIS — G4733 Obstructive sleep apnea (adult) (pediatric): Secondary | ICD-10-CM | POA: Diagnosis not present

## 2016-10-18 DIAGNOSIS — Z23 Encounter for immunization: Secondary | ICD-10-CM | POA: Diagnosis not present

## 2016-11-03 ENCOUNTER — Other Ambulatory Visit: Payer: Self-pay | Admitting: Gastroenterology

## 2016-11-03 DIAGNOSIS — Z86010 Personal history of colonic polyps: Secondary | ICD-10-CM | POA: Diagnosis not present

## 2016-11-08 ENCOUNTER — Ambulatory Visit
Admission: RE | Admit: 2016-11-08 | Discharge: 2016-11-08 | Disposition: A | Discharge status: Home / Self Care | Payer: Medicare Other | Source: Ambulatory Visit | Attending: Gastroenterology | Admitting: Gastroenterology

## 2016-11-08 DIAGNOSIS — K5730 Diverticulosis of large intestine without perforation or abscess without bleeding: Secondary | ICD-10-CM | POA: Diagnosis not present

## 2016-11-08 DIAGNOSIS — Z86010 Personal history of colonic polyps: Secondary | ICD-10-CM | POA: Insufficient documentation

## 2016-11-10 DIAGNOSIS — D2261 Melanocytic nevi of right upper limb, including shoulder: Secondary | ICD-10-CM | POA: Diagnosis not present

## 2016-11-10 DIAGNOSIS — D2272 Melanocytic nevi of left lower limb, including hip: Secondary | ICD-10-CM | POA: Diagnosis not present

## 2016-11-10 DIAGNOSIS — D225 Melanocytic nevi of trunk: Secondary | ICD-10-CM | POA: Diagnosis not present

## 2016-11-10 DIAGNOSIS — Z85828 Personal history of other malignant neoplasm of skin: Secondary | ICD-10-CM | POA: Diagnosis not present

## 2016-11-30 DIAGNOSIS — H40153 Residual stage of open-angle glaucoma, bilateral: Secondary | ICD-10-CM | POA: Diagnosis not present

## 2016-12-11 ENCOUNTER — Other Ambulatory Visit: Payer: Self-pay | Admitting: Internal Medicine

## 2017-01-16 DIAGNOSIS — G459 Transient cerebral ischemic attack, unspecified: Secondary | ICD-10-CM

## 2017-01-16 HISTORY — DX: Transient cerebral ischemic attack, unspecified: G45.9

## 2017-02-14 ENCOUNTER — Other Ambulatory Visit (INDEPENDENT_AMBULATORY_CARE_PROVIDER_SITE_OTHER): Payer: Medicare Other

## 2017-02-14 DIAGNOSIS — E7800 Pure hypercholesterolemia, unspecified: Secondary | ICD-10-CM

## 2017-02-14 DIAGNOSIS — R739 Hyperglycemia, unspecified: Secondary | ICD-10-CM | POA: Diagnosis not present

## 2017-02-14 LAB — BASIC METABOLIC PANEL
BUN: 20 mg/dL (ref 6–23)
CALCIUM: 9.7 mg/dL (ref 8.4–10.5)
CHLORIDE: 104 meq/L (ref 96–112)
CO2: 29 meq/L (ref 19–32)
Creatinine, Ser: 0.87 mg/dL (ref 0.40–1.20)
GFR: 68.34 mL/min (ref >60.00)
GLUCOSE: 118 mg/dL — AB (ref 70–99)
Potassium: 4.4 mEq/L (ref 3.5–5.1)
Sodium: 140 mEq/L (ref 135–145)

## 2017-02-14 LAB — HEPATIC FUNCTION PANEL
ALT: 23 U/L (ref 0–35)
AST: 27 U/L (ref 0–37)
Albumin: 4.5 g/dL (ref 3.5–5.2)
Alkaline Phosphatase: 60 U/L (ref 39–117)
BILIRUBIN TOTAL: 0.7 mg/dL (ref 0.2–1.2)
Bilirubin, Direct: 0.1 mg/dL (ref 0.0–0.3)
Total Protein: 7.8 g/dL (ref 6.0–8.3)

## 2017-02-14 LAB — LIPID PANEL
CHOL/HDL RATIO: 5
CHOLESTEROL: 160 mg/dL (ref 0–200)
HDL: 35 mg/dL — ABNORMAL LOW (ref >39.00)
LDL CALC: 86 mg/dL (ref 0–99)
NONHDL: 124.92
Triglycerides: 193 mg/dL — ABNORMAL HIGH (ref 0.0–149.0)
VLDL: 38.6 mg/dL (ref 0.0–40.0)

## 2017-02-14 LAB — HEMOGLOBIN A1C: Hgb A1c MFr Bld: 5.9 % (ref 4.6–6.5)

## 2017-02-15 ENCOUNTER — Encounter: Payer: Self-pay | Admitting: Internal Medicine

## 2017-02-15 ENCOUNTER — Ambulatory Visit: Payer: Medicare Other | Admitting: Internal Medicine

## 2017-02-15 DIAGNOSIS — E162 Hypoglycemia, unspecified: Secondary | ICD-10-CM | POA: Diagnosis not present

## 2017-02-15 DIAGNOSIS — R739 Hyperglycemia, unspecified: Secondary | ICD-10-CM | POA: Diagnosis not present

## 2017-02-15 DIAGNOSIS — G4733 Obstructive sleep apnea (adult) (pediatric): Secondary | ICD-10-CM | POA: Diagnosis not present

## 2017-02-15 DIAGNOSIS — R748 Abnormal levels of other serum enzymes: Secondary | ICD-10-CM | POA: Diagnosis not present

## 2017-02-15 DIAGNOSIS — E7800 Pure hypercholesterolemia, unspecified: Secondary | ICD-10-CM | POA: Diagnosis not present

## 2017-02-15 DIAGNOSIS — Z8371 Family history of colonic polyps: Secondary | ICD-10-CM

## 2017-02-15 MED ORDER — PRAVASTATIN SODIUM 10 MG PO TABS: 10.0000 mg | ORAL_TABLET | Freq: Every day | ORAL | 1 refills | Status: DC

## 2017-02-15 NOTE — Progress Notes (Signed)
Patient ID: Dawn Wolfe, female   DOB: [birth year], 72 y.o.   MRN: [id number]   Subjective:    Patient ID: Dawn Wolfe, female    DOB: 09-17-1945, 72 y.o.   MRN: [id number]  HPI  Patient here for a scheduled follow up.  She has been watching her diet.  Starting to exercise more.  No chest pain. No sob.  No acid reflux.  No abdominal pain.  Bowels moving.  States since cutting out a lot of sugar in her diet, she has noticed on a couple of occasions feeling as if her sugar is dropping.  Does not occur often.  Occurs a few hours after eating a small breakfast.  Discussed adding protein with breakfast or eating a protein snack mid morning. Discussed not going long periods without eating.  Overall feels good.     Past Medical History:  Diagnosis Date  . Anemia    h/o as a child  . Arthritis    left shoulder  . 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 Surgical removal  . Complication of anesthesia   . Hemorrhoids   . History of colonoscopy 2003   Normal, Dr. Sammuel Cooper, [place]   . Hypercholesterolemia   . Normal exercise sestamibi stress test 2008   Ken Fath  . Obstructive sleep apnea on CPAP 2007  . PONV (postoperative nausea and vomiting)   . Rotator cuff tear, left   . Sleep apnea    Past Surgical History:  Procedure Laterality Date  . ABDOMINALPLASTY W/ LIPOSUCTION  AGE 39  . BUNIONECTOMY  X3  LAST ONE 2003  . CHOLECYSTECTOMY N/A 04/19/2015   Procedure: LAPAROSCOPIC CHOLECYSTECTOMY WITH INTRAOPERATIVE CHOLANGIOGRAM;  Surgeon: Robert Bellow, MD;  Location: [facility] ORS;  Service: General;  Laterality: N/A;  . COLONOSCOPY  2012   Dr Wyline Mood in [place]  . COLONOSCOPY WITH PROPOFOL N/A 09/11/2016   Procedure: COLONOSCOPY WITH PROPOFOL;  Surgeon: Lollie Sails, MD;  Location: [facility] ENDOSCOPY;  Service: Endoscopy;  Laterality: N/A;  . ERCP N/A 04/29/2015   Procedure: ENDOSCOPIC RETROGRADE CHOLANGIOPANCREATOGRAPHY (ERCP);  Surgeon: Hulen Luster, MD;  Location: [facility]  ENDOSCOPY;  Service: Gastroenterology;  Laterality: N/A;  . EYE SURGERY     cataract extraction  . NASAL SEPTUM SURGERY  AGE 89  . NASAL SINUS SURGERY  AGE 27  . SHOULDER ARTHROSCOPY  10/25/2011   Procedure: ARTHROSCOPY SHOULDER;  Surgeon: Magnus Sinning, MD;  Location: [facility];  Service: Orthopedics;  Laterality: Left;  with SAD, shave of the labrium   . TUBAL LIGATION  1982   Family History  Problem Relation Age of Onset  . Acute myelogenous leukemia Sister   . Polycythemia Sister 24  . Emphysema Father        smoker  . Heart disease Father        myocardial infarction - 44  . COPD Father   . Heart attack Father   . Breast cancer Paternal Aunt   . Testicular cancer Brother   . Heart disease Mother   . Emphysema Mother   . COPD Mother   . Asthma Mother   . Heart attack Mother   . Leukemia Sister   . Colon cancer Neg Hx    Social History   Socioeconomic History  . Marital status: Married    Spouse name: None  . Number of children: None  . Years of education: None  . Highest education level: None  Social Needs  . Financial resource strain: None  . Food insecurity - worry: None  . Food insecurity - inability: None  . Transportation needs - medical: None  . Transportation needs - non-medical: None  Occupational History  . None  Tobacco Use  . Smoking status: Former Smoker    Packs/day: 2.50    Years: 10.00    Pack years: 25.00    Types: Cigarettes    Last attempt to quit: 03/28/1977    Years since quitting: 39.9  . Smokeless tobacco: Never Used  Substance and Sexual Activity  . Alcohol use: No    Alcohol/week: 0.0 oz  . Drug use: No  . Sexual activity: No  Other Topics Concern  . None  Social History Narrative  . None    Outpatient Encounter Medications as of 02/15/2017  Medication Sig  . aspirin 81 MG tablet Take 81 mg by mouth daily.   . fluticasone (FLONASE) 50 MCG/ACT nasal spray Place 2 sprays into both nostrils as needed.  .  latanoprost (XALATAN) 0.005 % ophthalmic solution Place 1 drop into both eyes.  . pravastatin (PRAVACHOL) 10 MG tablet Take 1 tablet (10 mg total) by mouth daily.  . [DISCONTINUED] pravastatin (PRAVACHOL) 10 MG tablet Take 1 tablet (10 mg total) by mouth daily.  . bimatoprost (LUMIGAN) 0.03 % ophthalmic solution 1 drop at bedtime.  . Multiple Vitamin (MULTIVITAMIN) tablet Take 1 tablet by mouth daily.   . Omega-3 Fatty Acids (OMEGA-3 FISH OIL PO) Take by mouth.   No facility-administered encounter medications on file as of 02/15/2017.     Review of Systems  Constitutional: Negative for appetite change and unexpected weight change.  HENT: Negative for congestion and sinus pressure.   Respiratory: Negative for cough, chest tightness and shortness of breath.   Cardiovascular: Negative for chest pain, palpitations and leg swelling.  Gastrointestinal: Negative for abdominal pain, diarrhea, nausea and vomiting.  Genitourinary: Negative for difficulty urinating and dysuria.  Musculoskeletal: Negative for joint swelling and myalgias.  Skin: Negative for color change and rash.  Neurological: Negative for dizziness, light-headedness and headaches.  Psychiatric/Behavioral: Negative for agitation and dysphoric mood.       Objective:     Blood pressure rechecked by me:  130/78  Physical Exam  Constitutional: She appears well-developed and well-nourished. No distress.  HENT:  Nose: Nose normal.  Mouth/Throat: Oropharynx is clear and moist.  Neck: Neck supple. No thyromegaly present.  Cardiovascular: Normal rate and regular rhythm.  Pulmonary/Chest: Breath sounds normal. No respiratory distress. She has no wheezes.  Abdominal: Soft. Bowel sounds are normal. There is no tenderness.  Musculoskeletal: She exhibits no edema or tenderness.  Lymphadenopathy:    She has no cervical adenopathy.  Skin: No rash noted. No erythema.  Psychiatric: She has a normal mood and affect. Her behavior is normal.     BP 128/74 (BP Location: Left Arm, Patient Position: Sitting, Cuff Size: Normal)   Pulse 88   Temp 98.2 F (36.8 C) (Oral)   Resp 18   Wt 156 lb 12.8 oz (71.1 kg)   SpO2 97%   BMI 25.31 kg/m  Wt Readings from Last 3 Encounters:  02/15/17 156 lb 12.8 oz (71.1 kg)  10/09/16 157 lb 9.6 oz (71.5 kg)  09/11/16 152 lb (68.9 kg)     Lab Results  Component Value Date   WBC 5.3 08/03/2016   HGB 13.8 08/03/2016   HCT 40.5 08/03/2016   PLT 167.0 08/03/2016   GLUCOSE 118 (H)  02/14/2017   CHOL 160 02/14/2017   TRIG 193.0 (H) 02/14/2017   HDL 35.00 (L) 02/14/2017   LDLDIRECT 88.0 07/22/2014   LDLCALC 86 02/14/2017   ALT 23 02/14/2017   AST 27 02/14/2017   NA 140 02/14/2017   K 4.4 02/14/2017   CL 104 02/14/2017   CREATININE 0.87 02/14/2017   BUN 20 02/14/2017   CO2 29 02/14/2017   TSH 2.23 08/03/2016   HGBA1C 5.9 02/14/2017    Dg Colon Alonza Bogus Hi Den Cm  Result Date: 11/08/2016 CLINICAL DATA:  History of polyps with previous incomplete colonoscopy EXAM: AIR CONTRAST BARIUM ENEMA TECHNIQUE: Initial scout AP supine abdominal image obtained to insure adequate colon cleansing. Barium was introduced into the colon in a retrograde fashion and refluxed from the rectum to the distal transverse colon. As much of the barium as possible was then removed through the indwelling tube via gravity drain. Air was then insufflated into the colon. Spot images of the colon followed by overhead radiographs were obtained. FLUOROSCOPY TIME:  Fluoroscopy Time:  3 minutes 30 seconds Radiation Exposure Index (if provided by the fluoroscopic device): 317.50 mGy Number of Acquired Spot Images: 11 COMPARISON:  None. FINDINGS: Preliminary abdomen radiograph shows a normal gas pattern. The entire colon is visualized with reflux into the terminal ileum using air contrast technique. There are scattered diverticula throughout the colon without diverticulitis. There is no appreciable polyp or mass lesion demonstrable.  No constricting lesion. No constricting lesion. No fistula or ulceration evident. No mucosal lesions are appreciable. Terminal ileum appears normal. Appendix fills to a bulbous tip. IMPRESSION: Scattered colonic diverticula without diverticulitis. No polyps or mass lesions evident. No constricting lesion, fistula, or ulceration. Terminal ileum appears normal. Appendix fills to a bulbous tip. Electronically Signed   By: Lowella Grip III M.D.   On: 11/08/2016 11:11       Assessment & Plan:   Problem List Items Addressed This Visit    Abnormal liver enzymes    Previous abdominal ultrasound revealed no acute abnormality.  Recent liver panel wnl.  Follow.       Family history of colonic polyps    Had colonoscopy 09/11/16 sharp turn.  Subsequent ACBE.        Hypercholesteremia    On pravastatin.  Low cholesterol diet and exercise.  Follow lipid panel and liver function tests.        Relevant Medications   pravastatin (PRAVACHOL) 10 MG tablet   Other Relevant Orders   Hepatic function panel   Lipid panel   Hyperglycemia    Follow met b and a1c.        Relevant Orders   Hemoglobin A1c   TSH   CBC with Differential/Platelet   Basic metabolic panel   Hypoglycemia    Reports some episodes of presumed low sugar as outlined.  Discussed adding more protein/protein snacks.  Discussed not going long periods without eating.  Follow.        Obstructive sleep apnea    Continue with CPAP.           Einar Pheasant, MD

## 2017-02-18 ENCOUNTER — Encounter: Payer: Self-pay | Admitting: Internal Medicine

## 2017-02-18 DIAGNOSIS — E162 Hypoglycemia, unspecified: Secondary | ICD-10-CM | POA: Insufficient documentation

## 2017-02-18 NOTE — Assessment & Plan Note (Signed)
Had colonoscopy 09/11/16 - sharp turn.  Subsequent ACBE.   

## 2017-02-18 NOTE — Assessment & Plan Note (Signed)
On pravastatin.  Low cholesterol diet and exercise.  Follow lipid panel and liver function tests.   

## 2017-02-18 NOTE — Assessment & Plan Note (Signed)
Reports some episodes of presumed low sugar as outlined.  Discussed adding more protein/protein snacks.  Discussed not going long periods without eating.  Follow.

## 2017-02-18 NOTE — Assessment & Plan Note (Signed)
Previous abdominal ultrasound revealed no acute abnormality.  Recent liver panel wnl.  Follow.   

## 2017-02-18 NOTE — Assessment & Plan Note (Signed)
Continue with CPAP

## 2017-02-18 NOTE — Assessment & Plan Note (Addendum)
Follow met b and a1c.

## 2017-03-12 IMAGING — CR DG CHOLANGIOGRAM OPERATIVE
3 series · 14 of 24 positions shown · non-contrast
Comparison: ULTRASOUND 01/28/2014

CLINICAL DATA: 69-YEAR-OLD FEMALE WITH A HISTORY OF CHOLELITHIASIS

EXAM:
INTRAOPERATIVE CHOLANGIOGRAM
TECHNIQUE: Cholangiographic images from the C-arm fluoroscopic device were
submitted for interpretation post-operatively. Please see the
procedural report for the amount of contrast and the fluoroscopy
time utilized.

[Series 5: cont. · 6 of 35 frames shown (1 of 2)]
[frame 1/35]
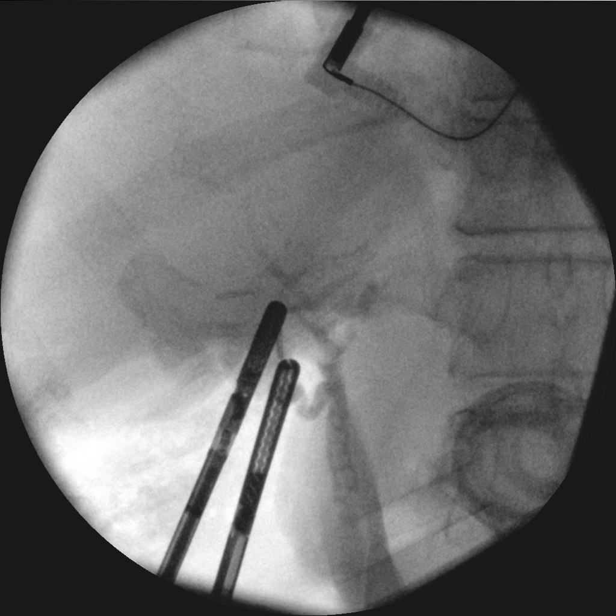
[frame 8/35]
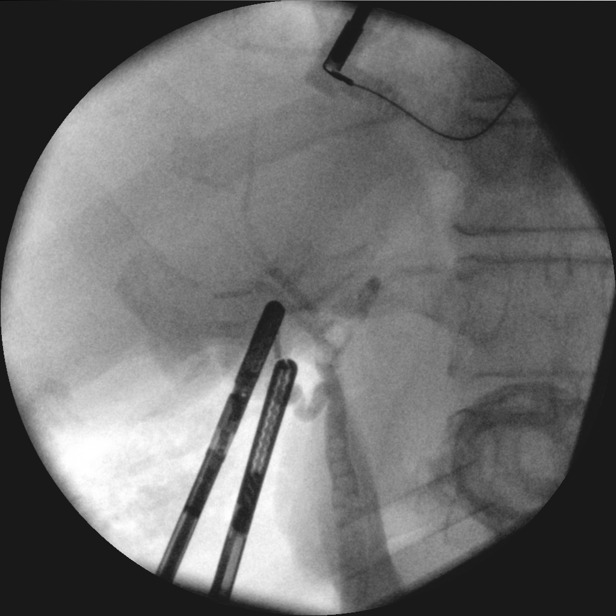
[frame 16/35]
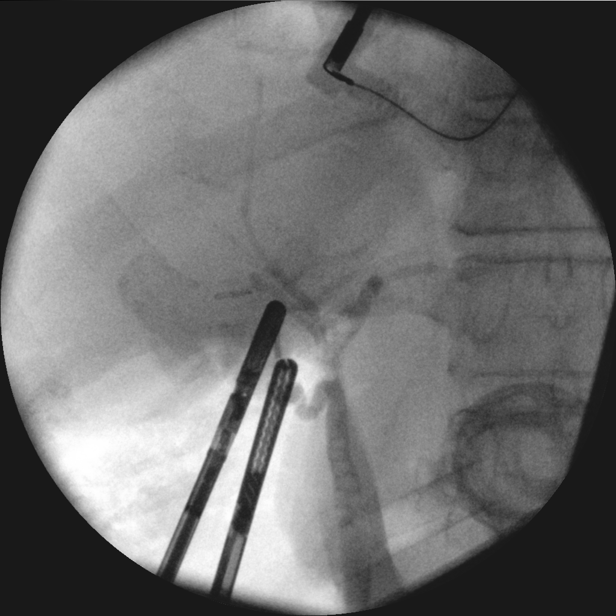
[frame 23/35]
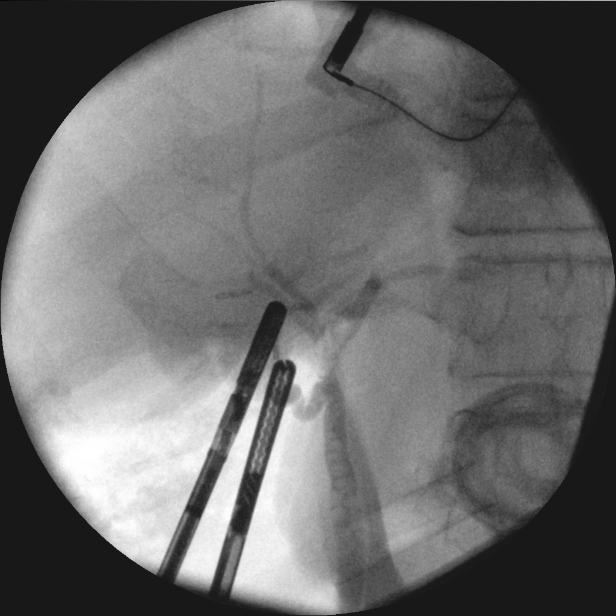
[frame 27/35]
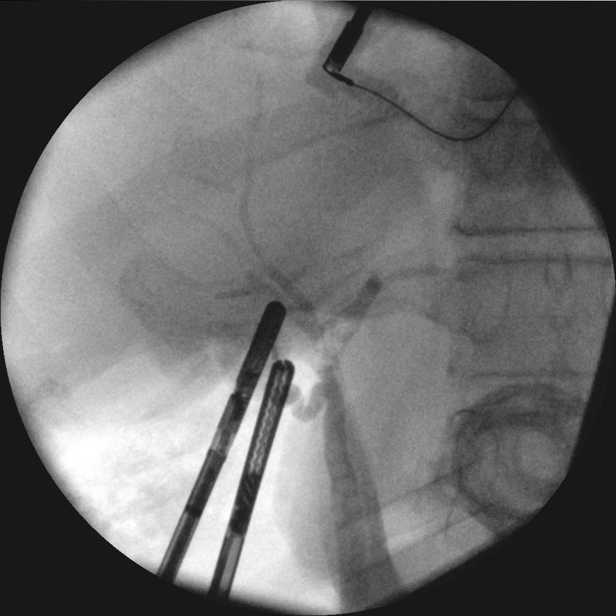
[frame 35/35]
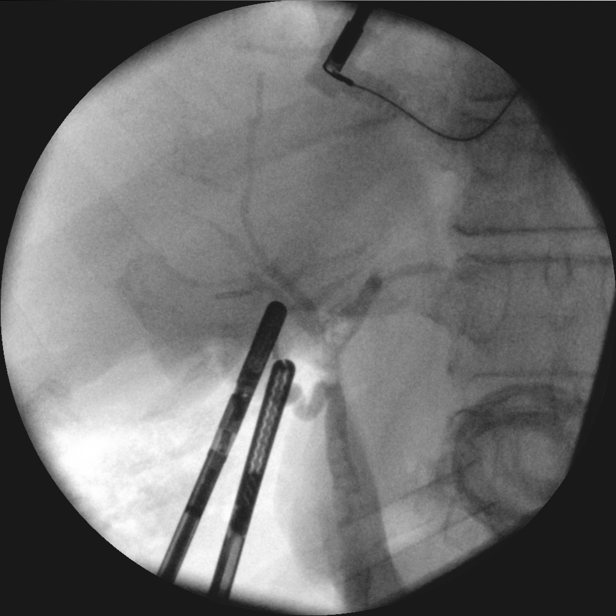

[Series 6: cont. · 6 of 51 frames shown (2 of 2)]
[frame 6/51]
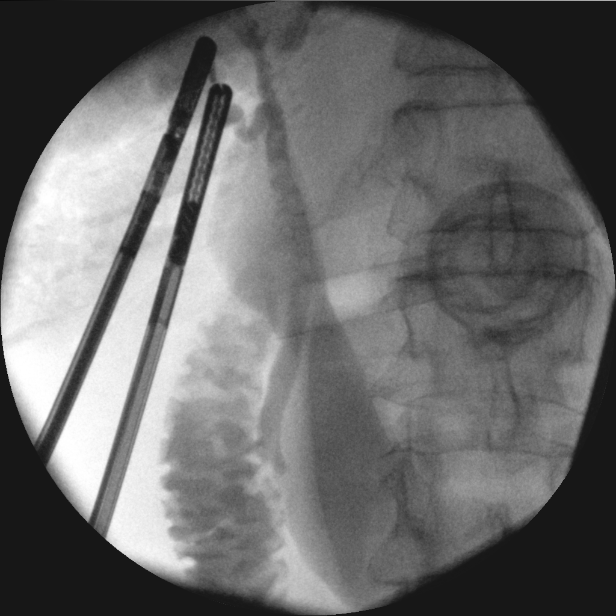
[frame 12/51]
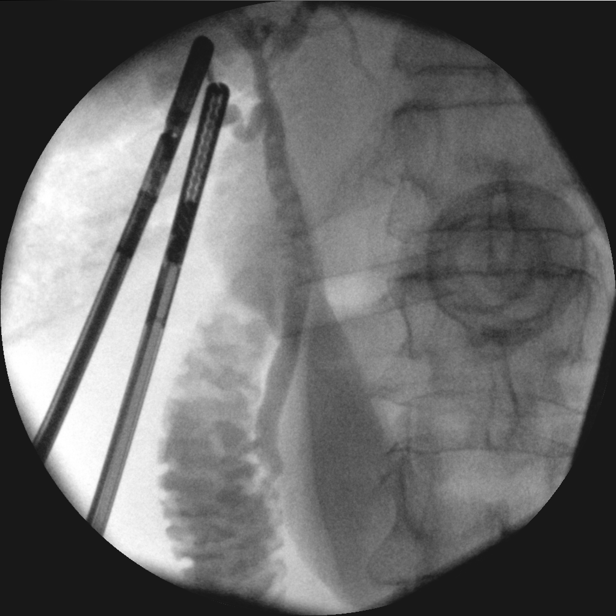
[frame 23/51]
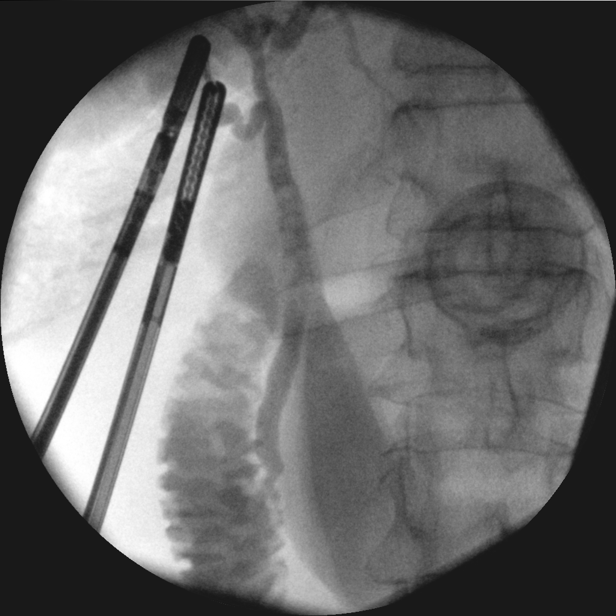
[frame 34/51]
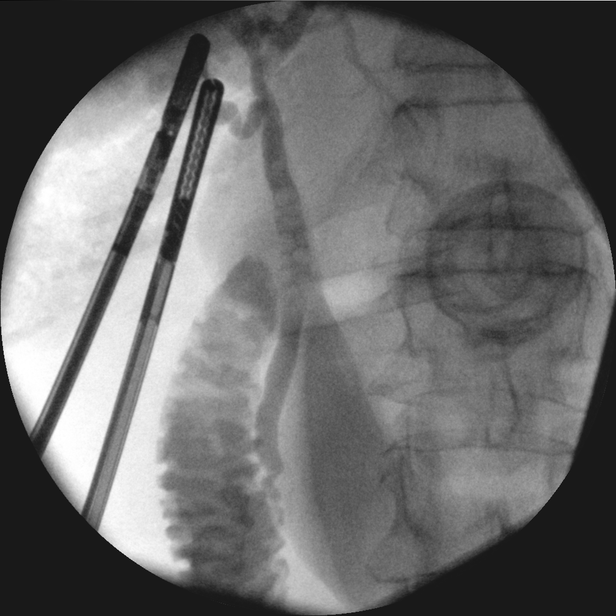
[frame 45/51]
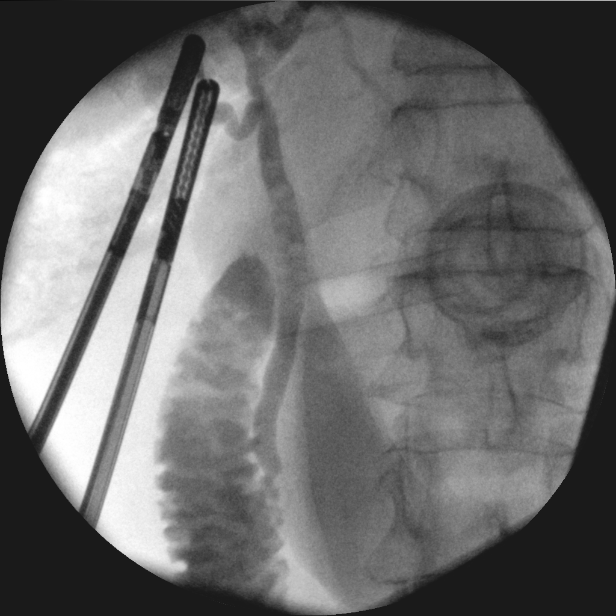
[frame 51/51]
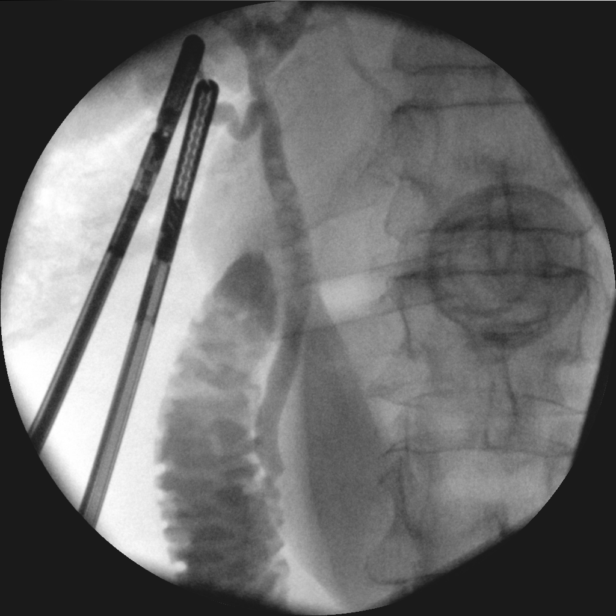

[Series 6001: 1 · 2 of 4 slices shown]
[im 2/4]
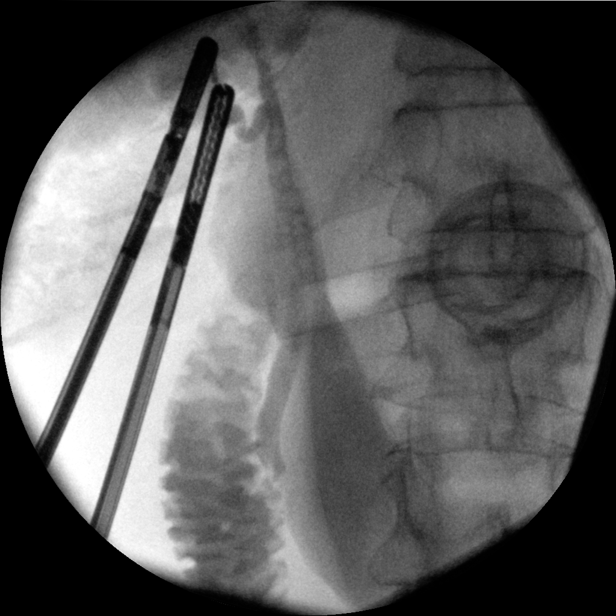
[im 4/4]
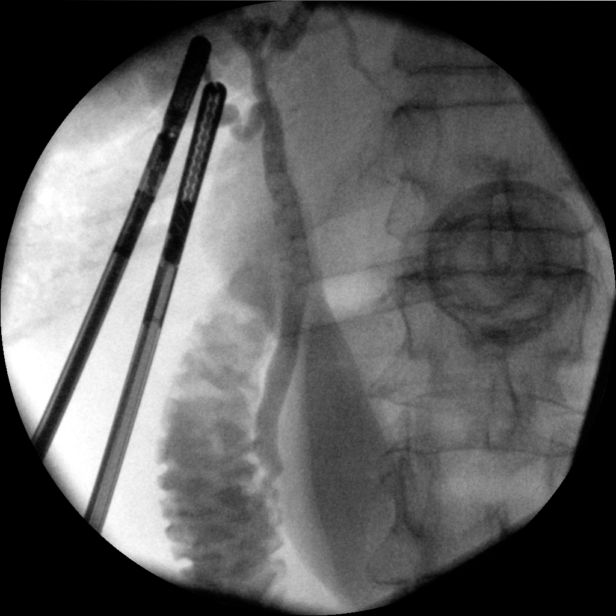

[14 of 24 positions shown; findings below may reference images not displayed]

FINDINGS: Surgical instruments project over the upper abdomen.

There is cannulation of the cystic duct/gallbladder neck, with
antegrade infusion of contrast. Caliber of the extrahepatic ductal
system within normal limits.

Multiple rounded filling defects present on the exam involving
common hepatic duct, common bile duct. No sectoral duct involvement.
No visualized filling defects of the cystic duct.

Free flow of contrast across the ampulla.
IMPRESSION: Intraoperative cholangiogram demonstrates extrahepatic biliary ducts
of unremarkable caliber, with multiple small filling defects of the
common hepatic duct and common bile duct, compatible with small
stones and/or air bubbles. If stones/debris, these are
nonobstructive and contrast traverses the ampulla.

Please refer to the dictated operative report for full details of
intraoperative findings and procedure

## 2017-03-29 DIAGNOSIS — H40153 Residual stage of open-angle glaucoma, bilateral: Secondary | ICD-10-CM | POA: Diagnosis not present

## 2017-03-29 DIAGNOSIS — H524 Presbyopia: Secondary | ICD-10-CM | POA: Diagnosis not present

## 2017-05-16 DIAGNOSIS — G4733 Obstructive sleep apnea (adult) (pediatric): Secondary | ICD-10-CM | POA: Diagnosis not present

## 2017-06-12 ENCOUNTER — Other Ambulatory Visit: Payer: Self-pay | Admitting: Internal Medicine

## 2017-06-12 DIAGNOSIS — Z1231 Encounter for screening mammogram for malignant neoplasm of breast: Secondary | ICD-10-CM

## 2017-07-25 ENCOUNTER — Ambulatory Visit
Admission: RE | Admit: 2017-07-25 | Discharge: 2017-07-25 | Disposition: A | Discharge status: Home / Self Care | Payer: Medicare Other | Source: Ambulatory Visit | Attending: Internal Medicine | Admitting: Internal Medicine

## 2017-07-25 ENCOUNTER — Other Ambulatory Visit: Payer: Self-pay | Admitting: Internal Medicine

## 2017-07-25 DIAGNOSIS — R928 Other abnormal and inconclusive findings on diagnostic imaging of breast: Secondary | ICD-10-CM

## 2017-07-25 DIAGNOSIS — R921 Mammographic calcification found on diagnostic imaging of breast: Secondary | ICD-10-CM

## 2017-07-25 DIAGNOSIS — Z1231 Encounter for screening mammogram for malignant neoplasm of breast: Secondary | ICD-10-CM | POA: Diagnosis not present

## 2017-07-31 DIAGNOSIS — H40153 Residual stage of open-angle glaucoma, bilateral: Secondary | ICD-10-CM | POA: Diagnosis not present

## 2017-08-02 ENCOUNTER — Ambulatory Visit
Admission: RE | Admit: 2017-08-02 | Discharge: 2017-08-02 | Disposition: A | Discharge status: Home / Self Care | Payer: Medicare Other | Source: Ambulatory Visit | Attending: Internal Medicine | Admitting: Internal Medicine

## 2017-08-02 DIAGNOSIS — R928 Other abnormal and inconclusive findings on diagnostic imaging of breast: Secondary | ICD-10-CM | POA: Diagnosis present

## 2017-08-02 DIAGNOSIS — R921 Mammographic calcification found on diagnostic imaging of breast: Secondary | ICD-10-CM

## 2017-08-15 ENCOUNTER — Other Ambulatory Visit (INDEPENDENT_AMBULATORY_CARE_PROVIDER_SITE_OTHER): Payer: Medicare Other

## 2017-08-15 DIAGNOSIS — E7800 Pure hypercholesterolemia, unspecified: Secondary | ICD-10-CM

## 2017-08-15 DIAGNOSIS — R739 Hyperglycemia, unspecified: Secondary | ICD-10-CM | POA: Diagnosis not present

## 2017-08-15 LAB — LIPID PANEL
CHOL/HDL RATIO: 4
Cholesterol: 158 mg/dL (ref 0–200)
HDL: 40.1 mg/dL (ref >39.00)
LDL CALC: 81 mg/dL (ref 0–99)
NonHDL: 118.05
Triglycerides: 184 mg/dL — ABNORMAL HIGH (ref 0.0–149.0)
VLDL: 36.8 mg/dL (ref 0.0–40.0)

## 2017-08-15 LAB — CBC WITH DIFFERENTIAL/PLATELET
BASOS PCT: 0.7 % (ref 0.0–3.0)
Basophils Absolute: 0 10*3/uL (ref 0.0–0.1)
Eosinophils Absolute: 0.3 10*3/uL (ref 0.0–0.7)
Eosinophils Relative: 5.4 % — ABNORMAL HIGH (ref 0.0–5.0)
HEMATOCRIT: 34.2 % — AB (ref 36.0–46.0)
Hemoglobin: 12 g/dL (ref 12.0–15.0)
LYMPHS ABS: 1.9 10*3/uL (ref 0.7–4.0)
LYMPHS PCT: 41.2 % (ref 12.0–46.0)
MCHC: 35.2 g/dL (ref 30.0–36.0)
MCV: 86.8 fl (ref 78.0–100.0)
MONOS PCT: 6.7 % (ref 3.0–12.0)
Monocytes Absolute: 0.3 10*3/uL (ref 0.1–1.0)
NEUTROS ABS: 2.1 10*3/uL (ref 1.4–7.7)
NEUTROS PCT: 46 % (ref 43.0–77.0)
PLATELETS: 161 10*3/uL (ref 150.0–400.0)
RBC: 3.94 Mil/uL (ref 3.87–5.11)
RDW: 13.3 % (ref 11.5–15.5)
WBC: 4.6 10*3/uL (ref 4.0–10.5)

## 2017-08-15 LAB — HEPATIC FUNCTION PANEL
ALK PHOS: 49 U/L (ref 39–117)
ALT: 19 U/L (ref 0–35)
AST: 20 U/L (ref 0–37)
Albumin: 4.3 g/dL (ref 3.5–5.2)
BILIRUBIN DIRECT: 0.1 mg/dL (ref 0.0–0.3)
Total Bilirubin: 0.5 mg/dL (ref 0.2–1.2)
Total Protein: 7.1 g/dL (ref 6.0–8.3)

## 2017-08-15 LAB — BASIC METABOLIC PANEL
BUN: 18 mg/dL (ref 6–23)
CALCIUM: 9.6 mg/dL (ref 8.4–10.5)
CHLORIDE: 105 meq/L (ref 96–112)
CO2: 29 meq/L (ref 19–32)
CREATININE: 0.88 mg/dL (ref 0.40–1.20)
GFR: 67.35 mL/min (ref >60.00)
GLUCOSE: 118 mg/dL — AB (ref 70–99)
POTASSIUM: 4.6 meq/L (ref 3.5–5.1)
SODIUM: 140 meq/L (ref 135–145)

## 2017-08-15 LAB — HEMOGLOBIN A1C: Hgb A1c MFr Bld: 5.9 % (ref 4.6–6.5)

## 2017-08-15 LAB — TSH: TSH: 2.61 u[IU]/mL (ref 0.35–4.50)

## 2017-08-16 ENCOUNTER — Ambulatory Visit: Payer: Medicare Other | Admitting: Internal Medicine

## 2017-08-16 ENCOUNTER — Encounter: Payer: Self-pay | Admitting: Internal Medicine

## 2017-08-16 VITALS — BP 112/70 | HR 85 | Temp 98.1°F | Resp 18 | Wt 154.6 lb

## 2017-08-16 DIAGNOSIS — R739 Hyperglycemia, unspecified: Secondary | ICD-10-CM | POA: Diagnosis not present

## 2017-08-16 DIAGNOSIS — E7800 Pure hypercholesterolemia, unspecified: Secondary | ICD-10-CM | POA: Diagnosis not present

## 2017-08-16 DIAGNOSIS — G4733 Obstructive sleep apnea (adult) (pediatric): Secondary | ICD-10-CM

## 2017-08-16 DIAGNOSIS — R5383 Other fatigue: Secondary | ICD-10-CM

## 2017-08-16 DIAGNOSIS — R748 Abnormal levels of other serum enzymes: Secondary | ICD-10-CM | POA: Diagnosis not present

## 2017-08-16 NOTE — Progress Notes (Signed)
Patient ID: Dawn Wolfe, female   DOB: [birth year], 72 y.o.   MRN: [id number]   Subjective:    Patient ID: Dawn Wolfe, female    DOB: February 04, 1945, 72 y.o.   MRN: [id number]  HPI  Patient here for a scheduled follow up.  She is accompanied by her husband.  History obtained from both of them.  She reports she has been doing relatively well.  Stays active.  No chest pain.  Breathing stable.  No sob.  Did give blood recently.  States felt washed out after donating.  Is starting to get better now.  No chest pain.  No sob.  No acid reflux.  No abdominal pain.  Bowels moving.  Blood pressure doing well.  No headache.     Past Medical History:  Diagnosis Date  . Anemia    h/o as a child  . Arthritis    left shoulder  . 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 Surgical removal  . Complication of anesthesia   . Hemorrhoids   . History of colonoscopy 2003   Normal, Dr. Sammuel Cooper, [place]   . Hypercholesterolemia   . Normal exercise sestamibi stress test 2008   Ken Fath  . Obstructive sleep apnea on CPAP 2007  . PONV (postoperative nausea and vomiting)   . Rotator cuff tear, left   . Sleep apnea    Past Surgical History:  Procedure Laterality Date  . ABDOMINALPLASTY W/ LIPOSUCTION  AGE 85  . BUNIONECTOMY  X3  LAST ONE 2003  . CHOLECYSTECTOMY N/A 04/19/2015   Procedure: LAPAROSCOPIC CHOLECYSTECTOMY WITH INTRAOPERATIVE CHOLANGIOGRAM;  Surgeon: Robert Bellow, MD;  Location: [facility] ORS;  Service: General;  Laterality: N/A;  . COLONOSCOPY  2012   Dr Wyline Mood in [place]  . COLONOSCOPY WITH PROPOFOL N/A 09/11/2016   Procedure: COLONOSCOPY WITH PROPOFOL;  Surgeon: Lollie Sails, MD;  Location[facility]COPY;  Service: Endoscopy;  Laterality: N/A;  . ERCP N/A 04/29/2015   Procedure: ENDOSCOPIC RETROGRADE CHOLANGIOPANCREATOGRAPHY (ERCP);  Surgeon: Hulen Luster, MD;  Location: [facility]COPY;  Service: Gastroenterology;  Laterality: N/A;  . EYE SURGERY     cataract  extraction  . NASAL SEPTUM SURGERY  AGE 73  . NASAL SINUS SURGERY  AGE 72  . SHOULDER ARTHROSCOPY  10/25/2011   Procedure: ARTHROSCOPY SHOULDER;  Surgeon: Magnus Sinning, MD;  Location: [facility];  Service: Orthopedics;  Laterality: Left;  with SAD, shave of the labrium   . TUBAL LIGATION  1982   Family History  Problem Relation Age of Onset  . Acute myelogenous leukemia Sister   . Polycythemia Sister 41  . Emphysema Father        smoker  . Heart disease Father        myocardial infarction - 52  . COPD Father   . Heart attack Father   . Breast cancer Paternal Aunt   . Testicular cancer Brother   . Heart disease Mother   . Emphysema Mother   . COPD Mother   . Asthma Mother   . Heart attack Mother   . Leukemia Sister   . Colon cancer Neg Hx    Social History   Socioeconomic History  . Marital status: Married    Spouse name: Not on file  . Number of children: Not on file  . Years of education: Not on file  . Highest education level: Not on file  Occupational History  . Not on  file  Social Needs  . Financial resource strain: Not on file  . Food insecurity:    Worry: Not on file    Inability: Not on file  . Transportation needs:    Medical: Not on file    Non-medical: Not on file  Tobacco Use  . Smoking status: Former Smoker    Packs/day: 2.50    Years: 10.00    Pack years: 25.00    Types: Cigarettes    Last attempt to quit: 03/28/1977    Years since quitting: 40.4  . Smokeless tobacco: Never Used  Substance and Sexual Activity  . Alcohol use: No    Alcohol/week: 0.0 oz  . Drug use: No  . Sexual activity: Never  Lifestyle  . Physical activity:    Days per week: Not on file    Minutes per session: Not on file  . Stress: Not on file  Relationships  . Social connections:    Talks on phone: Not on file    Gets together: Not on file    Attends religious service: Not on file    Active member of club or organization: Not on file    Attends  meetings of clubs or organizations: Not on file    Relationship status: Not on file  Other Topics Concern  . Not on file  Social History Narrative  . Not on file    Outpatient Encounter Medications as of 08/16/2017  Medication Sig  . aspirin 81 MG tablet Take 81 mg by mouth daily.   . fluticasone (FLONASE) 50 MCG/ACT nasal spray Place 2 sprays into both nostrils as needed.  . latanoprost (XALATAN) 0.005 % ophthalmic solution Place 1 drop into both eyes.  . pravastatin (PRAVACHOL) 10 MG tablet Take 1 tablet (10 mg total) by mouth daily.  . [DISCONTINUED] bimatoprost (LUMIGAN) 0.03 % ophthalmic solution 1 drop at bedtime.  . [DISCONTINUED] Multiple Vitamin (MULTIVITAMIN) tablet Take 1 tablet by mouth daily.   . [DISCONTINUED] Omega-3 Fatty Acids (OMEGA-3 FISH OIL PO) Take by mouth.   No facility-administered encounter medications on file as of 08/16/2017.     Review of Systems  Constitutional: Positive for fatigue. Negative for appetite change and unexpected weight change.  HENT: Negative for congestion and sinus pressure.   Respiratory: Negative for cough, chest tightness and shortness of breath.   Cardiovascular: Negative for chest pain, palpitations and leg swelling.  Gastrointestinal: Negative for abdominal pain, diarrhea, nausea and vomiting.  Genitourinary: Negative for difficulty urinating and dysuria.  Musculoskeletal: Negative for joint swelling and myalgias.  Skin: Negative for color change and rash.  Neurological: Negative for dizziness, light-headedness and headaches.  Psychiatric/Behavioral: Negative for agitation and dysphoric mood.       Objective:     Blood pressure rechecked by me:  120/72  Physical Exam  Constitutional: She appears well-developed and well-nourished. No distress.  HENT:  Nose: Nose normal.  Mouth/Throat: Oropharynx is clear and moist.  Neck: Neck supple. No thyromegaly present.  Cardiovascular: Normal rate and regular rhythm.  Pulmonary/Chest:  Breath sounds normal. No respiratory distress. She has no wheezes.  Abdominal: Soft. Bowel sounds are normal. There is no tenderness.  Musculoskeletal: She exhibits no edema or tenderness.  Lymphadenopathy:    She has no cervical adenopathy.  Skin: No rash noted. No erythema.  Psychiatric: She has a normal mood and affect. Her behavior is normal.    BP 112/70 (BP Location: Left Arm, Patient Position: Sitting, Cuff Size: Normal)   Pulse 85  Temp 98.1 F (36.7 C) (Oral)   Resp 18   Wt 154 lb 9.6 oz (70.1 kg)   SpO2 96%   BMI 24.95 kg/m  Wt Readings from Last 3 Encounters:  08/16/17 154 lb 9.6 oz (70.1 kg)  02/15/17 156 lb 12.8 oz (71.1 kg)  10/09/16 157 lb 9.6 oz (71.5 kg)     Lab Results  Component Value Date   WBC 4.6 08/15/2017   HGB 12.0 08/15/2017   HCT 34.2 (L) 08/15/2017   PLT 161.0 08/15/2017   GLUCOSE 118 (H) 08/15/2017   CHOL 158 08/15/2017   TRIG 184.0 (H) 08/15/2017   HDL 40.10 08/15/2017   LDLDIRECT 88.0 07/22/2014   LDLCALC 81 08/15/2017   ALT 19 08/15/2017   AST 20 08/15/2017   NA 140 08/15/2017   K 4.6 08/15/2017   CL 105 08/15/2017   CREATININE 0.88 08/15/2017   BUN 18 08/15/2017   CO2 29 08/15/2017   TSH 2.61 08/15/2017   HGBA1C 5.9 08/15/2017    Mm Digital Diagnostic Unilat L  Result Date: 08/02/2017 CLINICAL DATA:  Screening recall for left breast calcifications. EXAM: DIGITAL DIAGNOSTIC LEFT MAMMOGRAM WITH CAD COMPARISON:  Previous exam(s). ACR Breast Density Category c: The breast tissue is heterogeneously dense, which may obscure small masses. FINDINGS: There is a small faint group of calcifications in the upper-outer quadrant of the left breast. The difference in appearance as compared to prior mammograms is likely due to differences in technique. On the spot compression magnification images, there are no suspicious forms or distribution. Mammographic images were processed with CAD. IMPRESSION: There is a benign group of calcifications in the  upper-outer quadrant of the left breast. RECOMMENDATION: Screening mammogram in one year.(Code:SM-B-01Y) I have discussed the findings and recommendations with the patient. Results were also provided in writing at the conclusion of the visit. If applicable, a reminder letter will be sent to the patient regarding the next appointment. BI-RADS CATEGORY  2: Benign. Electronically Signed   By: Ammie Ferrier M.D.   On: 08/02/2017 11:04       Assessment & Plan:   Problem List Items Addressed This Visit    Abnormal liver enzymes    Previous abdominal ultrasound revealed no acute abnormality.  Recent liver panel wnl.  Follow.        Hypercholesteremia    On pravastatin.  Low cholesterol diet and exercise.  Follow lipid panel and liver function tests.  Discussed labs.   Lab Results  Component Value Date   CHOL 158 08/15/2017   HDL 40.10 08/15/2017   LDLCALC 81 08/15/2017   LDLDIRECT 88.0 07/22/2014   TRIG 184.0 (H) 08/15/2017   CHOLHDL 4 08/15/2017        Relevant Orders   Hepatic function panel   Lipid panel   Hyperglycemia    Low carb diet and exercise.  Follow met b and a1c.        Relevant Orders   Hemoglobin U6J   Basic metabolic panel   Obstructive sleep apnea    Continue with CPAP.         Other Visit Diagnoses    Other fatigue    -  Primary   Noticed after giving blood.  Discussed with her today.  Feels better.  Wants to hold on any further evaluation.  Follow.  Notify if persistent.        Einar Pheasant, MD

## 2017-08-19 ENCOUNTER — Encounter: Payer: Self-pay | Admitting: Internal Medicine

## 2017-08-19 NOTE — Assessment & Plan Note (Addendum)
On pravastatin.  Low cholesterol diet and exercise.  Follow lipid panel and liver function tests.  Discussed labs.   Lab Results  Component Value Date   CHOL 158 08/15/2017   HDL 40.10 08/15/2017   LDLCALC 81 08/15/2017   LDLDIRECT 88.0 07/22/2014   TRIG 184.0 (H) 08/15/2017   CHOLHDL 4 08/15/2017

## 2017-08-19 NOTE — Assessment & Plan Note (Signed)
Previous abdominal ultrasound revealed no acute abnormality.  Recent liver panel wnl.  Follow.

## 2017-08-19 NOTE — Assessment & Plan Note (Signed)
Continue with CPAP

## 2017-08-19 NOTE — Assessment & Plan Note (Signed)
Low carb diet and exercise.  Follow met b and a1c.   

## 2017-10-11 ENCOUNTER — Other Ambulatory Visit: Payer: Self-pay | Admitting: Internal Medicine

## 2017-11-13 DIAGNOSIS — G4733 Obstructive sleep apnea (adult) (pediatric): Secondary | ICD-10-CM | POA: Diagnosis not present

## 2017-12-03 DIAGNOSIS — H40153 Residual stage of open-angle glaucoma, bilateral: Secondary | ICD-10-CM | POA: Diagnosis not present

## 2017-12-18 DIAGNOSIS — D2262 Melanocytic nevi of left upper limb, including shoulder: Secondary | ICD-10-CM | POA: Diagnosis not present

## 2017-12-18 DIAGNOSIS — D2261 Melanocytic nevi of right upper limb, including shoulder: Secondary | ICD-10-CM | POA: Diagnosis not present

## 2017-12-18 DIAGNOSIS — D2272 Melanocytic nevi of left lower limb, including hip: Secondary | ICD-10-CM | POA: Diagnosis not present

## 2017-12-18 DIAGNOSIS — D225 Melanocytic nevi of trunk: Secondary | ICD-10-CM | POA: Diagnosis not present

## 2018-01-10 ENCOUNTER — Other Ambulatory Visit: Payer: Self-pay | Admitting: Internal Medicine

## 2018-02-20 ENCOUNTER — Encounter: Payer: Self-pay | Admitting: Internal Medicine

## 2018-02-20 ENCOUNTER — Other Ambulatory Visit (INDEPENDENT_AMBULATORY_CARE_PROVIDER_SITE_OTHER): Payer: Medicare Other

## 2018-02-20 DIAGNOSIS — R739 Hyperglycemia, unspecified: Secondary | ICD-10-CM | POA: Diagnosis not present

## 2018-02-20 DIAGNOSIS — E7800 Pure hypercholesterolemia, unspecified: Secondary | ICD-10-CM

## 2018-02-20 LAB — HEMOGLOBIN A1C: Hgb A1c MFr Bld: 6 % (ref 4.6–6.5)

## 2018-02-20 LAB — BASIC METABOLIC PANEL
BUN: 17 mg/dL (ref 6–23)
CO2: 25 meq/L (ref 19–32)
Calcium: 9.7 mg/dL (ref 8.4–10.5)
Chloride: 106 mEq/L (ref 96–112)
Creatinine, Ser: 0.94 mg/dL (ref 0.40–1.20)
GFR: 58.63 mL/min — ABNORMAL LOW (ref >60.00)
Glucose, Bld: 112 mg/dL — ABNORMAL HIGH (ref 70–99)
Potassium: 4.3 mEq/L (ref 3.5–5.1)
Sodium: 141 mEq/L (ref 135–145)

## 2018-02-20 LAB — HEPATIC FUNCTION PANEL
ALT: 28 U/L (ref 0–35)
AST: 33 U/L (ref 0–37)
Albumin: 4.6 g/dL (ref 3.5–5.2)
Alkaline Phosphatase: 58 U/L (ref 39–117)
Bilirubin, Direct: 0.1 mg/dL (ref 0.0–0.3)
Total Bilirubin: 0.8 mg/dL (ref 0.2–1.2)
Total Protein: 7.5 g/dL (ref 6.0–8.3)

## 2018-02-20 LAB — LIPID PANEL
Cholesterol: 157 mg/dL (ref 0–200)
HDL: 41.5 mg/dL (ref >39.00)
LDL Cholesterol: 92 mg/dL (ref 0–99)
NonHDL: 115.26
Total CHOL/HDL Ratio: 4
Triglycerides: 115 mg/dL (ref 0.0–149.0)
VLDL: 23 mg/dL (ref 0.0–40.0)

## 2018-02-21 ENCOUNTER — Encounter: Payer: Self-pay | Admitting: Internal Medicine

## 2018-02-21 ENCOUNTER — Ambulatory Visit: Payer: Medicare Other | Admitting: Internal Medicine

## 2018-02-21 VITALS — BP 124/70 | HR 90 | Temp 98.1°F | Resp 16 | Ht 66.0 in | Wt 157.8 lb

## 2018-02-21 DIAGNOSIS — R748 Abnormal levels of other serum enzymes: Secondary | ICD-10-CM

## 2018-02-21 DIAGNOSIS — E7800 Pure hypercholesterolemia, unspecified: Secondary | ICD-10-CM

## 2018-02-21 DIAGNOSIS — G4733 Obstructive sleep apnea (adult) (pediatric): Secondary | ICD-10-CM

## 2018-02-21 DIAGNOSIS — Z0000 Encounter for general adult medical examination without abnormal findings: Secondary | ICD-10-CM

## 2018-02-21 DIAGNOSIS — R739 Hyperglycemia, unspecified: Secondary | ICD-10-CM

## 2018-02-21 NOTE — Assessment & Plan Note (Signed)
Physical today 02/21/18.  Mammogram 07/25/17 - birads 0.  F/u left breast mammogram 08/02/17 - birads II.  Recommended f/u in one year.  Colonoscopy 8//27/18.

## 2018-02-21 NOTE — Progress Notes (Signed)
Patient ID: Dawn Wolfe, female   DOB: [birth year], 73 y.o.   MRN: [id number]   Subjective:    Patient ID: Dawn Wolfe, female    DOB: 05-25-1945, 73 y.o.   MRN: [id number]  HPI  Patient here for her physical exam.  She reports she is doing well.  Feels good.  Stays active.  No chest pain.  No sob.  No acid reflux.  No abdominal pain.  Bowels moving.  No urine change.  Staying active.  Overall feels good.     Past Medical History:  Diagnosis Date  . Anemia    h/o as a child  . Arthritis    left shoulder  . Cancer ([place]) 15 yrs ago   [employer] on the face. Surgical removal  . Complication of anesthesia   . Hemorrhoids   . History of colonoscopy 2003   Normal, Dr. Sammuel Cooper, [place]   . Hypercholesterolemia   . Normal exercise sestamibi stress test 2008   Ken Fath  . Obstructive sleep apnea on CPAP 2007  . PONV (postoperative nausea and vomiting)   . Rotator cuff tear, left   . Sleep apnea    Past Surgical History:  Procedure Laterality Date  . ABDOMINALPLASTY W/ LIPOSUCTION  AGE 73  . BUNIONECTOMY  X3  LAST ONE 2003  . CHOLECYSTECTOMY N/A 04/19/2015   Procedure: LAPAROSCOPIC CHOLECYSTECTOMY WITH INTRAOPERATIVE CHOLANGIOGRAM;  Surgeon: Robert Bellow, MD;  Location: [facility] ORS;  Service: General;  Laterality: N/A;  . COLONOSCOPY  2012   Dr Wyline Mood in [place]  . COLONOSCOPY WITH PROPOFOL N/A 09/11/2016   Procedure: COLONOSCOPY WITH PROPOFOL;  Surgeon: Lollie Sails, MD;  Location[facility] ENDOSCOPY;  Service: Endoscopy;  Laterality: N/A;  . ERCP N/A 04/29/2015   Procedure: ENDOSCOPIC RETROGRADE CHOLANGIOPANCREATOGRAPHY (ERCP);  Surgeon: Hulen Luster, MD;  Location: [facility] ENDOSCOPY;  Service: Gastroenterology;  Laterality: N/A;  . EYE SURGERY     cataract extraction  . NASAL SEPTUM SURGERY  AGE 54  . NASAL SINUS SURGERY  AGE 25  . SHOULDER ARTHROSCOPY  10/25/2011   Procedure: ARTHROSCOPY SHOULDER;  Surgeon: Magnus Sinning, MD;  Location: [facility];  Service: Orthopedics;  Laterality: Left;  with SAD, shave of the labrium   . TUBAL LIGATION  1982   Family History  Problem Relation Age of Onset  . Acute myelogenous leukemia Sister   . Polycythemia Sister 8  . Emphysema Father        smoker  . Heart disease Father        myocardial infarction - 82  . COPD Father   . Heart attack Father   . Breast cancer Paternal Aunt   . Testicular cancer Brother   . Heart disease Mother   . Emphysema Mother   . COPD Mother   . Asthma Mother   . Heart attack Mother   . Leukemia Sister   . Colon cancer Neg Hx    Social History   Socioeconomic History  . Marital status: Married    Spouse name: Not on file  . Number of children: Not on file  . Years of education: Not on file  . Highest education level: Not on file  Occupational History  . Not on file  Social Needs  . Financial resource strain: Not on file  . Food insecurity:    Worry: Not on file    Inability: Not on file  . Transportation needs:    Medical: Not on  file    Non-medical: Not on file  Tobacco Use  . Smoking status: Former Smoker    Packs/day: 2.50    Years: 10.00    Pack years: 25.00    Types: Cigarettes    Last attempt to quit: 03/28/1977    Years since quitting: 40.9  . Smokeless tobacco: Never Used  Substance and Sexual Activity  . Alcohol use: No    Alcohol/week: 0.0 standard drinks  . Drug use: No  . Sexual activity: Never  Lifestyle  . Physical activity:    Days per week: Not on file    Minutes per session: Not on file  . Stress: Not on file  Relationships  . Social connections:    Talks on phone: Not on file    Gets together: Not on file    Attends religious service: Not on file    Active member of club or organization: Not on file    Attends meetings of clubs or organizations: Not on file    Relationship status: Not on file  Other Topics Concern  . Not on file  Social History Narrative  . Not on file    Outpatient Encounter  Medications as of 02/21/2018  Medication Sig  . aspirin 81 MG tablet Take 81 mg by mouth daily.   . fluticasone (FLONASE) 50 MCG/ACT nasal spray Place 2 sprays into both nostrils as needed.  . latanoprost (XALATAN) 0.005 % ophthalmic solution Place 1 drop into both eyes.  . pravastatin (PRAVACHOL) 10 MG tablet TAKE ONE TABLET BY MOUTH EVERY DAY   No facility-administered encounter medications on file as of 02/21/2018.     Review of Systems  Constitutional: Negative for appetite change and unexpected weight change.  HENT: Negative for congestion and sinus pressure.   Eyes: Negative for pain and visual disturbance.  Respiratory: Negative for cough, chest tightness and shortness of breath.   Cardiovascular: Negative for chest pain, palpitations and leg swelling.  Gastrointestinal: Negative for abdominal pain, diarrhea, nausea and vomiting.  Genitourinary: Negative for difficulty urinating and dysuria.  Musculoskeletal: Negative for joint swelling and myalgias.  Skin: Negative for color change and rash.  Neurological: Negative for dizziness, light-headedness and headaches.  Hematological: Negative for adenopathy. Does not bruise/bleed easily.  Psychiatric/Behavioral: Negative for agitation and dysphoric mood.       Objective:    Physical Exam Constitutional:      General: She is not in acute distress.    Appearance: Normal appearance. She is well-developed.  HENT:     Nose: Nose normal. No congestion.     Mouth/Throat:     Pharynx: No oropharyngeal exudate or posterior oropharyngeal erythema.  Eyes:     General: No scleral icterus.       Right eye: No discharge.        Left eye: No discharge.  Neck:     Musculoskeletal: Neck supple. No muscular tenderness.     Thyroid: No thyromegaly.  Cardiovascular:     Rate and Rhythm: Normal rate and regular rhythm.  Pulmonary:     Effort: No tachypnea, accessory muscle usage or respiratory distress.     Breath sounds: Normal breath sounds.  No decreased breath sounds or wheezing.  Chest:     Breasts:        Right: No inverted nipple, mass, nipple discharge or tenderness (no axillary adenopathy).        Left: No inverted nipple, mass, nipple discharge or tenderness (no axilarry adenopathy).  Abdominal:  General: Bowel sounds are normal.     Palpations: Abdomen is soft.     Tenderness: There is no abdominal tenderness.  Musculoskeletal:        General: No swelling or tenderness.  Lymphadenopathy:     Cervical: No cervical adenopathy.  Skin:    Findings: No erythema or rash.  Neurological:     Mental Status: She is alert and oriented to person, place, and time.  Psychiatric:        Mood and Affect: Mood normal.        Behavior: Behavior normal.     BP 124/70 (BP Location: Left Arm, Patient Position: Sitting, Cuff Size: Normal)   Pulse 90   Temp 98.1 F (36.7 C) (Oral)   Resp 16   Ht 5' 6"  (1.676 m)   Wt 157 lb 12.8 oz (71.6 kg)   SpO2 97%   BMI 25.47 kg/m  Wt Readings from Last 3 Encounters:  02/21/18 157 lb 12.8 oz (71.6 kg)  08/16/17 154 lb 9.6 oz (70.1 kg)  02/15/17 156 lb 12.8 oz (71.1 kg)     Lab Results  Component Value Date   WBC 4.6 08/15/2017   HGB 12.0 08/15/2017   HCT 34.2 (L) 08/15/2017   PLT 161.0 08/15/2017   GLUCOSE 112 (H) 02/20/2018   CHOL 157 02/20/2018   TRIG 115.0 02/20/2018   HDL 41.50 02/20/2018   LDLDIRECT 88.0 07/22/2014   LDLCALC 92 02/20/2018   ALT 28 02/20/2018   AST 33 02/20/2018   NA 141 02/20/2018   K 4.3 02/20/2018   CL 106 02/20/2018   CREATININE 0.94 02/20/2018   BUN 17 02/20/2018   CO2 25 02/20/2018   TSH 2.61 08/15/2017   HGBA1C 6.0 02/20/2018    Mm Digital Diagnostic Unilat L  Result Date: 08/02/2017 CLINICAL DATA:  Screening recall for left breast calcifications. EXAM: DIGITAL DIAGNOSTIC LEFT MAMMOGRAM WITH CAD COMPARISON:  Previous exam(s). ACR Breast Density Category c: The breast tissue is heterogeneously dense, which may obscure small masses.  FINDINGS: There is a small faint group of calcifications in the upper-outer quadrant of the left breast. The difference in appearance as compared to prior mammograms is likely due to differences in technique. On the spot compression magnification images, there are no suspicious forms or distribution. Mammographic images were processed with CAD. IMPRESSION: There is a benign group of calcifications in the upper-outer quadrant of the left breast. RECOMMENDATION: Screening mammogram in one year.(Code:SM-B-01Y) I have discussed the findings and recommendations with the patient. Results were also provided in writing at the conclusion of the visit. If applicable, a reminder letter will be sent to the patient regarding the next appointment. BI-RADS CATEGORY  2: Benign. Electronically Signed   By: Ammie Ferrier M.D.   On: 08/02/2017 11:04       Assessment & Plan:   Problem List Items Addressed This Visit    Abnormal liver enzymes    Previous abdominal ultrasound revealed no abnormality.  Recent liver panel wnl.  Follow liver function tests.        Health care maintenance    Physical today 02/21/18.  Mammogram 07/25/17 - birads 0.  F/u left breast mammogram 08/02/17 - birads II.  Recommended f/u in one year.  Colonoscopy 8//27/18.        Hypercholesteremia    On pravastatin.  Triglycerides improved.  Low cholesterol diet and exercise.  Follow lipid panel.   Lab Results  Component Value Date   CHOL 157 02/20/2018  HDL 41.50 02/20/2018   LDLCALC 92 02/20/2018   LDLDIRECT 88.0 07/22/2014   TRIG 115.0 02/20/2018   CHOLHDL 4 02/20/2018        Relevant Orders   CBC with Differential/Platelet   Hepatic function panel   Lipid panel   TSH   Basic metabolic panel   Hyperglycemia    Low carb diet and exercise.  Follow met b and a1c.        Relevant Orders   Hemoglobin A1c   Obstructive sleep apnea    Continue CPAP.  Uses regularly.         Other Visit Diagnoses    Routine general medical  examination at a health care facility    -  Primary       Einar Pheasant, MD

## 2018-02-24 ENCOUNTER — Encounter: Payer: Self-pay | Admitting: Internal Medicine

## 2018-02-24 NOTE — Assessment & Plan Note (Signed)
Low carb diet and exercise.  Follow met b and a1c.   

## 2018-02-24 NOTE — Assessment & Plan Note (Signed)
Previous abdominal ultrasound revealed no abnormality.  Recent liver panel wnl.  Follow liver function tests.

## 2018-02-24 NOTE — Assessment & Plan Note (Signed)
On pravastatin.  Triglycerides improved.  Low cholesterol diet and exercise.  Follow lipid panel.   Lab Results  Component Value Date   CHOL 157 02/20/2018   HDL 41.50 02/20/2018   LDLCALC 92 02/20/2018   LDLDIRECT 88.0 07/22/2014   TRIG 115.0 02/20/2018   CHOLHDL 4 02/20/2018

## 2018-02-24 NOTE — Assessment & Plan Note (Addendum)
Continue CPAP.  Uses regularly.

## 2018-02-26 DIAGNOSIS — G4733 Obstructive sleep apnea (adult) (pediatric): Secondary | ICD-10-CM | POA: Diagnosis not present

## 2018-05-01 ENCOUNTER — Other Ambulatory Visit: Payer: Self-pay

## 2018-05-01 ENCOUNTER — Ambulatory Visit (INDEPENDENT_AMBULATORY_CARE_PROVIDER_SITE_OTHER): Payer: Medicare Other

## 2018-05-01 DIAGNOSIS — Z0000 Encounter for general adult medical examination without abnormal findings: Secondary | ICD-10-CM

## 2018-05-01 DIAGNOSIS — Z760 Encounter for issue of repeat prescription: Secondary | ICD-10-CM | POA: Diagnosis not present

## 2018-05-01 MED ORDER — FLUTICASONE PROPIONATE 50 MCG/ACT NA SUSP: 2.0000 | NASAL | 3 refills | Status: DC | PRN

## 2018-05-01 NOTE — Patient Instructions (Addendum)
  Dawn Wolfe , Thank you for taking time to come for your Medicare Wellness Visit. I appreciate your ongoing commitment to your health goals. Please review the following plan we discussed and let me know if I can assist you in the future.   These are the goals we discussed: Goals      Patient Stated   . DIET - REDUCE SUGAR INTAKE (pt-stated)       This is a list of the screening recommended for you and due dates:  Health Maintenance  Topic Date Due  . Mammogram  08/03/2019  . Tetanus Vaccine  02/23/2025  . Colon Cancer Screening  09/12/2026  . DEXA scan (bone density measurement)  Completed  .  Hepatitis C: One time screening is recommended by Center for Disease Control  (CDC) for  adults born from [birth year] through 1964.   Completed  . Pneumonia vaccines  Completed  . Flu Shot  Discontinued

## 2018-05-01 NOTE — Progress Notes (Signed)
Subjective:   NADIYA PIERATT is a 73 y.o. female who presents for Medicare Annual (Subsequent) preventive examination.  Review of Systems:  No ROS.  Medicare Wellness Visit. Additional risk factors are reflected in the social history. Cardiac Risk Factors include: advanced age (>49men, >87 women)     Objective:     Vitals: There were no vitals taken for this visit.  There is no height or weight on file to calculate BMI.   UTA vital signs, virtual visit. Advanced Directives 05/01/2018 09/11/2016 04/29/2015 11/26/2014 10/25/2011  Does Patient Have a Medical Advance Directive? Yes Yes Yes Yes Patient has advance directive, copy not in chart  Type of Advance Directive [place];Living will [place];Living will [place];Living will Living will;Healthcare Power of Attorney Living will;Healthcare Power of Attorney  Does patient want to make changes to medical advance directive? No - Patient declined - - No - Patient declined -  Copy of [place] in Chart? No - copy requested No - copy requested No - copy requested No - copy requested Copy requested from family  Pre-existing out of facility DNR order (yellow form or pink MOST form) - - - - No    Tobacco Social History   Tobacco Use  Smoking Status Former Smoker  . Packs/day: 2.50  . Years: 10.00  . Pack years: 25.00  . Types: Cigarettes  . Last attempt to quit: 03/28/1977  . Years since quitting: 41.1  Smokeless Tobacco Never Used     Counseling given: Not Answered   Clinical Intake:  Pre-visit preparation completed: Yes        Diabetes: No  How often do you need to have someone help you when you read instructions, pamphlets, or other written materials from your doctor or pharmacy?: 1 - Never  Interpreter Needed?: No     Past Medical History:  Diagnosis Date  . Anemia    h/o as a child  . Arthritis    left shoulder  . 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 Surgical removal  . Complication of anesthesia   . Hemorrhoids   . History of colonoscopy 2003   Normal, Dr. Sammuel Cooper, [place]   . Hypercholesterolemia   . Normal exercise sestamibi stress test 2008   Ken Fath  . Obstructive sleep apnea on CPAP 2007  . PONV (postoperative nausea and vomiting)   . Rotator cuff tear, left   . Sleep apnea    Past Surgical History:  Procedure Laterality Date  . ABDOMINALPLASTY W/ LIPOSUCTION  AGE 71  . BUNIONECTOMY  X3  LAST ONE 2003  . CHOLECYSTECTOMY N/A 04/19/2015   Procedure: LAPAROSCOPIC CHOLECYSTECTOMY WITH INTRAOPERATIVE CHOLANGIOGRAM;  Surgeon: Robert Bellow, MD;  Location: [facility] ORS;  Service: General;  Laterality: N/A;  . COLONOSCOPY  2012   Dr Wyline Mood in [place]  . COLONOSCOPY WITH PROPOFOL N/A 09/11/2016   Procedure: COLONOSCOPY WITH PROPOFOL;  Surgeon: Lollie Sails, MD;  [facility]: [facility] ENDOSCOPY;  Service: Endoscopy;  Laterality: N/A;  . ERCP N/A 04/29/2015   Procedure: ENDOSCOPIC RETROGRADE CHOLANGIOPANCREATOGRAPHY (ERCP);  Surgeon: Hulen Luster, MD;  Location: [facility] ENDOSCOPY;  Service: Gastroenterology;  Laterality: N/A;  . EYE SURGERY     cataract extraction  . NASAL SEPTUM SURGERY  AGE 74  . NASAL SINUS SURGERY  AGE 71  . SHOULDER ARTHROSCOPY  10/25/2011   Procedure: ARTHROSCOPY SHOULDER;  Surgeon: Magnus Sinning, MD;  Location: [place];  Service: Orthopedics;  Laterality: Left;  with SAD, shave of the labrium   . TUBAL LIGATION  1982   Family History  Problem Relation Age of Onset  . Acute myelogenous leukemia Sister   . Polycythemia Sister 41  . Emphysema Father        smoker  . Heart disease Father        myocardial infarction - 70  . COPD Father   . Heart attack Father   . Breast cancer Paternal Aunt   . Testicular cancer Brother   . Heart disease Mother   . Emphysema Mother   . COPD Mother   . Asthma Mother   . Heart attack Mother   . Leukemia Sister    . Colon cancer Neg Hx    Social History   Socioeconomic History  . Marital status: Married    Spouse name: Not on file  . Number of children: Not on file  . Years of education: Not on file  . Highest education level: Not on file  Occupational History  . Not on file  Social Needs  . Financial resource strain: Not hard at all  . Food insecurity:    Worry: Never true    Inability: Never true  . Transportation needs:    Medical: No    Non-medical: No  Tobacco Use  . Smoking status: Former Smoker    Packs/day: 2.50    Years: 10.00    Pack years: 25.00    Types: Cigarettes    Last attempt to quit: 03/28/1977    Years since quitting: 41.1  . Smokeless tobacco: Never Used  Substance and Sexual Activity  . Alcohol use: No    Alcohol/week: 0.0 standard drinks  . Drug use: No  . Sexual activity: Never  Lifestyle  . Physical activity:    Days per week: Not on file    Minutes per session: Not on file  . Stress: Not on file  Relationships  . Social connections:    Talks on phone: Not on file    Gets together: Not on file    Attends religious service: Not on file    Active member of club or organization: Not on file    Attends meetings of clubs or organizations: Not on file    Relationship status: Not on file  Other Topics Concern  . Not on file  Social History Narrative  . Not on file    Outpatient Encounter Medications as of 05/01/2018  Medication Sig  . aspirin 81 MG tablet Take 81 mg by mouth daily.   . fluticasone (FLONASE) 50 MCG/ACT nasal spray Place 2 sprays into both nostrils as needed.  . latanoprost (XALATAN) 0.005 % ophthalmic solution Place 1 drop into both eyes.  . pravastatin (PRAVACHOL) 10 MG tablet TAKE ONE TABLET BY MOUTH EVERY DAY   No facility-administered encounter medications on file as of 05/01/2018.     Activities of Daily Living In your present state of health, do you have any difficulty performing the following activities: 05/01/2018  Hearing?  N  Vision? N  Difficulty concentrating or making decisions? N  Walking or climbing stairs? N  Dressing or bathing? N  Doing errands, shopping? N  Preparing Food and eating ? N  Using the Toilet? N  In the past six months, have you accidently leaked urine? N  Do you have problems with loss of bowel control? N  Managing your Medications? N  Managing  your Finances? N  Housekeeping or managing your Housekeeping? N  Some recent data might be hidden    Patient Care Team: Einar Pheasant, MD as PCP - General (Internal Medicine) Einar Pheasant, MD (Internal Medicine) Bary Castilla Forest Gleason, MD (General Surgery)    Assessment:   This is a routine wellness examination for [place].  I connected with patient 05/01/18 at 10:00 AM EDT by a video enabled telemedicine application and verified that I am speaking with the correct person using two identifiers. Patient stated full name and DOB. Patient gave permission to continue with virtual visit. Patient's location was at home and Nurse's location was at [place] office.   Fluticasone refill sent to her local pharmacy.  Keep all routine maintenance appointments.  Health Screenings  Mammogram -08/02/17 Colonoscopy -09/11/16 Bone Density -04/21/08 Glaucoma -suspect; drops in use Hearing -demonstrates normal hearing during conversation. Hemoglobin A1C -02/20/18 (6.0) Cholesterol -02/20/18 (157) Dental- visits every 6 months Vision- visits every 3 months  Social  Alcohol intake -no Smoking history- former Smokers in home? none Illicit drug use? none Exercise -walking Diet -low carb Sexually Active -never  Safety  Patient feels safe at home.  Patient does have smoke detectors at home  Patient does wear sunscreen or protective clothing when in direct sunlight  Patient does wear seat belt when driving or riding with others.   Activities of Daily Living Patient can do their own household chores. Denies needing assistance with: driving, feeding  themselves, getting from bed to chair, getting to the toilet, bathing/showering, dressing, managing money, climbing flight of stairs, or preparing meals.   Depression Screen Patient denies losing interest in daily life, feeling hopeless, or crying easily over simple problems.   Fall Screen Patient denies being afraid of falling or falling in the last year.   Memory Screen Patient denies problems with memory, misplacing items, and is able to balance checkbook/bank accounts.  Patient is alert, normal appearance, oriented to person/place/and time. Correctly identified the president of the [place], recall of 3/3 objects, and performing simple calculations.  Patient displays appropriate judgement and can read correct time from watch face.   Immunizations The following Immunizations are up to date: Influenza, shingles, pneumonia, and tetanus.   Other Providers Patient Care Team: Einar Pheasant, MD as PCP - General (Internal Medicine) Einar Pheasant, MD (Internal Medicine) Bary Castilla Forest Gleason, MD (General Surgery)  Exercise Activities and Dietary recommendations Current Exercise Habits: Home exercise routine, Type of exercise: walking(push lawn mowing), Time (Minutes): 30, Frequency (Times/Week): 3, Weekly Exercise (Minutes/Week): 90, Intensity: Mild  Goals      Patient Stated   . DIET - REDUCE SUGAR INTAKE (pt-stated)       Fall Risk Fall Risk  05/01/2018 08/16/2017 08/10/2016 02/01/2015 11/26/2014  Falls in the past year? 0 No No Yes No  Number falls in past yr: - - - - -  Injury with Fall? - - - - -  Risk for fall due to : - - (No Data) - -  Risk for fall due to: Comment - - no falls - -  Depression Screen PHQ 2/9 Scores 05/01/2018 08/16/2017 08/10/2016 02/01/2015  PHQ - 2 Score 0 0 0 0  PHQ- 9 Score - - 0 -     Cognitive Function MMSE - Mini Mental State Exam 11/26/2014  Orientation to time 5  Orientation to Place 5  Registration 3  Attention/ Calculation 5  Recall 3  Language-  name 2 objects 2  Language- repeat 1  Language- follow  3 step command 3  Language- read & follow direction 1  Write a sentence 1  Copy design 1  Total score 30     6CIT Screen 05/01/2018  What Year? 0 points  What month? 0 points  What time? 0 points  Count back from 20 0 points  Months in reverse 0 points  Repeat phrase 0 points  Total Score 0    Immunization History  Administered Date(s) Administered  . Influenza Split 10/26/2010, 10/16/2011, 10/21/2012, 08/22/2013  . Influenza, High Dose Seasonal PF 10/18/2016  . Influenza-Unspecified 10/08/2014, 10/20/2015, 10/24/2017  . Pneumococcal Conjugate-13 11/26/2014  . Pneumococcal Polysaccharide-23 12/07/2015  . Tdap 02/24/2015  . Zoster 04/10/2013   Screening Tests Health Maintenance  Topic Date Due  . MAMMOGRAM  08/03/2019  . TETANUS/TDAP  02/23/2025  . COLONOSCOPY  09/12/2026  . DEXA SCAN  Completed  . Hepatitis C Screening  Completed  . PNA vac Low Risk Adult  Completed  . INFLUENZA VACCINE  Discontinued      Plan:    End of life planning; Advance aging; Advanced directives discussed. Copy of current HCPOA/Living Will requested.    I have personally reviewed and noted the following in the patient's chart:   . Medical and social history . Use of alcohol, tobacco or illicit drugs  . Current medications and supplements . Functional ability and status . Nutritional status . Physical activity . Advanced directives . List of other physicians . Hospitalizations, surgeries, and ER visits in previous 12 months . Vitals . Screenings to include cognitive, depression, and falls . Referrals and appointments  In addition, I have reviewed and discussed with patient certain preventive protocols, quality metrics, and best practice recommendations. A written personalized care plan for preventive services as well as general preventive health recommendations were provided to patient.     Varney Biles, LPN   [id number]   Reviewed above information.  Agree with assessment and plan.    Dr Nicki Reaper

## 2018-05-27 DIAGNOSIS — G4733 Obstructive sleep apnea (adult) (pediatric): Secondary | ICD-10-CM | POA: Diagnosis not present

## 2018-06-13 DIAGNOSIS — H40153 Residual stage of open-angle glaucoma, bilateral: Secondary | ICD-10-CM | POA: Diagnosis not present

## 2018-06-13 DIAGNOSIS — H524 Presbyopia: Secondary | ICD-10-CM | POA: Diagnosis not present

## 2018-07-05 ENCOUNTER — Other Ambulatory Visit: Payer: Self-pay | Admitting: Internal Medicine

## 2018-07-24 ENCOUNTER — Telehealth: Payer: Self-pay

## 2018-07-24 ENCOUNTER — Ambulatory Visit: Payer: Self-pay

## 2018-07-24 NOTE — Telephone Encounter (Signed)
Patient called and says she took her husband to the hospital because he's been sick for about 3 weeks and he called and told her he tested positive for covid, so she will need to get tested. She denies any symptoms, says she has loose stools everyday and nothing is new as far as that. I called the office and spoke to [place], [facility] who asked to speak to the patient, the call was connected successfully.    Answer Assessment - Initial Assessment Questions 1. CLOSE CONTACT: "Who is the person with the confirmed or suspected COVID-19 infection that you were exposed to?"     Husband tested today 2. PLACE of CONTACT: "Where were you when you were exposed to COVID-19?" (e.g., home, school, medical waiting room; [place] 3. TYPE of CONTACT: "How much contact was there?" (e.g., sitting next to, live in same house, work in same office, same building)     Live in same house 4. DURATION of CONTACT: "How long were you in contact with the COVID-19 patient?" (e.g., a few seconds, passed by person, a few minutes, live with the patient)     Live with the patient 5. DATE of CONTACT: "When did you have contact with a COVID-19 patient?" (e.g., how many days ago)     Today 6. TRAVEL: "Have you traveled out of the country recently?" If so, "When and where?"     * Also ask about out-of-state travel, since the CDC has identified some high-risk cities for community spread in the [place].     * Note: Travel becomes less relevant if there is widespread community transmission where the patient lives.     No 7. COMMUNITY SPREAD: "Are there lots of cases of COVID-19 (community spread) where you live?" (See public health department website, if unsure)       No 8. SYMPTOMS: "Do you have any symptoms?" (e.g., fever, cough, breathing difficulty)     No 9. PREGNANCY OR POSTPARTUM: "Is there any chance you are pregnant?" "When was your last menstrual period?" "Did you deliver in the last 2 weeks?"      No 10. HIGH RISK: "Do you have any heart or lung problems? Do you have a weak immune system?" (e.g., CHF, COPD, asthma, HIV positive, chemotherapy, renal failure, diabetes mellitus, sickle cell anemia)     No  Protocols used: CORONAVIRUS (COVID-19) EXPOSURE-A-AH

## 2018-07-24 NOTE — Telephone Encounter (Signed)
Spoke with patient regarding her husbands COVID results. Pts husband tested positive for COVID. She will need to be tested. Scheduled patient for telephone appt at 12:30 tomorrow. Explained to pt that she will need to quaratine and self isolate at home. Will discuss further tomorrow. Pt is not having any symptoms at this time. I was not sure about where to schedule so I did let the pt know that her appt time may change.

## 2018-07-24 NOTE — Telephone Encounter (Signed)
Pt has an appt with me 07/25/18.

## 2018-07-24 NOTE — Telephone Encounter (Signed)
12:30 is ok.  Thank you for following up on this.

## 2018-07-25 ENCOUNTER — Ambulatory Visit (INDEPENDENT_AMBULATORY_CARE_PROVIDER_SITE_OTHER): Payer: Medicare Other | Admitting: Internal Medicine

## 2018-07-25 ENCOUNTER — Encounter: Payer: Self-pay | Admitting: Internal Medicine

## 2018-07-25 ENCOUNTER — Telehealth: Payer: Self-pay

## 2018-07-25 ENCOUNTER — Other Ambulatory Visit: Payer: Self-pay

## 2018-07-25 DIAGNOSIS — R195 Other fecal abnormalities: Secondary | ICD-10-CM | POA: Diagnosis not present

## 2018-07-25 DIAGNOSIS — Z20822 Contact with and (suspected) exposure to covid-19: Secondary | ICD-10-CM

## 2018-07-25 DIAGNOSIS — Z20828 Contact with and (suspected) exposure to other viral communicable diseases: Secondary | ICD-10-CM

## 2018-07-25 NOTE — Telephone Encounter (Signed)
Patient called and advised of the referral for covid testing, she verbalized understanding. Appointment scheduled for tomorrow, 07/26/18 at 1000 at [facility], advised of location and to wear a mask for everyone in the vehicle, she verbalized understanding. Order placed.  Referred by PCP

## 2018-07-25 NOTE — Assessment & Plan Note (Signed)
Husband diagnosed with covid.  Other than some loose more frequent stools, she is without acute symptoms.  Eating and drinking well.  Discussed covid testing.  She desires to be tested.  Discussed self quarantine.

## 2018-07-25 NOTE — Progress Notes (Addendum)
Patient ID: Dawn Wolfe, female   DOB: 1945-02-04, 73 y.o.   MRN: [id number]   Virtual Visit via telephone Note  This visit type was conducted due to national recommendations for restrictions regarding the COVID-19 pandemic (e.g. social distancing).  This format is felt to be most appropriate for this patient at this time.  All issues noted in this document were discussed and addressed.  No physical exam was performed (except for noted visual exam findings with Video Visits).   I connected with Dawn Wolfe by telephone and verified that I am speaking with the correct person using two identifiers. Location patient: home Location provider: work or home office Persons participating in the telephone visit: patient, provider  I discussed the limitations, risks, security and privacy concerns of performing an evaluation and management service by telephone and the availability of in person appointments.  The patient expressed understanding and agreed to proceed.   Reason for visit: acute visit  HPI: She reports that her husband was diagnosed with covid yesterday.  He is in the hospital.  States that about 3 weeks ago, he started having some increased body aches.  Saw his podiatrist 07/16/18 with increased foot pain.  Was given a steroid injection and a steroid dose pack.  Noticed increased sob.  Was sleeping in the recliner.  Worsening symptoms yesterday - chest pain.  To ER.  Tested positive.  Wife states she has been doing well.  Denies fever.  No headache, congestion, chest congestion, sob or chest pain.  No abdominal pain.  Eating and drinking ok.  Is having more stools than her normal with some watery stool today.  Request covid testing.    ROS: See pertinent positives and negatives per HPI.  Past Medical History:  Diagnosis Date  . Anemia    h/o as a child  . Arthritis    left shoulder  . Cancer ([place]) 15 yrs ago   [employer] on the face. Surgical removal  . Complication of  anesthesia   . Hemorrhoids   . History of colonoscopy 2003   Normal, Dr. Sammuel Cooper, [place]   . Hypercholesterolemia   . Normal exercise sestamibi stress test 2008   Ken Fath  . Obstructive sleep apnea on CPAP 2007  . PONV (postoperative nausea and vomiting)   . Rotator cuff tear, left   . Sleep apnea     Past Surgical History:  Procedure Laterality Date  . ABDOMINALPLASTY W/ LIPOSUCTION  AGE 4  . BUNIONECTOMY  X3  LAST ONE 2003  . CHOLECYSTECTOMY N/A 04/19/2015   Procedure: LAPAROSCOPIC CHOLECYSTECTOMY WITH INTRAOPERATIVE CHOLANGIOGRAM;  Surgeon: Robert Bellow, MD;  Location: [facility] ORS;  Service: General;  Laterality: N/A;  . COLONOSCOPY  2012   Dr Wyline Mood in [place]  . COLONOSCOPY WITH PROPOFOL N/A 09/11/2016   Procedure: COLONOSCOPY WITH PROPOFOL;  Surgeon: Lollie Sails, MD;  Location: [facility] ENDOSCOPY;  Service: Endoscopy;  Laterality: N/A;  . ERCP N/A 04/29/2015   Procedure: ENDOSCOPIC RETROGRADE CHOLANGIOPANCREATOGRAPHY (ERCP);  Surgeon: Hulen Luster, MD;  Location: [facility] ENDOSCOPY;  Service: Gastroenterology;  Laterality: N/A;  . EYE SURGERY     cataract extraction  . NASAL SEPTUM SURGERY  AGE 22  . NASAL SINUS SURGERY  AGE 30  . SHOULDER ARTHROSCOPY  10/25/2011   Procedure: ARTHROSCOPY SHOULDER;  Surgeon: Magnus Sinning, MD;  Location: [facility];  Service: Orthopedics;  Laterality: Left;  with SAD, shave of the labrium   . [place]  Family History  Problem Relation Age of Onset  . Acute myelogenous leukemia Sister   . Polycythemia Sister 41  . Emphysema Father        smoker  . Heart disease Father        myocardial infarction - 58  . COPD Father   . Heart attack Father   . Breast cancer Paternal Aunt   . Testicular cancer Brother   . Heart disease Mother   . Emphysema Mother   . COPD Mother   . Asthma Mother   . Heart attack Mother   . Leukemia Sister   . Colon cancer Neg Hx     SOCIAL HX: reviewed.     Current  Outpatient Medications:  .  aspirin 81 MG tablet, Take 81 mg by mouth daily. , Disp: , Rfl:  .  fluticasone (FLONASE) 50 MCG/ACT nasal spray, Place 2 sprays into both nostrils as needed., Disp: 16 g, Rfl: 3 .  latanoprost (XALATAN) 0.005 % ophthalmic solution, Place 1 drop into both eyes., Disp: , Rfl: 3 .  pravastatin (PRAVACHOL) 10 MG tablet, TAKE ONE TABLET EVERY DAY, Disp: 90 tablet, Rfl: 1  EXAM:  GENERAL: alert.  Sounds to be in no acute distress. Answering questions appropriately.    PSYCH/NEURO: pleasant and cooperative, no obvious depression or anxiety, speech and thought processing grossly intact  ASSESSMENT AND PLAN:  Discussed the following assessment and plan:  Close Exposure to Covid-19 Virus Husband diagnosed with covid.  Other than some loose more frequent stools, she is without acute symptoms.  Eating and drinking well.  Discussed covid testing.  She desires to be tested.  Discussed self quarantine.    Loose stools More frequent stools and little more loose.  No abdominal pain.  Eating.  Decrease - increased fruit intake.  Probiotic.  Follow.  covid test as outlined.      I discussed the assessment and treatment plan with the patient. The patient was provided an opportunity to ask questions and all were answered. The patient agreed with the plan and demonstrated an understanding of the instructions.   The patient was advised to call back or seek an in-person evaluation if the symptoms worsen or if the condition fails to improve as anticipated.  I provided 15 minutes of non-face-to-face time during this encounter.   Einar Pheasant, MD

## 2018-07-25 NOTE — Assessment & Plan Note (Signed)
More frequent stools and little more loose.  No abdominal pain.  Eating.  Decrease - increased fruit intake.  Probiotic.  Follow.  covid test as outlined.

## 2018-07-26 ENCOUNTER — Other Ambulatory Visit: Payer: Medicare Other

## 2018-07-26 DIAGNOSIS — Z20822 Contact with and (suspected) exposure to covid-19: Secondary | ICD-10-CM

## 2018-07-26 DIAGNOSIS — R6889 Other general symptoms and signs: Secondary | ICD-10-CM | POA: Diagnosis not present

## 2018-08-01 LAB — NOVEL CORONAVIRUS, NAA: SARS-CoV-2, NAA: NOT DETECTED

## 2018-08-23 ENCOUNTER — Other Ambulatory Visit: Payer: Self-pay

## 2018-08-23 ENCOUNTER — Other Ambulatory Visit (INDEPENDENT_AMBULATORY_CARE_PROVIDER_SITE_OTHER): Payer: Medicare Other

## 2018-08-23 ENCOUNTER — Other Ambulatory Visit: Payer: Self-pay | Admitting: Internal Medicine

## 2018-08-23 DIAGNOSIS — R739 Hyperglycemia, unspecified: Secondary | ICD-10-CM

## 2018-08-23 DIAGNOSIS — Z1231 Encounter for screening mammogram for malignant neoplasm of breast: Secondary | ICD-10-CM

## 2018-08-23 DIAGNOSIS — E7800 Pure hypercholesterolemia, unspecified: Secondary | ICD-10-CM | POA: Diagnosis not present

## 2018-08-23 LAB — CBC WITH DIFFERENTIAL/PLATELET
Basophils Absolute: 0 10*3/uL (ref 0.0–0.1)
Basophils Relative: 1 % (ref 0.0–3.0)
Eosinophils Absolute: 0.2 10*3/uL (ref 0.0–0.7)
Eosinophils Relative: 5.6 % — ABNORMAL HIGH (ref 0.0–5.0)
HCT: 37.3 % (ref 36.0–46.0)
Hemoglobin: 12.5 g/dL (ref 12.0–15.0)
Lymphocytes Relative: 36.5 % (ref 12.0–46.0)
Lymphs Abs: 1.5 10*3/uL (ref 0.7–4.0)
MCHC: 33.4 g/dL (ref 30.0–36.0)
MCV: 85.9 fl (ref 78.0–100.0)
Monocytes Absolute: 0.3 10*3/uL (ref 0.1–1.0)
Monocytes Relative: 7 % (ref 3.0–12.0)
Neutro Abs: 2.1 10*3/uL (ref 1.4–7.7)
Neutrophils Relative %: 49.9 % (ref 43.0–77.0)
Platelets: 156 10*3/uL (ref 150.0–400.0)
RBC: 4.34 Mil/uL (ref 3.87–5.11)
RDW: 15 % (ref 11.5–15.5)
WBC: 4.2 10*3/uL (ref 4.0–10.5)

## 2018-08-23 LAB — BASIC METABOLIC PANEL
BUN: 15 mg/dL (ref 6–23)
CO2: 28 mEq/L (ref 19–32)
Calcium: 9.9 mg/dL (ref 8.4–10.5)
Chloride: 103 mEq/L (ref 96–112)
Creatinine, Ser: 0.89 mg/dL (ref 0.40–1.20)
GFR: 62.36 mL/min (ref >60.00)
Glucose, Bld: 113 mg/dL — ABNORMAL HIGH (ref 70–99)
Potassium: 4.2 mEq/L (ref 3.5–5.1)
Sodium: 139 mEq/L (ref 135–145)

## 2018-08-23 LAB — HEMOGLOBIN A1C: Hgb A1c MFr Bld: 6 % (ref 4.6–6.5)

## 2018-08-23 LAB — LIPID PANEL
Cholesterol: 162 mg/dL (ref 0–200)
HDL: 42.2 mg/dL (ref >39.00)
LDL Cholesterol: 93 mg/dL (ref 0–99)
NonHDL: 120.28
Total CHOL/HDL Ratio: 4
Triglycerides: 136 mg/dL (ref 0.0–149.0)
VLDL: 27.2 mg/dL (ref 0.0–40.0)

## 2018-08-23 LAB — HEPATIC FUNCTION PANEL
ALT: 34 U/L (ref 0–35)
AST: 40 U/L — ABNORMAL HIGH (ref 0–37)
Albumin: 4.7 g/dL (ref 3.5–5.2)
Alkaline Phosphatase: 62 U/L (ref 39–117)
Bilirubin, Direct: 0.2 mg/dL (ref 0.0–0.3)
Total Bilirubin: 0.9 mg/dL (ref 0.2–1.2)
Total Protein: 7.4 g/dL (ref 6.0–8.3)

## 2018-08-23 LAB — TSH: TSH: 2.44 u[IU]/mL (ref 0.35–4.50)

## 2018-08-26 ENCOUNTER — Ambulatory Visit (INDEPENDENT_AMBULATORY_CARE_PROVIDER_SITE_OTHER): Payer: Medicare Other | Admitting: Internal Medicine

## 2018-08-26 ENCOUNTER — Encounter: Payer: Self-pay | Admitting: Internal Medicine

## 2018-08-26 ENCOUNTER — Other Ambulatory Visit: Payer: Self-pay

## 2018-08-26 VITALS — BP 124/70 | HR 74 | Temp 98.2°F | Resp 16 | Ht 65.5 in | Wt 150.8 lb

## 2018-08-26 DIAGNOSIS — R739 Hyperglycemia, unspecified: Secondary | ICD-10-CM | POA: Diagnosis not present

## 2018-08-26 DIAGNOSIS — Z8371 Family history of colonic polyps: Secondary | ICD-10-CM | POA: Diagnosis not present

## 2018-08-26 DIAGNOSIS — R748 Abnormal levels of other serum enzymes: Secondary | ICD-10-CM | POA: Diagnosis not present

## 2018-08-26 DIAGNOSIS — G4733 Obstructive sleep apnea (adult) (pediatric): Secondary | ICD-10-CM

## 2018-08-26 DIAGNOSIS — E7800 Pure hypercholesterolemia, unspecified: Secondary | ICD-10-CM | POA: Diagnosis not present

## 2018-08-26 NOTE — Progress Notes (Signed)
Patient ID: Dawn Wolfe, female   DOB: Feb 05, 1945, 73 y.o.   MRN: [id number]   Virtual Visit via virtual Note  This visit type was conducted due to national recommendations for restrictions regarding the COVID-19 pandemic (e.g. social distancing).  This format is felt to be most appropriate for this patient at this time.  All issues noted in this document were discussed and addressed.  No physical exam was performed (except for noted visual exam findings with Video Visits).   I connected with Dawn Wolfe by a video enabled telemedicine application and verified that I am speaking with the correct person using two identifiers. Location patient: home Location provider: work Persons participating in the virtual visit: patient, provider  I discussed the limitations, risks, security and privacy concerns of performing an evaluation and management service by video and the availability of in person appointments. The patient expressed understanding and agreed to proceed.   Reason for visit: scheduled follow up.    HPI: She reports she is doing well.  Stays physically active.  Working around her house and in her yard.  Aggravated her back recently, but his is better.  No pain now.  Eating and drinking well.  No nausea or vomiting.  No chest pain.  No sob.  No acid reflux.  No abdominal pain.  Bowels moving.  Overall feels good.  Husband recently hospitalized with covid.  He is doing well.  Her test was negative.  She never had symptoms.     ROS: See pertinent positives and negatives per HPI.  Past Medical History:  Diagnosis Date  . Anemia    h/o as a child  . Arthritis    left shoulder  . 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 Surgical removal  . Complication of anesthesia   . Hemorrhoids   . History of colonoscopy 2003   Normal, Dr. Sammuel Cooper, [place]   . Hypercholesterolemia   . Normal exercise sestamibi stress test 2008   Ken Fath  . Obstructive sleep apnea on CPAP 2007   . PONV (postoperative nausea and vomiting)   . Rotator cuff tear, left   . Sleep apnea     Past Surgical History:  Procedure Laterality Date  . ABDOMINALPLASTY W/ LIPOSUCTION  AGE 6  . BUNIONECTOMY  X3  LAST ONE 2003  . CHOLECYSTECTOMY N/A 04/19/2015   Procedure: LAPAROSCOPIC CHOLECYSTECTOMY WITH INTRAOPERATIVE CHOLANGIOGRAM;  Surgeon: Robert Bellow, MD;  Location: [facility] ORS;  Service: General;  Laterality: N/A;  . COLONOSCOPY  2012   Dr Wyline Mood in [place]  . COLONOSCOPY WITH PROPOFOL N/A 09/11/2016   Procedure: COLONOSCOPY WITH PROPOFOL;  Surgeon: Lollie Sails, MD;  Location: [facility] ENDOSCOPY;  Service: Endoscopy;  Laterality: N/A;  . ERCP N/A 04/29/2015   Procedure: ENDOSCOPIC RETROGRADE CHOLANGIOPANCREATOGRAPHY (ERCP);  Surgeon: Hulen Luster, MD;  Location: [facility] ENDOSCOPY;  Service: Gastroenterology;  Laterality: N/A;  . EYE SURGERY     cataract extraction  . NASAL SEPTUM SURGERY  AGE 32  . NASAL SINUS SURGERY  AGE 41  . SHOULDER ARTHROSCOPY  10/25/2011   Procedure: ARTHROSCOPY SHOULDER;  Surgeon: Magnus Sinning, MD;  Location: [facility];  Service: Orthopedics;  Laterality: Left;  with SAD, shave of the labrium   . TUBAL LIGATION  1982    Family History  Problem Relation Age of Onset  . Acute myelogenous leukemia Sister   . Polycythemia Sister 20  . Emphysema Father  smoker  . Heart disease Father        myocardial infarction - 61  . COPD Father   . Heart attack Father   . Breast cancer Paternal Aunt   . Testicular cancer Brother   . Heart disease Mother   . Emphysema Mother   . COPD Mother   . Asthma Mother   . Heart attack Mother   . Leukemia Sister   . Colon cancer Neg Hx     SOCIAL HX: reviewed.    Current Outpatient Medications:  .  fluticasone (FLONASE) 50 MCG/ACT nasal spray, Place 2 sprays into both nostrils as needed., Disp: 16 g, Rfl: 3 .  latanoprost (XALATAN) 0.005 % ophthalmic solution, Place 1 drop into both  eyes., Disp: , Rfl: 3 .  pravastatin (PRAVACHOL) 10 MG tablet, TAKE ONE TABLET EVERY DAY, Disp: 90 tablet, Rfl: 1  EXAM:  VITALS per patient if applicable: 119/41, 74, 98.2, 97%  GENERAL: alert, oriented, appears well and in no acute distress  HEENT: atraumatic, conjunttiva clear, no obvious abnormalities on inspection of external nose and ears  NECK: normal movements of the head and neck  LUNGS: on inspection no signs of respiratory distress, breathing rate appears normal, no obvious gross SOB, gasping or wheezing  CV: no obvious cyanosis  PSYCH/NEURO: pleasant and cooperative, no obvious depression or anxiety, speech and thought processing grossly intact  ASSESSMENT AND PLAN:  Discussed the following assessment and plan:  Abnormal liver enzymes Previous abdominal ultrasound revealed no abnormality.  AST 40 last check.  Follow liver panel.  Family history of colonic polyps Had colonoscopy 09/11/16 - sharp turn.  Subsequent ACBE.    Hypercholesteremia On pravastatin.  Low cholesterol diet and exercise.  Follow lipid panel and liver function tests.    Hyperglycemia Low carb diet and exercise.  Follow met b and a1c. Recent a1c 6.0.    Obstructive sleep apnea Continue cpap.      I discussed the assessment and treatment plan with the patient. The patient was provided an opportunity to ask questions and all were answered. The patient agreed with the plan and demonstrated an understanding of the instructions.   The patient was advised to call back or seek an in-person evaluation if the symptoms worsen or if the condition fails to improve as anticipated.   Einar Pheasant, MD

## 2018-08-27 ENCOUNTER — Ambulatory Visit
Admission: RE | Admit: 2018-08-27 | Discharge: 2018-08-27 | Disposition: A | Discharge status: Home / Self Care | Payer: Medicare Other | Source: Ambulatory Visit | Attending: Internal Medicine | Admitting: Internal Medicine

## 2018-08-27 DIAGNOSIS — Z1231 Encounter for screening mammogram for malignant neoplasm of breast: Secondary | ICD-10-CM | POA: Diagnosis not present

## 2018-08-31 ENCOUNTER — Encounter: Payer: Self-pay | Admitting: Internal Medicine

## 2018-08-31 NOTE — Assessment & Plan Note (Signed)
Continue cpap.  

## 2018-08-31 NOTE — Assessment & Plan Note (Signed)
Previous abdominal ultrasound revealed no abnormality.  AST 40 last check.  Follow liver panel.

## 2018-08-31 NOTE — Assessment & Plan Note (Signed)
On pravastatin.  Low cholesterol diet and exercise.  Follow lipid panel and liver function tests.   

## 2018-08-31 NOTE — Assessment & Plan Note (Signed)
Had colonoscopy 09/11/16 - sharp turn.  Subsequent ACBE.

## 2018-08-31 NOTE — Assessment & Plan Note (Addendum)
Low carb diet and exercise.  Follow met b and a1c. Recent a1c 6.0.   

## 2018-09-26 ENCOUNTER — Other Ambulatory Visit (INDEPENDENT_AMBULATORY_CARE_PROVIDER_SITE_OTHER): Payer: Medicare Other

## 2018-09-26 ENCOUNTER — Other Ambulatory Visit: Payer: Self-pay

## 2018-09-26 DIAGNOSIS — R748 Abnormal levels of other serum enzymes: Secondary | ICD-10-CM

## 2018-09-26 LAB — HEPATIC FUNCTION PANEL
ALT: 36 U/L — ABNORMAL HIGH (ref 0–35)
AST: 48 U/L — ABNORMAL HIGH (ref 0–37)
Albumin: 4.4 g/dL (ref 3.5–5.2)
Alkaline Phosphatase: 63 U/L (ref 39–117)
Bilirubin, Direct: 0.1 mg/dL (ref 0.0–0.3)
Total Bilirubin: 0.8 mg/dL (ref 0.2–1.2)
Total Protein: 7.4 g/dL (ref 6.0–8.3)

## 2018-09-30 ENCOUNTER — Other Ambulatory Visit: Payer: Self-pay | Admitting: Internal Medicine

## 2018-09-30 DIAGNOSIS — R7989 Other specified abnormal findings of blood chemistry: Secondary | ICD-10-CM

## 2018-09-30 DIAGNOSIS — R945 Abnormal results of liver function studies: Secondary | ICD-10-CM

## 2018-09-30 NOTE — Progress Notes (Signed)
Order placed for abdominal ultrasound.   

## 2018-10-10 ENCOUNTER — Ambulatory Visit: Payer: Self-pay

## 2018-10-10 DIAGNOSIS — H5310 Unspecified subjective visual disturbances: Secondary | ICD-10-CM | POA: Diagnosis not present

## 2018-10-10 NOTE — Telephone Encounter (Signed)
Pt is going to hold off on appt. She says she is doing ok. Offered visit mulitple times, pt declined. Confirmed she would go to ed or urgent care if symptoms return.

## 2018-10-10 NOTE — Telephone Encounter (Signed)
Pt. Reports she was driving 1-2 hours ago and experienced 1-2 minutes of double vision. Pulled over so her husband could drive. Is in the eye doctor's office now - Dr. Gloriann Loan. No vision issues now. No headache or dizziness. Spoke with Butch Penny in the practice and will send triage note over for review. Dr. Gloriann Loan recommends labs as well - C-reactive protein and sedrate. Please advise pt.  Answer Assessment - Initial Assessment Questions 1. DESCRIPTION: "What is the vision loss like? Describe it for me." (e.g., complete vision loss, blurred vision, double vision, floaters, etc.)     Had double vision x 1-2 minutes 2. LOCATION: "One or both eyes?" If one, ask: "Which eye?"     Both eyes 3. SEVERITY: "Can you see anything?" If so, ask: "What can you see?" (e.g., fine print)     Stopped now 4. ONSET: "When did this begin?" "Did it start suddenly or has this been gradual?"     Happened 1-2 hours ago 5. PATTERN: "Does this come and go, or has it been constant since it started?"     Stopped now 6. PAIN: "Is there any pain in your eye(s)?"  (Scale 1-10; or mild, moderate, severe)     No 7. CONTACTS-GLASSES: "Do you wear contacts or glasses?"     Glasses 8. CAUSE: "What do you think is causing this visual problem?"     Unsure 9. OTHER SYMPTOMS: "Do you have any other symptoms?" (e.g., confusion, headache, arm or leg weakness, speech problems)     No 10. PREGNANCY: "Is there any chance you are pregnant?" "When was your last menstrual period?"       No  Protocols used: [place]

## 2018-10-14 DIAGNOSIS — H40153 Residual stage of open-angle glaucoma, bilateral: Secondary | ICD-10-CM | POA: Diagnosis not present

## 2018-10-15 ENCOUNTER — Other Ambulatory Visit: Payer: Self-pay

## 2018-10-15 ENCOUNTER — Encounter: Payer: Self-pay | Admitting: Internal Medicine

## 2018-10-15 ENCOUNTER — Ambulatory Visit
Admission: RE | Admit: 2018-10-15 | Discharge: 2018-10-15 | Disposition: A | Discharge status: Home / Self Care | Payer: Medicare Other | Source: Ambulatory Visit | Attending: Internal Medicine | Admitting: Internal Medicine

## 2018-10-15 DIAGNOSIS — R945 Abnormal results of liver function studies: Secondary | ICD-10-CM | POA: Insufficient documentation

## 2018-10-15 DIAGNOSIS — R7989 Other specified abnormal findings of blood chemistry: Secondary | ICD-10-CM

## 2018-10-15 DIAGNOSIS — K760 Fatty (change of) liver, not elsewhere classified: Secondary | ICD-10-CM | POA: Diagnosis not present

## 2018-10-15 DIAGNOSIS — J301 Allergic rhinitis due to pollen: Secondary | ICD-10-CM | POA: Diagnosis not present

## 2018-10-15 NOTE — Telephone Encounter (Signed)
I am ok with obtaining the labs, carotid ultrasound.  Also, see if we can get a copy of Dr Purvis Sheffield records.  Can schedule a doxy with me to discuss and plan for further w/up and evaluation.  Confirm doing ok.  Need specifics about her double vision.

## 2018-10-16 ENCOUNTER — Other Ambulatory Visit: Payer: Self-pay | Admitting: Internal Medicine

## 2018-10-16 DIAGNOSIS — R7989 Other specified abnormal findings of blood chemistry: Secondary | ICD-10-CM

## 2018-10-16 DIAGNOSIS — R945 Abnormal results of liver function studies: Secondary | ICD-10-CM

## 2018-10-16 NOTE — Progress Notes (Signed)
Order placed for f/u lab.   

## 2018-10-16 NOTE — Telephone Encounter (Signed)
Pt scheduled for virtual tomorrow. Requested records. Confirmed doing ok. No acute symptoms at this time.

## 2018-10-17 ENCOUNTER — Telehealth: Payer: Self-pay

## 2018-10-17 ENCOUNTER — Ambulatory Visit (INDEPENDENT_AMBULATORY_CARE_PROVIDER_SITE_OTHER): Payer: Medicare Other | Admitting: Internal Medicine

## 2018-10-17 ENCOUNTER — Other Ambulatory Visit: Payer: Self-pay

## 2018-10-17 VITALS — BP 128/80 | HR 88 | Ht 66.0 in | Wt 145.0 lb

## 2018-10-17 DIAGNOSIS — R739 Hyperglycemia, unspecified: Secondary | ICD-10-CM

## 2018-10-17 DIAGNOSIS — E7800 Pure hypercholesterolemia, unspecified: Secondary | ICD-10-CM

## 2018-10-17 DIAGNOSIS — R748 Abnormal levels of other serum enzymes: Secondary | ICD-10-CM | POA: Diagnosis not present

## 2018-10-17 DIAGNOSIS — H539 Unspecified visual disturbance: Secondary | ICD-10-CM | POA: Diagnosis not present

## 2018-10-17 DIAGNOSIS — H532 Diplopia: Secondary | ICD-10-CM

## 2018-10-17 NOTE — Telephone Encounter (Signed)
Called pt to schedule nonfasting lab appt for tomorrow. Dr Nicki Reaper would like done before next week. Left message for pt to call back

## 2018-10-17 NOTE — Progress Notes (Signed)
Patient ID: Dawn Wolfe, female   DOB: 01/18/1945, 73 y.o.   MRN: [id number]   Virtual Visit via video Note  This visit type was conducted due to national recommendations for restrictions regarding the COVID-19 pandemic (e.g. social distancing).  This format is felt to be most appropriate for this patient at this time.  All issues noted in this document were discussed and addressed.  No physical exam was performed (except for noted visual exam findings with Video Visits).   I connected with Dawn Wolfe by a video enabled telemedicine application and verified that I am speaking with the correct person using two identifiers. Location patient: home Location provider: work Persons participating in the virtual visit: patient, provider  I discussed the limitations, risks, security and privacy concerns of performing an evaluation and management service by video and the availability of in person appointments. The patient expressed understanding and agreed to proceed.   Reason for visit: work in appt  HPI: Pt states she was driving back from [facility] 10/10/18 and experienced double vision.  Described noticed something "like a film" and double vision.  She pulled over when she noticed the change in her vision.  No headache.  No dizziness.  No other neurological symptoms.  Her husband was with her.  He reported no confusion, etc.  The episode lasted only 1.5-2 minutes.  She went to her eye doctor.  States eyes checked out fine.  Requested labs.  She states since that episode, she has not noticed any further vision change.  No headache.  No dizziness.  No speech or swallowing change.  No chest pain.  No sob.  No acid reflux.  No abdominal pain.  Bowels moving.  Has lost some weight.     ROS: See pertinent positives and negatives per HPI.  Past Medical History:  Diagnosis Date  . Anemia    h/o as a child  . Arthritis    left shoulder  . 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  Surgical removal  . Complication of anesthesia   . Hemorrhoids   . History of colonoscopy 2003   Normal, Dr. Sammuel Cooper, [place]   . Hypercholesterolemia   . Normal exercise sestamibi stress test 2008   Ken Fath  . Obstructive sleep apnea on CPAP 2007  . PONV (postoperative nausea and vomiting)   . Rotator cuff tear, left   . Sleep apnea     Past Surgical History:  Procedure Laterality Date  . ABDOMINALPLASTY W/ LIPOSUCTION  AGE 38  . BUNIONECTOMY  X3  LAST ONE 2003  . CHOLECYSTECTOMY N/A 04/19/2015   Procedure: LAPAROSCOPIC CHOLECYSTECTOMY WITH INTRAOPERATIVE CHOLANGIOGRAM;  Surgeon: Robert Bellow, MD;  Location: [facility] ORS;  Service: General;  Laterality: N/A;  . COLONOSCOPY  2012   Dr Wyline Mood in [place]  . COLONOSCOPY WITH PROPOFOL N/A 09/11/2016   Procedure: COLONOSCOPY WITH PROPOFOL;  Surgeon: Lollie Sails, MD;  Location[facility]COPY;  Service: Endoscopy;  Laterality: N/A;  . ERCP N/A 04/29/2015   Procedure: ENDOSCOPIC RETROGRADE CHOLANGIOPANCREATOGRAPHY (ERCP);  Surgeon: Hulen Luster, MD;  Location: [facility] ENDOSCOPY;  Service: Gastroenterology;  Laterality: N/A;  . EYE SURGERY     cataract extraction  . NASAL SEPTUM SURGERY  AGE 28  . NASAL SINUS SURGERY  AGE 43  . SHOULDER ARTHROSCOPY  10/25/2011   Procedure: ARTHROSCOPY SHOULDER;  Surgeon: Magnus Sinning, MD;  Location: [facility];  Service: Orthopedics;  Laterality: Left;  with  SAD, shave of the labrium   . TUBAL LIGATION  1982    Family History  Problem Relation Age of Onset  . Acute myelogenous leukemia Sister   . Polycythemia Sister 77  . Emphysema Father        smoker  . Heart disease Father        myocardial infarction - 36  . COPD Father   . Heart attack Father   . Breast cancer Paternal Aunt   . Testicular cancer Brother   . Heart disease Mother   . Emphysema Mother   . COPD Mother   . Asthma Mother   . Heart attack Mother   . Leukemia Sister   . Colon cancer Neg Hx      SOCIAL HX: reviewed.    Current Outpatient Medications:  .  fluticasone (FLONASE) 50 MCG/ACT nasal spray, Place 2 sprays into both nostrils as needed., Disp: 16 g, Rfl: 3 .  latanoprost (XALATAN) 0.005 % ophthalmic solution, Place 1 drop into both eyes., Disp: , Rfl: 3 .  pravastatin (PRAVACHOL) 10 MG tablet, TAKE ONE TABLET EVERY DAY, Disp: 90 tablet, Rfl: 1  EXAM:  VITALS per patient if applicable: 615/18, 88  GENERAL: alert, oriented, appears well and in no acute distress  HEENT: atraumatic, conjunttiva clear, no obvious abnormalities on inspection of external nose and ears  NECK: normal movements of the head and neck  LUNGS: on inspection no signs of respiratory distress, breathing rate appears normal, no obvious gross SOB, gasping or wheezing  CV: no obvious cyanosis  PSYCH/NEURO: pleasant and cooperative, no obvious depression or anxiety, speech and thought processing grossly intact  ASSESSMENT AND PLAN:  Discussed the following assessment and plan:  Abnormal liver enzymes Slight elevation.  Will plan for f/u.    Hypercholesteremia Continue pravastatin.  Follow lipid panel and liver function tests.    Hyperglycemia Low carb diet and exercise.  Follow met b and a1c.   Double vision Had the brief episode of double vision as outlined.  No recurring problems or symptoms and no associated symptoms.  She did start back on her aspirin.  Taking daily now.  Saw her optometrist.  Eyes ok.  Check esr and crp.  Check carotid ultrasound.  Have neurology evaluate.  Discussed the need for immediate evaluation if symptoms return.      I discussed the assessment and treatment plan with the patient. The patient was provided an opportunity to ask questions and all were answered. The patient agreed with the plan and demonstrated an understanding of the instructions.   The patient was advised to call back or seek an in-person evaluation if the symptoms worsen or if the condition fails to  improve as anticipated.   Dawn Pheasant, MD

## 2018-10-17 NOTE — Addendum Note (Signed)
Addended by: Leeanne Rio on: 10/17/2018 04:42 PM   Modules accepted: Orders

## 2018-10-18 ENCOUNTER — Other Ambulatory Visit: Payer: Self-pay

## 2018-10-18 ENCOUNTER — Other Ambulatory Visit (INDEPENDENT_AMBULATORY_CARE_PROVIDER_SITE_OTHER): Payer: Medicare Other

## 2018-10-18 DIAGNOSIS — R945 Abnormal results of liver function studies: Secondary | ICD-10-CM

## 2018-10-18 DIAGNOSIS — H539 Unspecified visual disturbance: Secondary | ICD-10-CM

## 2018-10-18 DIAGNOSIS — R7989 Other specified abnormal findings of blood chemistry: Secondary | ICD-10-CM

## 2018-10-19 ENCOUNTER — Encounter: Payer: Self-pay | Admitting: Internal Medicine

## 2018-10-19 DIAGNOSIS — H532 Diplopia: Secondary | ICD-10-CM | POA: Insufficient documentation

## 2018-10-19 LAB — SEDIMENTATION RATE: Sed Rate: 6 mm/h (ref 0–30)

## 2018-10-19 LAB — C-REACTIVE PROTEIN: CRP: 3.6 mg/L (ref <8.0)

## 2018-10-19 LAB — HEPATIC FUNCTION PANEL
AG Ratio: 1.5 (calc) (ref 1.0–2.5)
ALT: 39 U/L — ABNORMAL HIGH (ref 6–29)
AST: 46 U/L — ABNORMAL HIGH (ref 10–35)
Albumin: 4.7 g/dL (ref 3.6–5.1)
Alkaline phosphatase (APISO): 62 U/L (ref 37–153)
Bilirubin, Direct: 0.1 mg/dL (ref 0.0–0.2)
Globulin: 3.1 g/dL (calc) (ref 1.9–3.7)
Indirect Bilirubin: 0.4 mg/dL (calc) (ref 0.2–1.2)
Total Bilirubin: 0.5 mg/dL (ref 0.2–1.2)
Total Protein: 7.8 g/dL (ref 6.1–8.1)

## 2018-10-19 NOTE — Assessment & Plan Note (Signed)
Had the brief episode of double vision as outlined.  No recurring problems or symptoms and no associated symptoms.  She did start back on her aspirin.  Taking daily now.  Saw her optometrist.  Eyes ok.  Check esr and crp.  Check carotid ultrasound.  Have neurology evaluate.  Discussed the need for immediate evaluation if symptoms return.

## 2018-10-19 NOTE — Assessment & Plan Note (Signed)
Slight elevation.  Will plan for f/u.

## 2018-10-19 NOTE — Assessment & Plan Note (Signed)
Continue pravastatin. Follow lipid panel and liver function tests.  

## 2018-10-19 NOTE — Assessment & Plan Note (Signed)
Low carb diet and exercise.  Follow met b and a1c.  

## 2018-10-25 ENCOUNTER — Ambulatory Visit: Payer: Medicare Other

## 2018-11-11 ENCOUNTER — Other Ambulatory Visit: Payer: Self-pay

## 2018-11-11 ENCOUNTER — Telehealth: Payer: Self-pay | Admitting: *Deleted

## 2018-11-11 ENCOUNTER — Other Ambulatory Visit (INDEPENDENT_AMBULATORY_CARE_PROVIDER_SITE_OTHER): Payer: Medicare Other

## 2018-11-11 DIAGNOSIS — R945 Abnormal results of liver function studies: Secondary | ICD-10-CM

## 2018-11-11 DIAGNOSIS — R7989 Other specified abnormal findings of blood chemistry: Secondary | ICD-10-CM

## 2018-11-11 LAB — HEPATIC FUNCTION PANEL
ALT: 27 U/L (ref 0–35)
AST: 35 U/L (ref 0–37)
Albumin: 4.6 g/dL (ref 3.5–5.2)
Alkaline Phosphatase: 65 U/L (ref 39–117)
Bilirubin, Direct: 0.1 mg/dL (ref 0.0–0.3)
Total Bilirubin: 0.6 mg/dL (ref 0.2–1.2)
Total Protein: 7.4 g/dL (ref 6.0–8.3)

## 2018-11-11 NOTE — Telephone Encounter (Signed)
Please place future orders for lab appt.   Pt has lab appt today

## 2018-11-11 NOTE — Telephone Encounter (Signed)
Order placed for f/u liver panel.  

## 2018-11-12 ENCOUNTER — Encounter: Payer: Self-pay | Admitting: Internal Medicine

## 2018-11-21 DIAGNOSIS — D2261 Melanocytic nevi of right upper limb, including shoulder: Secondary | ICD-10-CM | POA: Diagnosis not present

## 2018-11-21 DIAGNOSIS — D225 Melanocytic nevi of trunk: Secondary | ICD-10-CM | POA: Diagnosis not present

## 2018-11-21 DIAGNOSIS — Z85828 Personal history of other malignant neoplasm of skin: Secondary | ICD-10-CM | POA: Diagnosis not present

## 2018-11-21 DIAGNOSIS — L820 Inflamed seborrheic keratosis: Secondary | ICD-10-CM | POA: Diagnosis not present

## 2018-11-21 DIAGNOSIS — D2262 Melanocytic nevi of left upper limb, including shoulder: Secondary | ICD-10-CM | POA: Diagnosis not present

## 2019-01-03 ENCOUNTER — Other Ambulatory Visit: Payer: Self-pay | Admitting: Internal Medicine

## 2019-01-07 DIAGNOSIS — H532 Diplopia: Secondary | ICD-10-CM | POA: Diagnosis not present

## 2019-01-08 DIAGNOSIS — G4733 Obstructive sleep apnea (adult) (pediatric): Secondary | ICD-10-CM | POA: Diagnosis not present

## 2019-01-22 DIAGNOSIS — H532 Diplopia: Secondary | ICD-10-CM | POA: Diagnosis not present

## 2019-01-27 DIAGNOSIS — H40153 Residual stage of open-angle glaucoma, bilateral: Secondary | ICD-10-CM | POA: Diagnosis not present

## 2019-02-24 ENCOUNTER — Telehealth: Payer: Self-pay | Admitting: *Deleted

## 2019-02-24 ENCOUNTER — Other Ambulatory Visit: Payer: Self-pay

## 2019-02-24 DIAGNOSIS — R748 Abnormal levels of other serum enzymes: Secondary | ICD-10-CM

## 2019-02-24 DIAGNOSIS — E7800 Pure hypercholesterolemia, unspecified: Secondary | ICD-10-CM

## 2019-02-24 DIAGNOSIS — R739 Hyperglycemia, unspecified: Secondary | ICD-10-CM

## 2019-02-24 NOTE — Telephone Encounter (Signed)
Order placed for labs.

## 2019-02-24 NOTE — Telephone Encounter (Signed)
Please place future orders for lab appt.  

## 2019-02-26 ENCOUNTER — Other Ambulatory Visit (INDEPENDENT_AMBULATORY_CARE_PROVIDER_SITE_OTHER): Payer: Medicare Other

## 2019-02-26 ENCOUNTER — Other Ambulatory Visit: Payer: Self-pay

## 2019-02-26 DIAGNOSIS — R739 Hyperglycemia, unspecified: Secondary | ICD-10-CM

## 2019-02-26 DIAGNOSIS — E7800 Pure hypercholesterolemia, unspecified: Secondary | ICD-10-CM | POA: Diagnosis not present

## 2019-02-26 LAB — BASIC METABOLIC PANEL
BUN: 16 mg/dL (ref 6–23)
CO2: 29 mEq/L (ref 19–32)
Calcium: 9.7 mg/dL (ref 8.4–10.5)
Chloride: 103 mEq/L (ref 96–112)
Creatinine, Ser: 0.94 mg/dL (ref 0.40–1.20)
GFR: 58.47 mL/min — ABNORMAL LOW (ref >60.00)
Glucose, Bld: 100 mg/dL — ABNORMAL HIGH (ref 70–99)
Potassium: 3.9 mEq/L (ref 3.5–5.1)
Sodium: 139 mEq/L (ref 135–145)

## 2019-02-26 LAB — LIPID PANEL
Cholesterol: 136 mg/dL (ref 0–200)
HDL: 49.3 mg/dL (ref >39.00)
LDL Cholesterol: 71 mg/dL (ref 0–99)
NonHDL: 86.46
Total CHOL/HDL Ratio: 3
Triglycerides: 78 mg/dL (ref 0.0–149.0)
VLDL: 15.6 mg/dL (ref 0.0–40.0)

## 2019-02-26 LAB — HEPATIC FUNCTION PANEL
ALT: 22 U/L (ref 0–35)
AST: 28 U/L (ref 0–37)
Albumin: 4.4 g/dL (ref 3.5–5.2)
Alkaline Phosphatase: 59 U/L (ref 39–117)
Bilirubin, Direct: 0.2 mg/dL (ref 0.0–0.3)
Total Bilirubin: 1 mg/dL (ref 0.2–1.2)
Total Protein: 7.4 g/dL (ref 6.0–8.3)

## 2019-02-26 LAB — HEMOGLOBIN A1C: Hgb A1c MFr Bld: 5.7 % (ref 4.6–6.5)

## 2019-02-28 ENCOUNTER — Encounter: Payer: Self-pay | Admitting: Internal Medicine

## 2019-03-02 ENCOUNTER — Ambulatory Visit: Payer: Medicare Other | Attending: Internal Medicine

## 2019-03-02 DIAGNOSIS — Z23 Encounter for immunization: Secondary | ICD-10-CM

## 2019-03-02 NOTE — Progress Notes (Signed)
   Covid-19 Vaccination Clinic  Name:  Dawn Wolfe    MRN: [id number] DOB: 14-Mar-1945  03/02/2019  [name] was observed post Covid-19 immunization for 15 minutes without incidence. She was provided with Vaccine Information Sheet and instruction to access the V-Safe system.   [name] was instructed to call 911 with any severe reactions post vaccine: Marland Kitchen Difficulty breathing  . Swelling of your face and throat  . A fast heartbeat  . A bad rash all over your body  . Dizziness and weakness    Immunizations Administered    Name Date Dose VIS Date Route   Pfizer COVID-19 Vaccine 03/02/2019 10:06 AM 0.3 mL 12/27/2018 Intramuscular   Manufacturer: [place]   Lot: [id number]   [place]: [id number]

## 2019-03-03 ENCOUNTER — Other Ambulatory Visit: Payer: Self-pay

## 2019-03-03 ENCOUNTER — Ambulatory Visit (INDEPENDENT_AMBULATORY_CARE_PROVIDER_SITE_OTHER): Payer: Medicare Other | Admitting: Internal Medicine

## 2019-03-03 VITALS — BP 118/70 | HR 93 | Temp 96.7°F | Resp 16 | Ht 66.0 in | Wt 140.4 lb

## 2019-03-03 DIAGNOSIS — N281 Cyst of kidney, acquired: Secondary | ICD-10-CM

## 2019-03-03 DIAGNOSIS — Z0000 Encounter for general adult medical examination without abnormal findings: Secondary | ICD-10-CM | POA: Diagnosis not present

## 2019-03-03 DIAGNOSIS — R739 Hyperglycemia, unspecified: Secondary | ICD-10-CM

## 2019-03-03 DIAGNOSIS — R748 Abnormal levels of other serum enzymes: Secondary | ICD-10-CM | POA: Diagnosis not present

## 2019-03-03 DIAGNOSIS — H532 Diplopia: Secondary | ICD-10-CM | POA: Diagnosis not present

## 2019-03-03 DIAGNOSIS — E7800 Pure hypercholesterolemia, unspecified: Secondary | ICD-10-CM | POA: Diagnosis not present

## 2019-03-03 DIAGNOSIS — G4733 Obstructive sleep apnea (adult) (pediatric): Secondary | ICD-10-CM

## 2019-03-03 NOTE — Progress Notes (Signed)
Patient ID: Dawn Wolfe, female   DOB: Oct 12, 1945, 74 y.o.   MRN: [id number]   Subjective:    Patient ID: Dawn Wolfe, female    DOB: 17-May-1945, 74 y.o.   MRN: [id number]  HPI This visit occurred during the SARS-CoV-2 public health emergency.  Safety protocols were in place, including screening questions prior to the visit, additional usage of staff PPE, and extensive cleaning of exam room while observing appropriate contact time as indicated for disinfecting solutions.  Patient here for her physical exam.  She reports she is doing well.  Feels good. Stays active.  Has adjusted her diet.  Has lost weight.  No chest pain or sob with increased activity or exertion.  No abdominal pain or bowel change reported.  Recently worked up for double vision.  Saw neurology - Dr Manuella Ghazi.  Had MRI/MRA.  Per report from phone message - negative MRI brain with some blood vessel irregularities.  Plans to follow up at next visit.  Continues on daily aspirin.  No further vision changes.  No headache.   Recently had abdominal ultrasound to f/u abnormal liver function tests.  Found to have fatty infiltration of the liver.  Commented on left renal cyst - 4.1cm cyst.  Commented - stable.  Discussed referral to urology given 4 cm cyst - to determine need for f/u.  Overall feels good.     Past Medical History:  Diagnosis Date  . Anemia    h/o as a child  . Arthritis    left shoulder  . 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 Surgical removal  . Complication of anesthesia   . Hemorrhoids   . History of colonoscopy 2003   Normal, Dr. Sammuel Cooper, [place]   . Hypercholesterolemia   . Normal exercise sestamibi stress test 2008   Ken Fath  . Obstructive sleep apnea on CPAP 2007  . PONV (postoperative nausea and vomiting)   . Rotator cuff tear, left   . Sleep apnea    Past Surgical History:  Procedure Laterality Date  . ABDOMINALPLASTY W/ LIPOSUCTION  AGE 59  . BUNIONECTOMY  X3  LAST ONE 2003  .  CHOLECYSTECTOMY N/A 04/19/2015   Procedure: LAPAROSCOPIC CHOLECYSTECTOMY WITH INTRAOPERATIVE CHOLANGIOGRAM;  Surgeon: Robert Bellow, MD;  Location: [facility] ORS;  Service: General;  Laterality: N/A;  . COLONOSCOPY  2012   Dr Wyline Mood in [place]  . COLONOSCOPY WITH PROPOFOL N/A 09/11/2016   Procedure: COLONOSCOPY WITH PROPOFOL;  Surgeon: Lollie Sails, MD;  Location[facility] ENDOSCOPY;  Service: Endoscopy;  Laterality: N/A;  . ERCP N/A 04/29/2015   Procedure: ENDOSCOPIC RETROGRADE CHOLANGIOPANCREATOGRAPHY (ERCP);  Surgeon: Hulen Luster, MD;  Location: [facility] ENDOSCOPY;  Service: Gastroenterology;  Laterality: N/A;  . EYE SURGERY     cataract extraction  . NASAL SEPTUM SURGERY  AGE 33  . NASAL SINUS SURGERY  AGE 64  . SHOULDER ARTHROSCOPY  10/25/2011   Procedure: ARTHROSCOPY SHOULDER;  Surgeon: Magnus Sinning, MD;  Location: [facility];  Service: Orthopedics;  Laterality: Left;  with SAD, shave of the labrium   . TUBAL LIGATION  1982   Family History  Problem Relation Age of Onset  . Acute myelogenous leukemia Sister   . Polycythemia Sister 80  . Emphysema Father        smoker  . Heart disease Father        myocardial infarction - 77  . COPD Father   . Heart attack  Father   . Breast cancer Paternal Aunt   . Testicular cancer Brother   . Heart disease Mother   . Emphysema Mother   . COPD Mother   . Asthma Mother   . Heart attack Mother   . Leukemia Sister   . Colon cancer Neg Hx    Social History   Socioeconomic History  . Marital status: Married    Spouse name: Not on file  . Number of children: Not on file  . Years of education: Not on file  . Highest education level: Not on file  Occupational History  . Not on file  Tobacco Use  . Smoking status: Former Smoker    Packs/day: 2.50    Years: 10.00    Pack years: 25.00    Types: Cigarettes    Quit date: 03/28/1977    Years since quitting: 41.9  . Smokeless tobacco: Never Used  Substance and Sexual  Activity  . Alcohol use: No    Alcohol/week: 0.0 standard drinks  . Drug use: No  . Sexual activity: Never  Other Topics Concern  . Not on file  Social History Narrative  . Not on file   Social Determinants of Health   Financial Resource Strain: Low Risk   . Difficulty of Paying Living Expenses: Not hard at all  Food Insecurity: No Food Insecurity  . Worried About Charity fundraiser in the Last Year: Never true  . Ran Out of Food in the Last Year: Never true  Transportation Needs: No Transportation Needs  . Lack of Transportation (Medical): No  . Lack of Transportation (Non-Medical): No  Physical Activity:   . Days of Exercise per Week: Not on file  . Minutes of Exercise per Session: Not on file  Stress:   . Feeling of Stress : Not on file  Social Connections:   . Frequency of Communication with Friends and Family: Not on file  . Frequency of Social Gatherings with Friends and Family: Not on file  . Attends Religious Services: Not on file  . Active Member of Clubs or Organizations: Not on file  . Attends Archivist Meetings: Not on file  . Marital Status: Not on file    Outpatient Encounter Medications as of 03/03/2019  Medication Sig  . aspirin EC 81 MG tablet Take 81 mg by mouth daily.  . fluticasone (FLONASE) 50 MCG/ACT nasal spray Place 2 sprays into both nostrils as needed.  . latanoprost (XALATAN) 0.005 % ophthalmic solution Place 1 drop into both eyes.  . pravastatin (PRAVACHOL) 10 MG tablet TAKE 1 TABLET BY MOUTH DAILY  . saccharomyces boulardii (FLORASTOR) 250 MG capsule Take by mouth.   No facility-administered encounter medications on file as of 03/03/2019.   Review of Systems  Constitutional: Negative for appetite change and unexpected weight change.  HENT: Negative for congestion and sinus pressure.   Eyes: Negative for pain and visual disturbance.  Respiratory: Negative for cough, chest tightness and shortness of breath.   Cardiovascular:  Negative for chest pain, palpitations and leg swelling.  Gastrointestinal: Negative for abdominal pain, diarrhea, nausea and vomiting.  Genitourinary: Negative for difficulty urinating and dysuria.  Musculoskeletal: Negative for joint swelling and myalgias.  Skin: Negative for color change and rash.  Neurological: Negative for dizziness, light-headedness and headaches.  Hematological: Negative for adenopathy. Does not bruise/bleed easily.  Psychiatric/Behavioral: Negative for agitation and dysphoric mood.       Objective:    Physical Exam Constitutional:  General: She is not in acute distress.    Appearance: Normal appearance. She is well-developed.  HENT:     Head: Normocephalic and atraumatic.     Right Ear: External ear normal.     Left Ear: External ear normal.  Eyes:     General: No scleral icterus.       Right eye: No discharge.        Left eye: No discharge.     Conjunctiva/sclera: Conjunctivae normal.  Neck:     Thyroid: No thyromegaly.  Cardiovascular:     Rate and Rhythm: Normal rate and regular rhythm.  Pulmonary:     Effort: No tachypnea, accessory muscle usage or respiratory distress.     Breath sounds: Normal breath sounds. No decreased breath sounds or wheezing.  Chest:     Breasts:        Right: No inverted nipple, mass, nipple discharge or tenderness (no axillary adenopathy).        Left: No inverted nipple, mass, nipple discharge or tenderness (no axilarry adenopathy).  Abdominal:     General: Bowel sounds are normal.     Palpations: Abdomen is soft.     Tenderness: There is no abdominal tenderness.  Musculoskeletal:        General: No swelling or tenderness.     Cervical back: Neck supple. No tenderness.  Lymphadenopathy:     Cervical: No cervical adenopathy.  Skin:    Findings: No erythema or rash.  Neurological:     Mental Status: She is alert and oriented to person, place, and time.  Psychiatric:        Mood and Affect: Mood normal.         Behavior: Behavior normal.     BP 118/70   Pulse 93   Temp (!) 96.7 F (35.9 C)   Resp 16   Ht 5' 6"  (1.676 m)   Wt 140 lb 6.4 oz (63.7 kg)   SpO2 97%   BMI 22.66 kg/m  Wt Readings from Last 3 Encounters:  03/03/19 140 lb 6.4 oz (63.7 kg)  10/17/18 145 lb (65.8 kg)  08/26/18 150 lb 12.8 oz (68.4 kg)     Lab Results  Component Value Date   WBC 4.2 08/23/2018   HGB 12.5 08/23/2018   HCT 37.3 08/23/2018   PLT 156.0 08/23/2018   GLUCOSE 100 (H) 02/26/2019   CHOL 136 02/26/2019   TRIG 78.0 02/26/2019   HDL 49.30 02/26/2019   LDLDIRECT 88.0 07/22/2014   LDLCALC 71 02/26/2019   ALT 22 02/26/2019   AST 28 02/26/2019   NA 139 02/26/2019   K 3.9 02/26/2019   CL 103 02/26/2019   CREATININE 0.94 02/26/2019   BUN 16 02/26/2019   CO2 29 02/26/2019   TSH 2.44 08/23/2018   HGBA1C 5.7 02/26/2019    US Abdomen Complete  Result Date: 10/15/2018 CLINICAL DATA:  Abnormal LFTs EXAM: ABDOMEN ULTRASOUND COMPLETE COMPARISON:  12/05/2011 FINDINGS: Gallbladder: Surgically removed Common bile duct: Diameter: 5 mm Liver: Diffusely increased in echogenicity consistent with fatty infiltration. Portal vein is patent on color Doppler imaging with normal direction of blood flow towards the liver. IVC: No abnormality visualized. Pancreas: Visualized portion unremarkable. Spleen: Size and appearance within normal limits. Right Kidney: Length: 10.8 cm. Echogenicity within normal limits. No mass or hydronephrosis visualized. Left Kidney: Length: 11.0 cm. 4.1 cm cyst is noted in the upper pole of the left kidney. This is stable from a prior exam. No obstructive changes are noted.  Abdominal aorta: No aneurysm visualized. Other findings: None. IMPRESSION: Stable left renal cyst. Status post cholecystectomy. Fatty infiltration of the liver. Electronically Signed   By: Inez Catalina M.D.   On: 10/15/2018 10:05       Assessment & Plan:   Problem List Items Addressed This Visit    Abnormal liver enzymes     Liver function tests 02/26/19 - wnl.  Recent abdominal ultrasound - changes c/w fatty liver.  She has adjusted her diet and lost weight.  Follow.        Double vision    Worked up by neurology as outlined.  MRI/MRA brain - negative MRI brain.  Did have some blood vessel irregularities as outlined. Plans to f/u with neurology.  Continues on daily aspirin.  No recurring episodes.  Follow.       Health care maintenance    Physical today 03/03/19.  mammogram 08/27/18 - Birads I. Colonoscopy 08/2016.        Hypercholesteremia    On pravastatin.  Low cholesterol diet and exercise.  Follow lipid panel and liver function tests.        Relevant Medications   aspirin EC 81 MG tablet   Other Relevant Orders   CBC with Differential/Platelet   TSH   Lipid panel   Hepatic function panel   Basic metabolic panel   Hyperglycemia    Low carb diet and exercise.  Follow met b and a1c.       Relevant Orders   Hemoglobin A1c   Obstructive sleep apnea    CPAP.       Renal cyst    Renal cyst - 4cm - found on abdominal ultrasound.  Reported - stable.  Will have urology evaluate - to confirm if f/u warranted.        Relevant Orders   Ambulatory referral to Urology    Other Visit Diagnoses    Routine general medical examination at a health care facility    -  Primary       Einar Pheasant, MD

## 2019-03-09 ENCOUNTER — Encounter: Payer: Self-pay | Admitting: Internal Medicine

## 2019-03-09 DIAGNOSIS — N281 Cyst of kidney, acquired: Secondary | ICD-10-CM | POA: Insufficient documentation

## 2019-03-09 NOTE — Assessment & Plan Note (Signed)
Physical today 03/03/19.  mammogram 08/27/18 - Birads I. Colonoscopy 08/2016.

## 2019-03-09 NOTE — Assessment & Plan Note (Signed)
Worked up by neurology as outlined.  MRI/MRA brain - negative MRI brain.  Did have some blood vessel irregularities as outlined. Plans to f/u with neurology.  Continues on daily aspirin.  No recurring episodes.  Follow.

## 2019-03-09 NOTE — Assessment & Plan Note (Signed)
Renal cyst - 4cm - found on abdominal ultrasound.  Reported - stable.  Will have urology evaluate - to confirm if f/u warranted.

## 2019-03-09 NOTE — Assessment & Plan Note (Signed)
On pravastatin.  Low cholesterol diet and exercise.  Follow lipid panel and liver function tests.   

## 2019-03-09 NOTE — Assessment & Plan Note (Signed)
Low carb diet and exercise.  Follow met b and a1c.  

## 2019-03-09 NOTE — Assessment & Plan Note (Signed)
Liver function tests 02/26/19 - wnl.  Recent abdominal ultrasound - changes c/w fatty liver.  She has adjusted her diet and lost weight.  Follow.

## 2019-03-09 NOTE — Assessment & Plan Note (Signed)
CPAP.  

## 2019-03-12 ENCOUNTER — Other Ambulatory Visit: Payer: Self-pay

## 2019-03-12 ENCOUNTER — Ambulatory Visit (INDEPENDENT_AMBULATORY_CARE_PROVIDER_SITE_OTHER): Payer: Medicare Other | Admitting: Urology

## 2019-03-12 ENCOUNTER — Encounter: Payer: Self-pay | Admitting: Urology

## 2019-03-12 VITALS — BP 130/74 | HR 90 | Ht 66.0 in | Wt 140.8 lb

## 2019-03-12 DIAGNOSIS — R002 Palpitations: Secondary | ICD-10-CM | POA: Diagnosis not present

## 2019-03-12 DIAGNOSIS — I429 Cardiomyopathy, unspecified: Secondary | ICD-10-CM | POA: Diagnosis not present

## 2019-03-12 DIAGNOSIS — I10 Essential (primary) hypertension: Secondary | ICD-10-CM | POA: Diagnosis not present

## 2019-03-12 DIAGNOSIS — N281 Cyst of kidney, acquired: Secondary | ICD-10-CM

## 2019-03-12 NOTE — Progress Notes (Signed)
03/12/2019 9:14 AM   [name] March 13, 1945 [id number]  Referring provider: Einar Pheasant, [place] [id number] [place],  [place] [address]  Chief Complaint  Patient presents with  . Renal cyst    HPI: ALYNNA Wolfe is a 74 yo F who presents today for the evaluation and management of a renal cyst. She was referred to [place] by Einar Pheasant, MD.  Her stable left renal cyst was incidental on an Abd ultrasound from 10/15/2018.  A left upper pole Bosniak 1 was seen on a previous imaging in the form of a RUS in 2013 and is the same size of 4.3 x 4.0 x 4.6 from that time. Currently the cyst measures 4.3 x 4.0 x 4.1.   Pt denies any associated pain. She is doing well and is physically active.    PMH: Past Medical History:  Diagnosis Date  . Anemia    h/o as a child  . Arthritis    left shoulder  . 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 Surgical removal  . Complication of anesthesia   . Hemorrhoids   . History of colonoscopy 2003   Normal, Dr. Sammuel Cooper, [place]   . Hypercholesterolemia   . Normal exercise sestamibi stress test 2008   Ken Fath  . Obstructive sleep apnea on CPAP 2007  . PONV (postoperative nausea and vomiting)   . Rotator cuff tear, left   . Sleep apnea     Surgical History: Past Surgical History:  Procedure Laterality Date  . ABDOMINALPLASTY W/ LIPOSUCTION  AGE 31  . BUNIONECTOMY  X3  LAST ONE 2003  . CHOLECYSTECTOMY N/A 04/19/2015   Procedure: LAPAROSCOPIC CHOLECYSTECTOMY WITH INTRAOPERATIVE CHOLANGIOGRAM;  Surgeon: Robert Bellow, MD;  Location: [facility] ORS;  Service: General;  Laterality: N/A;  . COLONOSCOPY  2012   Dr Wyline Mood in [place]  . COLONOSCOPY WITH PROPOFOL N/A 09/11/2016   Procedure: COLONOSCOPY WITH PROPOFOL;  Surgeon: Lollie Sails, MD;  Location: [facility] ENDOSCOPY;  Service: Endoscopy;  Laterality: N/A;  . ERCP N/A 04/29/2015   Procedure: ENDOSCOPIC RETROGRADE CHOLANGIOPANCREATOGRAPHY (ERCP);   Surgeon: Hulen Luster, MD;  [facility] ENDOSCOPY;  Service: Gastroenterology;  Laterality: N/A;  . EYE SURGERY     cataract extraction  . NASAL SEPTUM SURGERY  AGE 50  . NASAL SINUS SURGERY  AGE 71  . SHOULDER ARTHROSCOPY  10/25/2011   Procedure: ARTHROSCOPY SHOULDER;  Surgeon: Magnus Sinning, MD;  Location: [facility];  Service: Orthopedics;  Laterality: Left;  with SAD, shave of the labrium   . TUBAL LIGATION  1982    Home Medications:  Allergies as of 03/12/2019      Reactions   Codeine Nausea And Vomiting      Medication List       Accurate as of March 12, 2019 11:59 PM. If you have any questions, ask your nurse or doctor.        aspirin EC 81 MG tablet Take 81 mg by mouth daily.   fluticasone 50 MCG/ACT nasal spray Commonly known as: FLONASE Place 2 sprays into both nostrils as needed.   latanoprost 0.005 % ophthalmic solution Commonly known as: XALATAN Place 1 drop into both eyes.   pravastatin 10 MG tablet Commonly known as: PRAVACHOL TAKE 1 TABLET BY MOUTH DAILY   saccharomyces boulardii 250 MG capsule Commonly known as: FLORASTOR Take by mouth.       Allergies:  Allergies  Allergen Reactions  .  Codeine Nausea And Vomiting    Family History: Family History  Problem Relation Age of Onset  . Acute myelogenous leukemia Sister   . Polycythemia Sister 46  . Emphysema Father        smoker  . Heart disease Father        myocardial infarction - 19  . COPD Father   . Heart attack Father   . Breast cancer Paternal Aunt   . Testicular cancer Brother   . Heart disease Mother   . Emphysema Mother   . COPD Mother   . Asthma Mother   . Heart attack Mother   . Leukemia Sister   . Colon cancer Neg Hx     Social History:  reports that she quit smoking about 41 years ago. Her smoking use included cigarettes. She has a 25.00 pack-year smoking history. She has never used smokeless tobacco. She reports that she does not drink alcohol or  use drugs.   Physical Exam: BP 130/74 (BP Location: Left Arm, Patient Position: Sitting, Cuff Size: Normal)   Pulse 90   Ht 5\' 6"  (1.676 m)   Wt 140 lb 12.8 oz (63.9 kg)   BMI 22.73 kg/m   Constitutional:  Alert and oriented, No acute distress. HEENT: [place] AT, moist mucus membranes.  Trachea midline, no masses. Cardiovascular: No clubbing, cyanosis, or edema. Respiratory: Normal respiratory effort, no increased work of breathing. Skin: No rashes, bruises or suspicious lesions. Neurologic: Grossly intact, no focal deficits, moving all 4 extremities. Psychiatric: Normal mood and affect.  Laboratory Data: Lab Results  Component Value Date   WBC 4.2 08/23/2018   HGB 12.5 08/23/2018   HCT 37.3 08/23/2018   MCV 85.9 08/23/2018   PLT 156.0 08/23/2018    Lab Results  Component Value Date   CREATININE 0.94 02/26/2019    Lab Results  Component Value Date   HGBA1C 5.7 02/26/2019    Urinalysis UA negative, see epic  Pertinent Imaging: CLINICAL DATA:  Abnormal LFTs  EXAM: ABDOMEN ULTRASOUND COMPLETE  COMPARISON:  12/05/2011  FINDINGS: Gallbladder: Surgically removed  Common bile duct: Diameter: 5 mm  Liver: Diffusely increased in echogenicity consistent with fatty infiltration. Portal vein is patent on color Doppler imaging with normal direction of blood flow towards the liver.  IVC: No abnormality visualized.  Pancreas: Visualized portion unremarkable.  Spleen: Size and appearance within normal limits.  Right Kidney: Length: 10.8 cm. Echogenicity within normal limits. No mass or hydronephrosis visualized.  Left Kidney: Length: 11.0 cm. 4.1 cm cyst is noted in the upper pole of the left kidney. This is stable from a prior exam. No obstructive changes are noted.  Abdominal aorta: No aneurysm visualized.  Other findings: None.  IMPRESSION: Stable left renal cyst.  Status post cholecystectomy.  Fatty infiltration of the liver.    Electronically Signed   By: Inez Catalina M.D.   On: 10/15/2018 10:05  I have personally reviewed the images and agree with radiologist interpretation.   Assessment & Plan:    1. Bosniak 1 Renal Cyst  Discussed pathophysiology of Bosniak 1 Renal Cyst  No further intervention needed F/u as needed   Return if symptoms worsen or fail to improve.  [facility] [address] [place], [place] [address] [phone]  I, Lucas Mallow, am acting as a scribe for Dr. Hollice Espy,  Hollice Espy, MD

## 2019-03-13 LAB — URINALYSIS, COMPLETE
Bilirubin, UA: NEGATIVE
Glucose, UA: NEGATIVE
Ketones, UA: NEGATIVE
Nitrite, UA: NEGATIVE
Protein,UA: NEGATIVE
RBC, UA: NEGATIVE
Specific Gravity, UA: >1.03 — ABNORMAL HIGH (ref 1.005–1.030)
Urobilinogen, Ur: 0.2 mg/dL (ref 0.2–1.0)
pH, UA: 5.5 (ref 5.0–7.5)

## 2019-03-13 LAB — MICROSCOPIC EXAMINATION: RBC, Urine: NONE SEEN /hpf (ref 0–2)

## 2019-04-01 ENCOUNTER — Ambulatory Visit: Payer: Medicare Other | Attending: Internal Medicine

## 2019-04-01 DIAGNOSIS — Z23 Encounter for immunization: Secondary | ICD-10-CM

## 2019-04-01 NOTE — Progress Notes (Signed)
   Covid-19 Vaccination Clinic  Name:  MARYSUE ANTONINO    MRN: [id number] DOB: June 24, 1945  04/01/2019  Ms. Richman was observed post Covid-19 immunization for 15 minutes without incident. She was provided with Vaccine Information Sheet and instruction to access the V-Safe system.   Ms. Minck was instructed to call 911 with any severe reactions post vaccine: Marland Kitchen Difficulty breathing  . Swelling of face and throat  . A fast heartbeat  . A bad rash all over body  . Dizziness and weakness   Immunizations Administered    Name Date Dose VIS Date Route   Pfizer COVID-19 Vaccine 04/01/2019 10:12 AM 0.3 mL 12/27/2018 Intramuscular   Manufacturer: [place]   Lot: [id number]   [place]: [id number]

## 2019-05-02 ENCOUNTER — Ambulatory Visit (INDEPENDENT_AMBULATORY_CARE_PROVIDER_SITE_OTHER): Payer: Medicare Other

## 2019-05-02 VITALS — BP 119/69 | HR 84 | Ht 66.0 in | Wt 138.4 lb

## 2019-05-02 DIAGNOSIS — Z0000 Encounter for general adult medical examination without abnormal findings: Secondary | ICD-10-CM

## 2019-05-02 NOTE — Progress Notes (Addendum)
Subjective:   Dawn Wolfe is a 74 y.o. female who presents for Medicare Annual (Subsequent) preventive examination.  Review of Systems:  No ROS.  Medicare Wellness Virtual Visit.  Visual/audio telehealth visit. Vitals provided by patient. See social history for additional risk factors.  Cardiac Risk Factors include: advanced age (>56men, >20 women)     Objective:     Vitals: BP 119/69 (BP Location: Left Arm, Patient Position: Sitting, Cuff Size: Normal)   Pulse 84   Ht 5\' 6"  (1.676 m)   Wt 138 lb 6.4 oz (62.8 kg)   BMI 22.34 kg/m   Body mass index is 22.34 kg/m.  Advanced Directives 05/02/2019 05/01/2018 09/11/2016 04/29/2015 11/26/2014 10/25/2011  Does Patient Have a Medical Advance Directive? Yes Yes Yes Yes Yes Patient has advance directive, copy not in chart  Type of Advance Directive Healthcare Power of [place];Living will [place];Living will [place];Living will Living will;Healthcare Power of Attorney Living will;Healthcare Power of Attorney  Does patient want to make changes to medical advance directive? No - Patient declined No - Patient declined - - No - Patient declined -  Copy of [place] in Chart? No - copy requested No - copy requested No - copy requested No - copy requested No - copy requested Copy requested from family  Pre-existing out of facility DNR order (yellow form or pink MOST form) - - - - - No    Tobacco Social History   Tobacco Use  Smoking Status Former Smoker  . Packs/day: 2.50  . Years: 10.00  . Pack years: 25.00  . Types: Cigarettes  . Quit date: 03/28/1977  . Years since quitting: 42.1  Smokeless Tobacco Never Used     Counseling given: Not Answered   Clinical Intake:  Pre-visit preparation completed: Yes        Diabetes: No  How often do you need to have someone help you when you read instructions, pamphlets, or other written  materials from your doctor or pharmacy?: 1 - Never  Interpreter Needed?: No     Past Medical History:  Diagnosis Date  . Anemia    h/o as a child  . Arthritis    left shoulder  . 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 Complication of anesthesia   . Hemorrhoids   . History of colonoscopy 2003   Normal, Dr. Sammuel Cooper, [place]   . Hypercholesterolemia   . Normal exercise sestamibi stress test 2008   Ken Fath  . Obstructive sleep apnea on CPAP 2007  . PONV (postoperative nausea and vomiting)   . Rotator cuff tear, left   . Sleep apnea    Past Surgical History:  Procedure Laterality Date  . ABDOMINALPLASTY W/ LIPOSUCTION  AGE 74  . BUNIONECTOMY  X3  LAST ONE 2003  . CHOLECYSTECTOMY N/A 04/19/2015   Procedure: LAPAROSCOPIC CHOLECYSTECTOMY WITH INTRAOPERATIVE CHOLANGIOGRAM;  Surgeon: Robert Bellow, MD;  Location: [facility] ORS;  Service: General;  Laterality: N/A;  . COLONOSCOPY  2012   Dr Wyline Mood in [place]  . COLONOSCOPY WITH PROPOFOL N/A 09/11/2016   Procedure: COLONOSCOPY WITH PROPOFOL;  Surgeon: Lollie Sails, MD;  Location: [facility] ENDOSCOPY;  Service: Endoscopy;  Laterality: N/A;  . ERCP N/A 04/29/2015   Procedure: ENDOSCOPIC RETROGRADE CHOLANGIOPANCREATOGRAPHY (ERCP);  Surgeon: Hulen Luster, MD;  Location: [facility] ENDOSCOPY;  Service: Gastroenterology;  Laterality: N/A;  . EYE SURGERY  cataract extraction  . NASAL SEPTUM SURGERY  AGE 27  . NASAL SINUS SURGERY  AGE 40  . SHOULDER ARTHROSCOPY  10/25/2011   Procedure: ARTHROSCOPY SHOULDER;  Surgeon: Magnus Sinning, MD;  Location: [facility];  Service: Orthopedics;  Laterality: Left;  with SAD, shave of the labrium   . TUBAL LIGATION  1982   Family History  Problem Relation Age of Onset  . Acute myelogenous leukemia Sister   . Polycythemia Sister 101  . Emphysema Father        smoker  . Heart disease Father        myocardial infarction - 55  . COPD Father   . Heart  attack Father   . Breast cancer Paternal Aunt   . Testicular cancer Brother   . Heart disease Mother   . Emphysema Mother   . COPD Mother   . Asthma Mother   . Heart attack Mother   . Leukemia Sister   . Colon cancer Neg Hx    Social History   Socioeconomic History  . Marital status: Married    Spouse name: Not on file  . Number of children: Not on file  . Years of education: Not on file  . Highest education level: Not on file  Occupational History  . Not on file  Tobacco Use  . Smoking status: Former Smoker    Packs/day: 2.50    Years: 10.00    Pack years: 25.00    Types: Cigarettes    Quit date: 03/28/1977    Years since quitting: 42.1  . Smokeless tobacco: Never Used  Substance and Sexual Activity  . Alcohol use: No    Alcohol/week: 0.0 standard drinks  . Drug use: No  . Sexual activity: Not Currently    Birth control/protection: Post-menopausal  Other Topics Concern  . Not on file  Social History Narrative  . Not on file   Social Determinants of Health   Financial Resource Strain:   . Difficulty of Paying Living Expenses:   Food Insecurity:   . Worried About Charity fundraiser in the Last Year:   . Arboriculturist in the Last Year:   Transportation Needs:   . Film/video editor (Medical):   Marland Kitchen Lack of Transportation (Non-Medical):   Physical Activity:   . Days of Exercise per Week:   . Minutes of Exercise per Session:   Stress:   . Feeling of Stress :   Social Connections:   . Frequency of Communication with Friends and Family:   . Frequency of Social Gatherings with Friends and Family:   . Attends Religious Services:   . Active Member of Clubs or Organizations:   . Attends Archivist Meetings:   Marland Kitchen Marital Status:     Outpatient Encounter Medications as of 05/02/2019  Medication Sig  . aspirin EC 81 MG tablet Take 81 mg by mouth daily.  . fluticasone (FLONASE) 50 MCG/ACT nasal spray Place 2 sprays into both nostrils as needed.  .  latanoprost (XALATAN) 0.005 % ophthalmic solution Place 1 drop into both eyes.  . pravastatin (PRAVACHOL) 10 MG tablet TAKE 1 TABLET BY MOUTH DAILY  . saccharomyces boulardii (FLORASTOR) 250 MG capsule Take by mouth.   No facility-administered encounter medications on file as of 05/02/2019.    Activities of Daily Living In your present state of health, do you have any difficulty performing the following activities: 05/02/2019  Hearing? N  Vision? N  Difficulty  concentrating or making decisions? N  Walking or climbing stairs? N  Dressing or bathing? N  Doing errands, shopping? N  Preparing Food and eating ? N  Using the Toilet? N  In the past six months, have you accidently leaked urine? N  Do you have problems with loss of bowel control? N  Managing your Medications? N  Managing your Finances? N  Housekeeping or managing your Housekeeping? N  Some recent data might be hidden    Patient Care Team: Einar Pheasant, MD as PCP - General (Internal Medicine) Einar Pheasant, MD (Internal Medicine) Bary Castilla Forest Gleason, MD (General Surgery)    Assessment:   This is a routine wellness examination for Jennah.  Nurse connected with patient 05/02/19 at 10:00 AM EDT by a telephone enabled telemedicine application and verified that I am speaking with the correct person using two identifiers. Patient stated full name and DOB. Patient gave permission to continue with virtual visit. Patient's location was at home and Nurse's location was at [place] office.   Patient is alert and oriented x3. Patient denies difficulty focusing or concentrating. Patient likes to play rummy cube for brain health.  Health Maintenance Due: See completed HM at the end of note.   Eye: Visual acuity not assessed. Virtual visit. Followed by their ophthalmologist.  Glaucoma suspect; drops in use. Visits every 3 months.   Dental: UTD  Hearing: Demonstrates normal hearing during visit.  Safety:  Patient feels  safe at home- yes Patient does have smoke detectors at home- yes Patient does wear sunscreen or protective clothing when in direct sunlight - yes Patient does wear seat belt when in a moving vehicle - yes Patient drives- yes Adequate lighting in walkways free from debris- yes Grab bars and handrails used as appropriate- yes Ambulates with an assistive device- no Cell phone on person when ambulating outside of the home-yes  Social: Alcohol intake - no     Smoking history- former Smokers in home? none Illicit drug use? none  Medication: Taking as directed and without issues.  Self managed - yes   Covid-19: Precautions and sickness symptoms discussed. Wears mask, social distancing, hand hygiene as appropriate.   Activities of Daily Living Patient denies needing assistance with: household chores, feeding themselves, getting from bed to chair, getting to the toilet, bathing/showering, dressing, managing money, or preparing meals.   Discussed the importance of a healthy diet, water intake and the benefits of aerobic exercise. Physical activity- very active working outside in the yard and in the home.  Diet:  Low Carb Water: good intake Caffeine: 3 cups of coffee  Other Providers Patient Care Team: Einar Pheasant, MD as PCP - General (Internal Medicine) Einar Pheasant, MD (Internal Medicine) Bary Castilla Forest Gleason, MD (General Surgery)  Exercise Activities and Dietary recommendations Current Exercise Habits: Home exercise routine  Goals    . [place] Healthy Lifestyle     Stay active Stay hydrated Healthy diet       Fall Risk Fall Risk  05/02/2019 03/03/2019 05/01/2018 08/16/2017 08/10/2016  Falls in the past year? 0 0 0 No No  Number falls in past yr: - - - - -  Injury with Fall? - - - - -  Risk for fall due to : - - - - (No Data)  Risk for fall due to: Comment - - - - no falls  Follow up Falls evaluation completed Falls evaluation completed - - -   Timed Get Up and Go  performed: no, virtual visit  Depression Screen PHQ 2/9 Scores 05/02/2019 05/01/2018 08/16/2017 08/10/2016  PHQ - 2 Score 0 0 0 0  PHQ- 9 Score - - - 0     Cognitive Function MMSE - Mini Mental State Exam 11/26/2014  Orientation to time 5  Orientation to Place 5  Registration 3  Attention/ Calculation 5  Recall 3  Language- name 2 objects 2  Language- repeat 1  Language- follow 3 step command 3  Language- read & follow direction 1  Write a sentence 1  Copy design 1  Total score 30     6CIT Screen 05/02/2019 05/01/2018  What Year? 0 points 0 points  What month? 0 points 0 points  What time? - 0 points  Count back from 20 0 points 0 points  Months in reverse - 0 points  Repeat phrase - 0 points  Total Score - 0    Immunization History  Administered Date(s) Administered  . Influenza Split 10/26/2010, 10/16/2011, 10/21/2012, 08/22/2013  . Influenza, High Dose Seasonal PF 10/18/2016, 10/14/2018  . Influenza-Unspecified 10/08/2014, 10/20/2015, 10/24/2017  . PFIZER SARS-COV-2 Vaccination 03/02/2019, 04/01/2019  . Pneumococcal Conjugate-13 11/26/2014  . Pneumococcal Polysaccharide-23 12/07/2015  . Tdap 02/24/2015  . Zoster 04/10/2013   Screening Tests Health Maintenance  Topic Date Due  . MAMMOGRAM  08/26/2020  . TETANUS/TDAP  02/23/2025  . COLONOSCOPY  09/12/2026  . DEXA SCAN  Completed  . Hepatitis C Screening  Completed  . PNA vac Low Risk Adult  Completed  . INFLUENZA VACCINE  Discontinued      Plan:   Keep all routine maintenance appointments.   Fasting labs 08/28/19 @ 8:15  Follow up 09/01/19 @ 10:00  Medicare Attestation I have personally reviewed: The patient's medical and social history Their use of alcohol, tobacco or illicit drugs Their current medications and supplements The patient's functional ability including ADLs,fall risks, home safety risks, cognitive, and hearing and visual impairment Diet and physical activities Evidence for depression   I  have reviewed and discussed with patient certain preventive protocols, quality metrics, and best practice recommendations.      Varney Biles, LPN  [id number] Agree  TMS per Dr. Nicki Reaper

## 2019-05-02 NOTE — Patient Instructions (Addendum)
  [name] , Thank you for taking time to come for your Medicare Wellness Visit. I appreciate your ongoing commitment to your health goals. Please review the following plan we discussed and let me know if I can assist you in the future.   Keep all routine maintenance appointments.   Fasting labs 08/28/19 @ 8:15  Follow up 09/01/19 @ 10:00   These are the goals we discussed: Goals    . [place] Healthy Lifestyle     Stay active Stay hydrated Healthy diet       This is a list of the screening recommended for you and due dates:  Health Maintenance  Topic Date Due  . Mammogram  08/26/2020  . Tetanus Vaccine  02/23/2025  . Colon Cancer Screening  09/12/2026  . DEXA scan (bone density measurement)  Completed  .  Hepatitis C: One time screening is recommended by Center for Disease Control  (CDC) for  adults born from [birth year] through 1964.   Completed  . Pneumonia vaccines  Completed  . Flu Shot  Discontinued

## 2019-07-08 ENCOUNTER — Other Ambulatory Visit: Payer: Self-pay | Admitting: Internal Medicine

## 2019-07-28 ENCOUNTER — Other Ambulatory Visit: Payer: Self-pay | Admitting: Internal Medicine

## 2019-07-28 DIAGNOSIS — H524 Presbyopia: Secondary | ICD-10-CM | POA: Diagnosis not present

## 2019-07-28 DIAGNOSIS — H40153 Residual stage of open-angle glaucoma, bilateral: Secondary | ICD-10-CM | POA: Diagnosis not present

## 2019-07-28 DIAGNOSIS — Z1231 Encounter for screening mammogram for malignant neoplasm of breast: Secondary | ICD-10-CM

## 2019-08-16 DIAGNOSIS — G4733 Obstructive sleep apnea (adult) (pediatric): Secondary | ICD-10-CM | POA: Diagnosis not present

## 2019-08-28 ENCOUNTER — Other Ambulatory Visit: Payer: Self-pay | Admitting: Internal Medicine

## 2019-08-28 ENCOUNTER — Other Ambulatory Visit (INDEPENDENT_AMBULATORY_CARE_PROVIDER_SITE_OTHER): Payer: Medicare Other

## 2019-08-28 ENCOUNTER — Other Ambulatory Visit: Payer: Self-pay

## 2019-08-28 ENCOUNTER — Ambulatory Visit
Admission: RE | Admit: 2019-08-28 | Discharge: 2019-08-28 | Disposition: A | Discharge status: Home / Self Care | Payer: Medicare Other | Source: Ambulatory Visit | Attending: Internal Medicine | Admitting: Internal Medicine

## 2019-08-28 DIAGNOSIS — R739 Hyperglycemia, unspecified: Secondary | ICD-10-CM

## 2019-08-28 DIAGNOSIS — E7800 Pure hypercholesterolemia, unspecified: Secondary | ICD-10-CM | POA: Diagnosis not present

## 2019-08-28 DIAGNOSIS — Z1231 Encounter for screening mammogram for malignant neoplasm of breast: Secondary | ICD-10-CM | POA: Diagnosis not present

## 2019-08-28 DIAGNOSIS — N6320 Unspecified lump in the left breast, unspecified quadrant: Secondary | ICD-10-CM

## 2019-08-28 DIAGNOSIS — R928 Other abnormal and inconclusive findings on diagnostic imaging of breast: Secondary | ICD-10-CM

## 2019-08-28 LAB — CBC WITH DIFFERENTIAL/PLATELET
Basophils Absolute: 0 10*3/uL (ref 0.0–0.1)
Basophils Relative: 0.8 % (ref 0.0–3.0)
Eosinophils Absolute: 0.3 10*3/uL (ref 0.0–0.7)
Eosinophils Relative: 5.3 % — ABNORMAL HIGH (ref 0.0–5.0)
HCT: 37.3 % (ref 36.0–46.0)
Hemoglobin: 12.9 g/dL (ref 12.0–15.0)
Lymphocytes Relative: 36.8 % (ref 12.0–46.0)
Lymphs Abs: 2 10*3/uL (ref 0.7–4.0)
MCHC: 34.6 g/dL (ref 30.0–36.0)
MCV: 87.9 fl (ref 78.0–100.0)
Monocytes Absolute: 0.4 10*3/uL (ref 0.1–1.0)
Monocytes Relative: 6.6 % (ref 3.0–12.0)
Neutro Abs: 2.7 10*3/uL (ref 1.4–7.7)
Neutrophils Relative %: 50.5 % (ref 43.0–77.0)
Platelets: 151 10*3/uL (ref 150.0–400.0)
RBC: 4.25 Mil/uL (ref 3.87–5.11)
RDW: 13.6 % (ref 11.5–15.5)
WBC: 5.4 10*3/uL (ref 4.0–10.5)

## 2019-08-28 LAB — LIPID PANEL
Cholesterol: 147 mg/dL (ref 0–200)
HDL: 44.4 mg/dL (ref >39.00)
LDL Cholesterol: 75 mg/dL (ref 0–99)
NonHDL: 102.13
Total CHOL/HDL Ratio: 3
Triglycerides: 136 mg/dL (ref 0.0–149.0)
VLDL: 27.2 mg/dL (ref 0.0–40.0)

## 2019-08-28 LAB — BASIC METABOLIC PANEL
BUN: 17 mg/dL (ref 6–23)
CO2: 30 mEq/L (ref 19–32)
Calcium: 9.9 mg/dL (ref 8.4–10.5)
Chloride: 102 mEq/L (ref 96–112)
Creatinine, Ser: 0.89 mg/dL (ref 0.40–1.20)
GFR: 62.19 mL/min (ref >60.00)
Glucose, Bld: 97 mg/dL (ref 70–99)
Potassium: 4.5 mEq/L (ref 3.5–5.1)
Sodium: 139 mEq/L (ref 135–145)

## 2019-08-28 LAB — HEPATIC FUNCTION PANEL
ALT: 17 U/L (ref 0–35)
AST: 23 U/L (ref 0–37)
Albumin: 4.5 g/dL (ref 3.5–5.2)
Alkaline Phosphatase: 50 U/L (ref 39–117)
Bilirubin, Direct: 0.2 mg/dL (ref 0.0–0.3)
Total Bilirubin: 1 mg/dL (ref 0.2–1.2)
Total Protein: 7 g/dL (ref 6.0–8.3)

## 2019-08-28 LAB — TSH: TSH: 2.93 u[IU]/mL (ref 0.35–4.50)

## 2019-08-28 LAB — HEMOGLOBIN A1C: Hgb A1c MFr Bld: 5.7 % (ref 4.6–6.5)

## 2019-09-01 ENCOUNTER — Encounter: Payer: Self-pay | Admitting: Internal Medicine

## 2019-09-01 ENCOUNTER — Telehealth (INDEPENDENT_AMBULATORY_CARE_PROVIDER_SITE_OTHER): Payer: Medicare Other | Admitting: Internal Medicine

## 2019-09-01 DIAGNOSIS — E7800 Pure hypercholesterolemia, unspecified: Secondary | ICD-10-CM | POA: Diagnosis not present

## 2019-09-01 DIAGNOSIS — R739 Hyperglycemia, unspecified: Secondary | ICD-10-CM

## 2019-09-01 DIAGNOSIS — N281 Cyst of kidney, acquired: Secondary | ICD-10-CM | POA: Diagnosis not present

## 2019-09-01 DIAGNOSIS — G4733 Obstructive sleep apnea (adult) (pediatric): Secondary | ICD-10-CM | POA: Diagnosis not present

## 2019-09-01 NOTE — Progress Notes (Signed)
Patient ID: Dawn Wolfe, female   DOB: [birth year], 74 y.o.   MRN: [id number]   Virtual Visit via video Note  This visit type was conducted due to national recommendations for restrictions regarding the COVID-19 pandemic (e.g. social distancing).  This format is felt to be most appropriate for this patient at this time.  All issues noted in this document were discussed and addressed.  No physical exam was performed (except for noted visual exam findings with Video Visits).   I connected with [name] today by a video enabled telemedicine application and verified that I am speaking with the correct person using two identifiers. Location patient: home Location provider: work Persons participating in the virtual visit: patient, provider  The limitations, risks, security and privacy concerns of performing an evaluation and management service by video and the availability of in person appointments have been discussed.   It has also been discussed with the patient that there may be a patient responsible charge related to this service. The patient expressed understanding and agreed to proceed.   Reason for visit: scheduled follow up.   HPI: She reports she is doing well.  Feels good.  Stays active.  Continues to watch her diet.  No chest pain or sob.  No acid reflux or abdominal pain reported.  Bowels moving.  Discussed labs.  Cholesterol looks good.  a1c 5.7.  Had mammogram.  Recommended f/u views of right breast.  Ordered.  Evaluated by Dr Erlene Quan - 02/2019 - renal cyst (B[place]niak 1). No further intervention warranted.  F/u prn.  Taking magnesium/fish oil.  Helping with cramps.  Blood pressure doing well - 103/65, 78 and 117/61, 86.  Overall feels good.    ROS: See pertinent positives and negatives per HPI.  Past Medical History:  Diagnosis Date  . Anemia    h/o as a child  . Arthritis    left shoulder  . 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 Surgical removal  .  Complication of anesthesia   . Hemorrhoids   . History of colonoscopy 2003   Normal, Dr. Sammuel Cooper, [place]   . Hypercholesterolemia   . Normal exercise sestamibi stress test 2008   Ken Fath  . Obstructive sleep apnea on CPAP 2007  . PONV (postoperative nausea and vomiting)   . Rotator cuff tear, left   . Sleep apnea     Past Surgical History:  Procedure Laterality Date  . ABDOMINALPLASTY W/ LIPOSUCTION  AGE 89  . BUNIONECTOMY  X3  LAST ONE 2003  . CHOLECYSTECTOMY N/A 04/19/2015   Procedure: LAPAROSCOPIC CHOLECYSTECTOMY WITH INTRAOPERATIVE CHOLANGIOGRAM;  Surgeon: Robert Bellow, MD;  Location: [facility] ORS;  Service: General;  Laterality: N/A;  . COLONOSCOPY  2012   Dr Wyline Mood in [place]  . COLONOSCOPY WITH PROPOFOL N/A 09/11/2016   Procedure: COLONOSCOPY WITH PROPOFOL;  Surgeon: Lollie Sails, MD;  Location: [facility] ENDOSCOPY;  Service: Endoscopy;  Laterality: N/A;  . ERCP N/A 04/29/2015   Procedure: ENDOSCOPIC RETROGRADE CHOLANGIOPANCREATOGRAPHY (ERCP);  Surgeon: Hulen Luster, MD;  Location: [facility] ENDOSCOPY;  Service: Gastroenterology;  Laterality: N/A;  . EYE SURGERY     cataract extraction  . NASAL SEPTUM SURGERY  AGE 80  . NASAL SINUS SURGERY  AGE 62  . SHOULDER ARTHROSCOPY  10/25/2011   Procedure: ARTHROSCOPY SHOULDER;  Surgeon: Magnus Sinning, MD;  Location: [facility];  Service: Orthopedics;  Laterality: Left;  with SAD, shave of the labrium   .  TUBAL LIGATION  1982    Family History  Problem Relation Age of Onset  . Acute myelogenous leukemia Sister   . Polycythemia Sister 22  . Emphysema Father        smoker  . Heart disease Father        myocardial infarction - 39  . COPD Father   . Heart attack Father   . Breast cancer Paternal Aunt   . Testicular cancer Brother   . Heart disease Mother   . Emphysema Mother   . COPD Mother   . Asthma Mother   . Heart attack Mother   . Leukemia Sister   . Colon cancer Neg Hx     SOCIAL HX: reviewed.     Current Outpatient Medications:  .  aspirin EC 81 MG tablet, Take 81 mg by mouth daily., Disp: , Rfl:  .  fluticasone (FLONASE) 50 MCG/ACT nasal spray, Place 2 sprays into both nostrils as needed., Disp: 16 g, Rfl: 3 .  latanoprost (XALATAN) 0.005 % ophthalmic solution, Place 1 drop into both eyes., Disp: , Rfl: 3 .  pravastatin (PRAVACHOL) 10 MG tablet, TAKE 1 TABLET BY MOUTH DAILY, Disp: 90 tablet, Rfl: 1 .  saccharomyces boulardii (FLORASTOR) 250 MG capsule, Take by mouth., Disp: , Rfl:   EXAM:  VITALS per patient if applicable: 388/82, 78  GENERAL: alert, oriented, appears well and in no acute distress  HEENT: atraumatic, conjunttiva clear, no obvious abnormalities on inspection of external nose and ears  NECK: normal movements of the head and neck  LUNGS: on inspection no signs of respiratory distress, breathing rate appears normal, no obvious gross SOB, gasping or wheezing  CV: no obvious cyanosis  PSYCH/NEURO: pleasant and cooperative, no obvious depression or anxiety, speech and thought processing grossly intact  ASSESSMENT AND PLAN:  Discussed the following assessment and plan:  Renal cyst Evaluated by Dr Erlene Quan.  No further w/up warranted.  Recommended f/u prn.   Obstructive sleep apnea CPAP.   Hyperglycemia Has adjusted diet.  Watching what she eats.  Follow met b and a1c.   Hypercholesteremia On pravastatin. Has adjusted diet.  Follow lipid panel and liver function tests.   Lab Results  Component Value Date   CHOL 147 08/28/2019   HDL 44.40 08/28/2019   LDLCALC 75 08/28/2019   LDLDIRECT 88.0 07/22/2014   TRIG 136.0 08/28/2019   CHOLHDL 3 08/28/2019    Orders Placed This Encounter  Procedures  . Hemoglobin A1c    Standing Status:   Future    Standing Expiration Date:   09/07/2020  . Hepatic function panel    Standing Status:   Future    Standing Expiration Date:   09/07/2020  . Lipid panel    Standing Status:   Future    Standing Expiration  Date:   09/07/2020  . Basic metabolic panel    Standing Status:   Future    Standing Expiration Date:   09/07/2020     I discussed the assessment and treatment plan with the patient. The patient was provided an opportunity to ask questions and all were answered. The patient agreed with the plan and demonstrated an understanding of the instructions.   The patient was advised to call back or seek an in-person evaluation if the symptoms worsen or if the condition fails to improve as anticipated.   Einar Pheasant, MD

## 2019-09-08 ENCOUNTER — Encounter: Payer: Self-pay | Admitting: Internal Medicine

## 2019-09-08 NOTE — Assessment & Plan Note (Signed)
Has adjusted diet.  Watching what she eats.  Follow met b and a1c.

## 2019-09-08 NOTE — Assessment & Plan Note (Signed)
Evaluated by Dr Erlene Quan.  No further w/up warranted.  Recommended f/u prn.

## 2019-09-08 NOTE — Assessment & Plan Note (Signed)
On pravastatin. Has adjusted diet.  Follow lipid panel and liver function tests.   Lab Results  Component Value Date   CHOL 147 08/28/2019   HDL 44.40 08/28/2019   LDLCALC 75 08/28/2019   LDLDIRECT 88.0 07/22/2014   TRIG 136.0 08/28/2019   CHOLHDL 3 08/28/2019

## 2019-09-08 NOTE — Assessment & Plan Note (Signed)
CPAP.  

## 2019-09-11 ENCOUNTER — Other Ambulatory Visit: Payer: Self-pay | Admitting: Internal Medicine

## 2019-09-11 ENCOUNTER — Ambulatory Visit
Admission: RE | Admit: 2019-09-11 | Discharge: 2019-09-11 | Disposition: A | Discharge status: Home / Self Care | Payer: Medicare Other | Source: Ambulatory Visit | Attending: Internal Medicine | Admitting: Internal Medicine

## 2019-09-11 DIAGNOSIS — R928 Other abnormal and inconclusive findings on diagnostic imaging of breast: Secondary | ICD-10-CM

## 2019-09-11 DIAGNOSIS — N6489 Other specified disorders of breast: Secondary | ICD-10-CM

## 2019-09-11 DIAGNOSIS — N6320 Unspecified lump in the left breast, unspecified quadrant: Secondary | ICD-10-CM | POA: Diagnosis not present

## 2019-09-11 DIAGNOSIS — R922 Inconclusive mammogram: Secondary | ICD-10-CM | POA: Diagnosis not present

## 2019-09-19 ENCOUNTER — Other Ambulatory Visit: Payer: Self-pay

## 2019-09-19 ENCOUNTER — Ambulatory Visit (INDEPENDENT_AMBULATORY_CARE_PROVIDER_SITE_OTHER): Payer: Medicare Other

## 2019-09-19 DIAGNOSIS — Z23 Encounter for immunization: Secondary | ICD-10-CM | POA: Diagnosis not present

## 2019-10-20 DIAGNOSIS — H532 Diplopia: Secondary | ICD-10-CM | POA: Diagnosis not present

## 2019-10-20 DIAGNOSIS — Z9989 Dependence on other enabling machines and devices: Secondary | ICD-10-CM | POA: Diagnosis not present

## 2019-10-20 DIAGNOSIS — G4733 Obstructive sleep apnea (adult) (pediatric): Secondary | ICD-10-CM | POA: Diagnosis not present

## 2019-10-21 ENCOUNTER — Ambulatory Visit: Payer: Medicare Other | Admitting: Nurse Practitioner

## 2019-10-24 ENCOUNTER — Telehealth: Payer: Self-pay | Admitting: Internal Medicine

## 2019-10-24 DIAGNOSIS — R002 Palpitations: Secondary | ICD-10-CM | POA: Diagnosis not present

## 2019-10-24 DIAGNOSIS — K8050 Calculus of bile duct without cholangitis or cholecystitis without obstruction: Secondary | ICD-10-CM | POA: Diagnosis not present

## 2019-10-24 DIAGNOSIS — I480 Paroxysmal atrial fibrillation: Secondary | ICD-10-CM | POA: Diagnosis not present

## 2019-10-24 DIAGNOSIS — G4733 Obstructive sleep apnea (adult) (pediatric): Secondary | ICD-10-CM | POA: Diagnosis not present

## 2019-10-24 NOTE — Telephone Encounter (Signed)
Patient is going to get at the pharmacy

## 2019-10-24 NOTE — Telephone Encounter (Signed)
Patient called in wanted to get a shingle shot wanted to know if she should get it her in the lab or the pharmacy

## 2019-11-17 DIAGNOSIS — R002 Palpitations: Secondary | ICD-10-CM | POA: Diagnosis not present

## 2019-11-19 DIAGNOSIS — R002 Palpitations: Secondary | ICD-10-CM | POA: Diagnosis not present

## 2019-11-20 DIAGNOSIS — D2261 Melanocytic nevi of right upper limb, including shoulder: Secondary | ICD-10-CM | POA: Diagnosis not present

## 2019-11-20 DIAGNOSIS — Z85828 Personal history of other malignant neoplasm of skin: Secondary | ICD-10-CM | POA: Diagnosis not present

## 2019-11-20 DIAGNOSIS — D2262 Melanocytic nevi of left upper limb, including shoulder: Secondary | ICD-10-CM | POA: Diagnosis not present

## 2019-11-20 DIAGNOSIS — D225 Melanocytic nevi of trunk: Secondary | ICD-10-CM | POA: Diagnosis not present

## 2019-11-25 DIAGNOSIS — G4733 Obstructive sleep apnea (adult) (pediatric): Secondary | ICD-10-CM | POA: Diagnosis not present

## 2019-12-03 DIAGNOSIS — H40153 Residual stage of open-angle glaucoma, bilateral: Secondary | ICD-10-CM | POA: Diagnosis not present

## 2020-01-06 ENCOUNTER — Other Ambulatory Visit: Payer: Self-pay | Admitting: Internal Medicine

## 2020-03-09 ENCOUNTER — Other Ambulatory Visit (INDEPENDENT_AMBULATORY_CARE_PROVIDER_SITE_OTHER): Payer: Medicare HMO

## 2020-03-09 ENCOUNTER — Other Ambulatory Visit: Payer: Self-pay

## 2020-03-09 DIAGNOSIS — E7800 Pure hypercholesterolemia, unspecified: Secondary | ICD-10-CM | POA: Diagnosis not present

## 2020-03-09 DIAGNOSIS — R739 Hyperglycemia, unspecified: Secondary | ICD-10-CM | POA: Diagnosis not present

## 2020-03-09 LAB — BASIC METABOLIC PANEL
BUN: 19 mg/dL (ref 6–23)
CO2: 29 mEq/L (ref 19–32)
Calcium: 9.5 mg/dL (ref 8.4–10.5)
Chloride: 101 mEq/L (ref 96–112)
Creatinine, Ser: 0.85 mg/dL (ref 0.40–1.20)
GFR: 67.95 mL/min (ref >60.00)
Glucose, Bld: 101 mg/dL — ABNORMAL HIGH (ref 70–99)
Potassium: 4.2 mEq/L (ref 3.5–5.1)
Sodium: 137 mEq/L (ref 135–145)

## 2020-03-09 LAB — LIPID PANEL
Cholesterol: 156 mg/dL (ref 0–200)
HDL: 47.2 mg/dL (ref >39.00)
LDL Cholesterol: 78 mg/dL (ref 0–99)
NonHDL: 108.35
Total CHOL/HDL Ratio: 3
Triglycerides: 152 mg/dL — ABNORMAL HIGH (ref 0.0–149.0)
VLDL: 30.4 mg/dL (ref 0.0–40.0)

## 2020-03-09 LAB — HEPATIC FUNCTION PANEL
ALT: 15 U/L (ref 0–35)
AST: 19 U/L (ref 0–37)
Albumin: 4.4 g/dL (ref 3.5–5.2)
Alkaline Phosphatase: 50 U/L (ref 39–117)
Bilirubin, Direct: 0.1 mg/dL (ref 0.0–0.3)
Total Bilirubin: 0.8 mg/dL (ref 0.2–1.2)
Total Protein: 7.1 g/dL (ref 6.0–8.3)

## 2020-03-09 LAB — HEMOGLOBIN A1C: Hgb A1c MFr Bld: 5.6 % (ref 4.6–6.5)

## 2020-03-11 ENCOUNTER — Ambulatory Visit (INDEPENDENT_AMBULATORY_CARE_PROVIDER_SITE_OTHER): Payer: Medicare HMO

## 2020-03-11 ENCOUNTER — Other Ambulatory Visit: Payer: Self-pay

## 2020-03-11 ENCOUNTER — Encounter: Payer: Self-pay | Admitting: Internal Medicine

## 2020-03-11 ENCOUNTER — Ambulatory Visit (INDEPENDENT_AMBULATORY_CARE_PROVIDER_SITE_OTHER): Payer: Medicare HMO | Admitting: Internal Medicine

## 2020-03-11 VITALS — BP 120/62 | HR 81 | Temp 97.9°F | Resp 16 | Ht 66.0 in | Wt 140.0 lb

## 2020-03-11 DIAGNOSIS — M549 Dorsalgia, unspecified: Secondary | ICD-10-CM | POA: Insufficient documentation

## 2020-03-11 DIAGNOSIS — R002 Palpitations: Secondary | ICD-10-CM

## 2020-03-11 DIAGNOSIS — M545 Low back pain, unspecified: Secondary | ICD-10-CM

## 2020-03-11 DIAGNOSIS — E7800 Pure hypercholesterolemia, unspecified: Secondary | ICD-10-CM | POA: Diagnosis not present

## 2020-03-11 DIAGNOSIS — R739 Hyperglycemia, unspecified: Secondary | ICD-10-CM | POA: Diagnosis not present

## 2020-03-11 DIAGNOSIS — G4733 Obstructive sleep apnea (adult) (pediatric): Secondary | ICD-10-CM | POA: Diagnosis not present

## 2020-03-11 DIAGNOSIS — Z0000 Encounter for general adult medical examination without abnormal findings: Secondary | ICD-10-CM

## 2020-03-11 NOTE — Assessment & Plan Note (Signed)
Physical today 03/11/20.  Mammogram 8/12 21 - recommended f/u right mammogram.  Right mammogram 09/11/19 - Birads I.  colonosocpy 08/2016.

## 2020-03-11 NOTE — Progress Notes (Signed)
Patient ID: Dawn Wolfe, female   DOB: Mar 16, 1945, 75 y.o.   MRN: [id number]   Subjective:    Patient ID: Dawn Wolfe, female    DOB: September 19, 1945, 75 y.o.   MRN: [id number]  HPI This visit occurred during the SARS-CoV-2 public health emergency.  Safety protocols were in place, including screening questions prior to the visit, additional usage of staff PPE, and extensive cleaning of exam room while observing appropriate contact time as indicated for disinfecting solutions.  Patient here for her physical exam.  She is accompanied by her husband.  Reports she has been doing relatively well.  Stays active.  No chest pain or sob with increased activity or exertion.  Eating.  Trying to watch her diet.  Has been eating more bread.  No abdominal pain reported.  Bowels moving.  No further fluttering sensations since stopping flonase.  Having right lower back pain.  Increased pain. No known injury.  Has been moving heavy furniture.     Past Medical History:  Diagnosis Date  . Anemia    h/o as a child  . Arthritis    left shoulder  . Cancer ([place]) 15 yrs ago   [employer] on the face. Surgical removal  . Complication of anesthesia   . Hemorrhoids   . History of colonoscopy 2003   Normal, Dr. Sammuel Cooper, [place]   . Hypercholesterolemia   . Normal exercise sestamibi stress test 2008   [facility] Fath  . Obstructive sleep apnea on CPAP 2007  . PONV (postoperative nausea and vomiting)   . Rotator cuff tear, left   . Sleep apnea    Past Surgical History:  Procedure Laterality Date  . ABDOMINALPLASTY W/ LIPOSUCTION  AGE 3  . BUNIONECTOMY  X3  LAST ONE 2003  . CHOLECYSTECTOMY N/A 04/19/2015   Procedure: LAPAROSCOPIC CHOLECYSTECTOMY WITH INTRAOPERATIVE CHOLANGIOGRAM;  Surgeon: Robert Bellow, MD;  Location: [facility] ORS;  Service: General;  Laterality: N/A;  . COLONOSCOPY  2012   Dr Wyline Mood in [place]  . COLONOSCOPY WITH PROPOFOL N/A 09/11/2016   Procedure: COLONOSCOPY WITH PROPOFOL;  Surgeon:  Lollie Sails, MD;  Location: [facility] ENDOSCOPY;  Service: Endoscopy;  Laterality: N/A;  . ERCP N/A 04/29/2015   Procedure: ENDOSCOPIC RETROGRADE CHOLANGIOPANCREATOGRAPHY (ERCP);  Surgeon: Hulen Luster, MD;  Location: [facility] ENDOSCOPY;  Service: Gastroenterology;  Laterality: N/A;  . EYE SURGERY     cataract extraction  . NASAL SEPTUM SURGERY  AGE 26  . NASAL SINUS SURGERY  AGE 5  . SHOULDER ARTHROSCOPY  10/25/2011   Procedure: ARTHROSCOPY SHOULDER;  Surgeon: Magnus Sinning, MD;  Location: [facility];  Service: Orthopedics;  Laterality: Left;  with SAD, shave of the labrium   . TUBAL LIGATION  1982   Family History  Problem Relation Age of Onset  . Acute myelogenous leukemia Sister   . Polycythemia Sister 47  . Emphysema Father        smoker  . Heart disease Father        myocardial infarction - 74  . COPD Father   . Heart attack Father   . Breast cancer Paternal Aunt   . Testicular cancer Brother   . Heart disease Mother   . Emphysema Mother   . COPD Mother   . Asthma Mother   . Heart attack Mother   . Leukemia Sister   . Colon cancer Neg Hx    Social History   Socioeconomic History  . Marital status: Married  Spouse name: Not on file  . Number of children: Not on file  . Years of education: Not on file  . Highest education level: Not on file  Occupational History  . Not on file  Tobacco Use  . Smoking status: Former Smoker    Packs/day: 2.50    Years: 10.00    Pack years: 25.00    Types: Cigarettes    Quit date: 03/28/1977    Years since quitting: 42.9  . Smokeless tobacco: Never Used  Substance and Sexual Activity  . Alcohol use: No    Alcohol/week: 0.0 standard drinks  . Drug use: No  . Sexual activity: Not Currently    Birth control/protection: Post-menopausal  Other Topics Concern  . Not on file  Social History Narrative  . Not on file   Social Determinants of Health   Financial Resource Strain: Not on file  Food Insecurity: Not  on file  Transportation Needs: Not on file  Physical Activity: Not on file  Stress: Not on file  Social Connections: Not on file    Outpatient Encounter Medications as of 03/11/2020  Medication Sig  . Multiple Vitamin (MULTIVITAMIN) capsule Take 1 capsule by mouth daily.  Marland Kitchen aspirin EC 81 MG tablet Take 81 mg by mouth daily.  Marland Kitchen latanoprost (XALATAN) 0.005 % ophthalmic solution Place 1 drop into both eyes.  . pravastatin (PRAVACHOL) 10 MG tablet TAKE 1 TABLET BY MOUTH DAILY  . [DISCONTINUED] fluticasone (FLONASE) 50 MCG/ACT nasal spray Place 2 sprays into both nostrils as needed.  . [DISCONTINUED] saccharomyces boulardii (FLORASTOR) 250 MG capsule Take by mouth.   No facility-administered encounter medications on file as of 03/11/2020.    Review of Systems  Constitutional: Negative for appetite change and unexpected weight change.  HENT: Negative for congestion, sinus pressure and sore throat.   Eyes: Negative for pain and visual disturbance.  Respiratory: Negative for cough, chest tightness and shortness of breath.   Cardiovascular: Negative for chest pain, palpitations and leg swelling.  Gastrointestinal: Negative for abdominal pain, diarrhea, nausea and vomiting.  Genitourinary: Negative for difficulty urinating and dysuria.  Musculoskeletal: Positive for back pain. Negative for joint swelling and myalgias.  Skin: Negative for color change and rash.  Neurological: Negative for dizziness, light-headedness and headaches.  Hematological: Negative for adenopathy. Does not bruise/bleed easily.  Psychiatric/Behavioral: Negative for agitation, decreased concentration and dysphoric mood.       Objective:    Physical Exam Vitals reviewed.  Constitutional:      General: She is not in acute distress.    Appearance: Normal appearance. She is well-developed and well-nourished.  HENT:     Head: Normocephalic and atraumatic.     Right Ear: External ear normal.     Left Ear: External ear  normal.     Mouth/Throat:     Mouth: Oropharynx is clear and moist.  Eyes:     General: No scleral icterus.       Right eye: No discharge.        Left eye: No discharge.     Conjunctiva/sclera: Conjunctivae normal.  Neck:     Thyroid: No thyromegaly.  Cardiovascular:     Rate and Rhythm: Normal rate and regular rhythm.  Pulmonary:     Effort: No tachypnea, accessory muscle usage or respiratory distress.     Breath sounds: Normal breath sounds. No decreased breath sounds or wheezing.  Chest:  Breasts:     Right: No inverted nipple, mass, nipple discharge or tenderness (no  axillary adenopathy).     Left: No inverted nipple, mass, nipple discharge or tenderness (no axilarry adenopathy).    Abdominal:     General: Bowel sounds are normal.     Palpations: Abdomen is soft.     Tenderness: There is no abdominal tenderness.  Musculoskeletal:        General: No swelling, tenderness or edema.     Cervical back: Neck supple. No tenderness.     Comments: No pain with SLR.  No pain with abduction/adduction lower extremity.   Lymphadenopathy:     Cervical: No cervical adenopathy.  Skin:    General: Skin is warm.     Findings: No erythema or rash.  Neurological:     Mental Status: She is alert and oriented to person, place, and time.  Psychiatric:        Mood and Affect: Mood and affect and mood normal.        Behavior: Behavior normal.     BP 120/62   Pulse 81   Temp 97.9 F (36.6 C) (Oral)   Resp 16   Ht 5' 6"  (1.676 m)   Wt 140 lb (63.5 kg)   SpO2 99%   BMI 22.60 kg/m  Wt Readings from Last 3 Encounters:  03/11/20 140 lb (63.5 kg)  09/01/19 137 lb (62.1 kg)  05/02/19 138 lb 6.4 oz (62.8 kg)     Lab Results  Component Value Date   WBC 5.4 08/28/2019   HGB 12.9 08/28/2019   HCT 37.3 08/28/2019   PLT 151.0 08/28/2019   GLUCOSE 101 (H) 03/09/2020   CHOL 156 03/09/2020   TRIG 152.0 (H) 03/09/2020   HDL 47.20 03/09/2020   LDLDIRECT 88.0 07/22/2014   LDLCALC 78  03/09/2020   ALT 15 03/09/2020   AST 19 03/09/2020   NA 137 03/09/2020   K 4.2 03/09/2020   CL 101 03/09/2020   CREATININE 0.85 03/09/2020   BUN 19 03/09/2020   CO2 29 03/09/2020   TSH 2.93 08/28/2019   HGBA1C 5.6 03/09/2020    MM DIAG BREAST TOMO UNI RIGHT  Result Date: 09/11/2019 CLINICAL DATA:  Patient recalled from screening for right breast asymmetry. EXAM: DIGITAL DIAGNOSTIC UNILATERAL RIGHT MAMMOGRAM WITH TOMO AND CAD COMPARISON:  Previous exam(s). ACR Breast Density Category c: The breast tissue is heterogeneously dense, which may obscure small masses. FINDINGS: Questioned asymmetry within the right breast resolved with additional imaging compatible with dense overlapping fibroglandular tissue. No suspicious findings on additional imaging. Mammographic images were processed with CAD. IMPRESSION: No mammographic evidence for malignancy. RECOMMENDATION: Screening mammogram in one year.(Code:[id number]-B-01Y) I have discussed the findings and recommendations with the patient. If applicable, a reminder letter will be sent to the patient regarding the next appointment. BI-RADS CATEGORY  1: Negative. Electronically Signed   By: Lovey Newcomer M.D.   On: 09/11/2019 11:50       Assessment & Plan:   Problem List Items Addressed This Visit    Back pain    Right lower back pain as outlined.  Check xray.  Can take tylenol.  Follow.        Relevant Orders   DG Lumbar Spine 2-3 Views (Completed)   Fluttering sensation of heart    Not an issue now after stopping flonase.        Health care maintenance    Physical today 03/11/20.  Mammogram 8/12 21 - recommended f/u right mammogram.  Right mammogram 09/11/19 - Birads I.  colonosocpy 08/2016.  Hypercholesteremia    On pravastatin.  Low cholesterol diet and exercise.  Follow lipid panel and liver function tests.   Lab Results  Component Value Date   CHOL 156 03/09/2020   HDL 47.20 03/09/2020   LDLCALC 78 03/09/2020   LDLDIRECT 88.0 07/22/2014    TRIG 152.0 (H) 03/09/2020   CHOLHDL 3 03/09/2020        Relevant Orders   Lipid panel   Hepatic function panel   Basic metabolic panel   Hyperglycemia    Continues to watch diet.  Follow met b and a1c.   Lab Results  Component Value Date   HGBA1C 5.6 03/09/2020        Relevant Orders   Hemoglobin A1c   Obstructive sleep apnea    CPAP.        Other Visit Diagnoses    Routine general medical examination at a health care facility    -  Primary       Einar Pheasant, MD

## 2020-03-13 ENCOUNTER — Encounter: Payer: Self-pay | Admitting: Internal Medicine

## 2020-03-13 NOTE — Assessment & Plan Note (Signed)
Not an issue now after stopping flonase.

## 2020-03-13 NOTE — Assessment & Plan Note (Signed)
On pravastatin.  Low cholesterol diet and exercise.  Follow lipid panel and liver function tests.   Lab Results  Component Value Date   CHOL 156 03/09/2020   HDL 47.20 03/09/2020   LDLCALC 78 03/09/2020   LDLDIRECT 88.0 07/22/2014   TRIG 152.0 (H) 03/09/2020   CHOLHDL 3 03/09/2020

## 2020-03-13 NOTE — Assessment & Plan Note (Signed)
Right lower back pain as outlined.  Check xray.  Can take tylenol.  Follow.

## 2020-03-13 NOTE — Assessment & Plan Note (Signed)
CPAP.  

## 2020-03-13 NOTE — Assessment & Plan Note (Signed)
Continues to watch diet.  Follow met b and a1c.   Lab Results  Component Value Date   HGBA1C 5.6 03/09/2020

## 2020-03-29 DIAGNOSIS — G4733 Obstructive sleep apnea (adult) (pediatric): Secondary | ICD-10-CM | POA: Diagnosis not present

## 2020-03-31 DIAGNOSIS — H40153 Residual stage of open-angle glaucoma, bilateral: Secondary | ICD-10-CM | POA: Diagnosis not present

## 2020-04-07 ENCOUNTER — Telehealth: Payer: Self-pay

## 2020-04-07 MED ORDER — PRAVASTATIN SODIUM 10 MG PO TABS: 10.0000 mg | ORAL_TABLET | Freq: Every day | ORAL | 1 refills | Status: DC

## 2020-04-07 NOTE — Telephone Encounter (Signed)
Pt needs to change pharmacy to [facility] on [address]. She also needs a refill sent there for pravastatin (PRAVACHOL) 10 MG tablet.

## 2020-05-05 ENCOUNTER — Ambulatory Visit (INDEPENDENT_AMBULATORY_CARE_PROVIDER_SITE_OTHER): Payer: Medicare HMO

## 2020-05-05 VITALS — BP 126/69 | HR 85 | Ht 66.0 in | Wt 137.8 lb

## 2020-05-05 DIAGNOSIS — Z0000 Encounter for general adult medical examination without abnormal findings: Secondary | ICD-10-CM | POA: Diagnosis not present

## 2020-05-05 NOTE — Patient Instructions (Addendum)
Dawn Wolfe , Thank you for taking time to come for your Medicare Wellness Visit. I appreciate your ongoing commitment to your health goals. Please review the following plan we discussed and let me know if I can assist you in the future.   These are the goals we discussed: Goals      Patient Stated   .  Maintain Weight (pt-stated)      Stay active Stay hydrated Healthy diet       This is a list of the screening recommended for you and due dates:  Health Maintenance  Topic Date Due  . Mammogram  08/27/2021  . Tetanus Vaccine  02/23/2025  . Colon Cancer Screening  09/12/2026  . DEXA scan (bone density measurement)  Completed  . COVID-19 Vaccine  Completed  .  Hepatitis C: One time screening is recommended by Center for Disease Control  (CDC) for  adults born from [birth year] through 1964.   Completed  . Pneumonia vaccines  Completed  . HPV Vaccine  Aged Out  . Flu Shot  Discontinued   Keep all routine maintenance appointments.   Next scheduled lab 09/06/20 @ 10:00  Follow up 07/09/20 @ 8:30  Advanced directives: End of life planning; Advance aging; Advanced directives discussed.  Copy of current HCPOA/Living Will requested.    Conditions/risks identified: none new  Follow up in one year for your annual wellness visit   Preventive Care 65 Years and Older, Female Preventive care refers to lifestyle choices and visits with your health care provider that can promote health and wellness. What does preventive care include?  A yearly physical exam. This is also called an annual well check.  Dental exams once or twice a year.  Routine eye exams. Ask your health care provider how often you should have your eyes checked.  Personal lifestyle choices, including:  Daily care of your teeth and gums.  Regular physical activity.  Eating a healthy diet.  Avoiding tobacco and drug use.  Limiting alcohol use.  Practicing safe sex.  Taking low-dose aspirin every day.  Taking  vitamin and mineral supplements as recommended by your health care provider. What happens during an annual well check? The services and screenings done by your health care provider during your annual well check will depend on your age, overall health, lifestyle risk factors, and family history of disease. Counseling  Your health care provider may ask you questions about your:  Alcohol use.  Tobacco use.  Drug use.  Emotional well-being.  Home and relationship well-being.  Sexual activity.  Eating habits.  History of falls.  Memory and ability to understand (cognition).  Work and work Statistician.  Reproductive health. Screening  You may have the following tests or measurements:  Height, weight, and BMI.  Blood pressure.  Lipid and cholesterol levels. These may be checked every 5 years, or more frequently if you are over 83 years old.  Skin check.  Lung cancer screening. You may have this screening every year starting at age 62 if you have a 30-pack-year history of smoking and currently smoke or have quit within the past 15 years.  Fecal occult blood test (FOBT) of the stool. You may have this test every year starting at age 3.  Flexible sigmoidoscopy or colonoscopy. You may have a sigmoidoscopy every 5 years or a colonoscopy every 10 years starting at age 76.  Hepatitis C blood test.  Hepatitis B blood test.  Sexually transmitted disease (STD) testing.  Diabetes screening. This is  done by checking your blood sugar (glucose) after you have not eaten for a while (fasting). You may have this done every 1-3 years.  Bone density scan. This is done to screen for osteoporosis. You may have this done starting at age 34.  Mammogram. This may be done every 1-2 years. Talk to your health care provider about how often you should have regular mammograms. Talk with your health care provider about your test results, treatment options, and if necessary, the need for more  tests. Vaccines  Your health care provider may recommend certain vaccines, such as:  Influenza vaccine. This is recommended every year.  Tetanus, diphtheria, and acellular pertussis (Tdap, Td) vaccine. You may need a Td booster every 10 years.  Zoster vaccine. You may need this after age 41.  Pneumococcal 13-valent conjugate (PCV13) vaccine. One dose is recommended after age 47.  Pneumococcal polysaccharide (PPSV23) vaccine. One dose is recommended after age 10. Talk to your health care provider about which screenings and vaccines you need and how often you need them. This information is not intended to replace advice given to you by your health care provider. Make sure you discuss any questions you have with your health care provider. Document Released: 01/29/2015 Document Revised: 09/22/2015 Document Reviewed: 11/03/2014 Elsevier Interactive Patient Education  2017 [place] Prevention in the Home Falls can cause injuries. They can happen to people of all ages. There are many things you can do to make your home safe and to help prevent falls. What can I do on the outside of my home?  Regularly fix the edges of walkways and driveways and fix any cracks.  Remove anything that might make you trip as you walk through a door, such as a raised step or threshold.  Trim any bushes or trees on the path to your home.  Use bright outdoor lighting.  Clear any walking paths of anything that might make someone trip, such as rocks or tools.  Regularly check to see if handrails are loose or broken. Make sure that both sides of any steps have handrails.  Any raised decks and porches should have guardrails on the edges.  Have any leaves, snow, or ice cleared regularly.  Use sand or salt on walking paths during winter.  Clean up any spills in your garage right away. This includes oil or grease spills. What can I do in the bathroom?  Use night lights.  Install grab bars by the  toilet and in the tub and shower. Do not use towel bars as grab bars.  Use non-skid mats or decals in the tub or shower.  If you need to sit down in the shower, use a plastic, non-slip stool.  Keep the floor dry. Clean up any water that spills on the floor as soon as it happens.  Remove soap buildup in the tub or shower regularly.  Attach bath mats securely with double-sided non-slip rug tape.  Do not have throw rugs and other things on the floor that can make you trip. What can I do in the bedroom?  Use night lights.  Make sure that you have a light by your bed that is easy to reach.  Do not use any sheets or blankets that are too big for your bed. They should not hang down onto the floor.  Have a firm chair that has side arms. You can use this for support while you get dressed.  Do not have throw rugs and other things  on the floor that can make you trip. What can I do in the kitchen?  Clean up any spills right away.  Avoid walking on wet floors.  Keep items that you use a lot in easy-to-reach places.  If you need to reach something above you, use a strong step stool that has a grab bar.  Keep electrical cords out of the way.  Do not use floor polish or wax that makes floors slippery. If you must use wax, use non-skid floor wax.  Do not have throw rugs and other things on the floor that can make you trip. What can I do with my stairs?  Do not leave any items on the stairs.  Make sure that there are handrails on both sides of the stairs and use them. Fix handrails that are broken or loose. Make sure that handrails are as long as the stairways.  Check any carpeting to make sure that it is firmly attached to the stairs. Fix any carpet that is loose or worn.  Avoid having throw rugs at the top or bottom of the stairs. If you do have throw rugs, attach them to the floor with carpet tape.  Make sure that you have a light switch at the top of the stairs and the bottom of  the stairs. If you do not have them, ask someone to add them for you. What else can I do to help prevent falls?  Wear shoes that:  Do not have high heels.  Have rubber bottoms.  Are comfortable and fit you well.  Are closed at the toe. Do not wear sandals.  If you use a stepladder:  Make sure that it is fully opened. Do not climb a closed stepladder.  Make sure that both sides of the stepladder are locked into place.  Ask someone to hold it for you, if possible.  Clearly mark and make sure that you can see:  Any grab bars or handrails.  First and last steps.  Where the edge of each step is.  Use tools that help you move around (mobility aids) if they are needed. These include:  Canes.  Walkers.  Scooters.  Crutches.  Turn on the lights when you go into a dark area. Replace any light bulbs as soon as they burn out.  Set up your furniture so you have a clear path. Avoid moving your furniture around.  If any of your floors are uneven, fix them.  If there are any pets around you, be aware of where they are.  Review your medicines with your doctor. Some medicines can make you feel dizzy. This can increase your chance of falling. Ask your doctor what other things that you can do to help prevent falls. This information is not intended to replace advice given to you by your health care provider. Make sure you discuss any questions you have with your health care provider. Document Released: 10/29/2008 Document Revised: 06/10/2015 Document Reviewed: 02/06/2014 Elsevier Interactive Patient Education  2017 [employer].

## 2020-05-05 NOTE — Progress Notes (Signed)
Subjective:   Dawn Wolfe is a 75 y.o. female who presents for Medicare Annual (Subsequent) preventive examination.  Review of Systems    No ROS.  Medicare Wellness Virtual Visit.   Cardiac Risk Factors include: advanced age (>13men, >61 women)     Objective:    Today's Vitals   05/05/20 1033  BP: 126/69  Pulse: 85  Weight: 137 lb 12.8 oz (62.5 kg)  Height: 5\' 6"  (1.676 m)   Body mass index is 22.24 kg/m.  Advanced Directives 05/05/2020 05/02/2019 05/01/2018 09/11/2016 04/29/2015 11/26/2014 10/25/2011  Does Patient Have a Medical Advance Directive? Yes Yes Yes Yes Yes Yes Patient has advance directive, copy not in chart  Type of Advance Directive [place];Living will Healthcare Power of [place];Living will [place];Living will [place];Living will Living will;Healthcare Power of Attorney Living will;Healthcare Power of Attorney  Does patient want to make changes to medical advance directive? No - Patient declined No - Patient declined No - Patient declined - - No - Patient declined -  Copy of [place] in Chart? No - copy requested No - copy requested No - copy requested No - copy requested No - copy requested No - copy requested Copy requested from family  Pre-existing out of facility DNR order (yellow form or pink MOST form) - - - - - - No    Current Medications (verified) Outpatient Encounter Medications as of 05/05/2020  Medication Sig  . aspirin EC 81 MG tablet Take 81 mg by mouth daily.  Marland Kitchen latanoprost (XALATAN) 0.005 % ophthalmic solution Place 1 drop into both eyes.  . Multiple Vitamin (MULTIVITAMIN) capsule Take 1 capsule by mouth daily.  . pravastatin (PRAVACHOL) 10 MG tablet Take 1 tablet (10 mg total) by mouth daily.   No facility-administered encounter medications on file as of 05/05/2020.    Allergies (verified) Codeine   History: Past Medical  History:  Diagnosis Date  . Anemia    h/o as a child  . Arthritis    left shoulder  . 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 Surgical removal  . Complication of anesthesia   . Hemorrhoids   . History of colonoscopy 2003   Normal, Dr. Sammuel Cooper, [place]   . Hypercholesterolemia   . Normal exercise sestamibi stress test 2008   Ken Fath  . Obstructive sleep apnea on CPAP 2007  . PONV (postoperative nausea and vomiting)   . Rotator cuff tear, left   . Sleep apnea    Past Surgical History:  Procedure Laterality Date  . ABDOMINALPLASTY W/ LIPOSUCTION  AGE 29  . BUNIONECTOMY  X3  LAST ONE 2003  . CHOLECYSTECTOMY N/A 04/19/2015   Procedure: LAPAROSCOPIC CHOLECYSTECTOMY WITH INTRAOPERATIVE CHOLANGIOGRAM;  Surgeon: Robert Bellow, MD;  Location: [facility] ORS;  Service: General;  Laterality: N/A;  . COLONOSCOPY  2012   Dr Wyline Mood in [place]  . COLONOSCOPY WITH PROPOFOL N/A 09/11/2016   Procedure: COLONOSCOPY WITH PROPOFOL;  Surgeon: Lollie Sails, MD;  Location: [facility] ENDOSCOPY;  Service: Endoscopy;  Laterality: N/A;  . ERCP N/A 04/29/2015   Procedure: ENDOSCOPIC RETROGRADE CHOLANGIOPANCREATOGRAPHY (ERCP);  Surgeon: Hulen Luster, MD;  Location: [facility]COPY;  Service: Gastroenterology;  Laterality: N/A;  . EYE SURGERY     cataract extraction  . NASAL SEPTUM SURGERY  AGE 53  . NASAL SINUS SURGERY  AGE 69  . SHOULDER ARTHROSCOPY  10/25/2011  Procedure: ARTHROSCOPY SHOULDER;  Surgeon: Magnus Sinning, MD;  Location: [facility];  Service: Orthopedics;  Laterality: Left;  with SAD, shave of the labrium   . TUBAL LIGATION  1982   Family History  Problem Relation Age of Onset  . Acute myelogenous leukemia Sister   . Polycythemia Sister 32  . Emphysema Father        smoker  . Heart disease Father        myocardial infarction - 40  . COPD Father   . Heart attack Father   . Breast cancer Paternal Aunt   . Testicular cancer Brother   . Heart disease Mother    . Emphysema Mother   . COPD Mother   . Asthma Mother   . Heart attack Mother   . Leukemia Sister   . Colon cancer Neg Hx    Social History   Socioeconomic History  . Marital status: Married    Spouse name: Not on file  . Number of children: Not on file  . Years of education: Not on file  . Highest education level: Not on file  Occupational History  . Not on file  Tobacco Use  . Smoking status: Former Smoker    Packs/day: 2.50    Years: 10.00    Pack years: 25.00    Types: Cigarettes    Quit date: 03/28/1977    Years since quitting: 43.1  . Smokeless tobacco: Never Used  Substance and Sexual Activity  . Alcohol use: No    Alcohol/week: 0.0 standard drinks  . Drug use: No  . Sexual activity: Not Currently    Birth control/protection: Post-menopausal  Other Topics Concern  . Not on file  Social History Narrative  . Not on file   Social Determinants of Health   Financial Resource Strain: Low Risk   . Difficulty of Paying Living Expenses: Not hard at all  Food Insecurity: No Food Insecurity  . Worried About Charity fundraiser in the Last Year: Never true  . Ran Out of Food in the Last Year: Never true  Transportation Needs: No Transportation Needs  . Lack of Transportation (Medical): No  . Lack of Transportation (Non-Medical): No  Physical Activity: Sufficiently Active  . Days of Exercise per Week: 5 days  . Minutes of Exercise per Session: 30 min  Stress: No Stress Concern Present  . Feeling of Stress : Not at all  Social Connections: Unknown  . Frequency of Communication with Friends and Family: Not on file  . Frequency of Social Gatherings with Friends and Family: Not on file  . Attends Religious Services: Not on file  . Active Member of Clubs or Organizations: Not on file  . Attends Archivist Meetings: Not on file  . Marital Status: Married    Tobacco Counseling Counseling given: Not Answered   Clinical Intake:  Pre-visit preparation  completed: Yes        Diabetes: No  How often do you need to have someone help you when you read instructions, pamphlets, or other written materials from your doctor or pharmacy?: 1 - Never   Interpreter Needed?: No      Activities of Daily Living In your present state of health, do you have any difficulty performing the following activities: 05/05/2020  Hearing? N  Vision? N  Difficulty concentrating or making decisions? N  Walking or climbing stairs? N  Dressing or bathing? N  Doing errands, shopping? N  Conservation officer, nature and  eating ? N  Using the Toilet? N  In the past six months, have you accidently leaked urine? N  Do you have problems with loss of bowel control? N  Managing your Medications? N  Managing your Finances? N  Housekeeping or managing your Housekeeping? N  Some recent data might be hidden    Patient Care Team: Einar Pheasant, MD as PCP - General (Internal Medicine) Einar Pheasant, MD (Internal Medicine) Bary Castilla Forest Gleason, MD (General Surgery)  Indicate any recent Medical Services you may have received from other than [facility] providers in the past year (date may be approximate).     Assessment:   This is a routine wellness examination for Dawn Wolfe.  I connected with Dawn Wolfe today by telephone and verified that I am speaking with the correct person using two identifiers. Location patient: home Location provider: work Persons participating in the virtual visit: patient, Marine scientist.    I discussed the limitations, risks, security and privacy concerns of performing an evaluation and management service by telephone and the availability of in person appointments. The patient expressed understanding and verbally consented to this telephonic visit.    Interactive audio and video telecommunications were attempted between this provider and patient, however failed, due to patient having technical difficulties OR patient did not have access to video capability.  We  continued and completed visit with audio only.  Some vital signs may be absent or patient reported.   Hearing/Vision screen  Hearing Screening   125Hz  250Hz  500Hz  1000Hz  2000Hz  3000Hz  4000Hz  6000Hz  8000Hz   Right ear:           Left ear:           Comments: Patient is able to hear conversational tones without difficulty. No issues reported.  Vision Screening Comments: Followed by Dr. Gloriann Loan Wears corrective lenses  Glaucoma suspect; drops in L eye Visual acuity not assessed, virtual visit.  Ophthalmologist visits every 3 months.   Dietary issues and exercise activities discussed: Current Exercise Habits: Home exercise routine, Intensity: Mild  Regular diet Good water intake  Goals      Patient Stated   .  Maintain Weight (pt-stated)      Stay active Stay hydrated Healthy diet      Depression Screen PHQ 2/9 Scores 05/05/2020 05/02/2019 05/01/2018 08/16/2017 08/10/2016 02/01/2015 11/26/2014  PHQ - 2 Score 0 0 0 0 0 0 0  PHQ- 9 Score - - - - 0 - -    Fall Risk Fall Risk  05/05/2020 05/02/2019 03/03/2019 05/01/2018 08/16/2017  Falls in the past year? 0 0 0 0 No  Number falls in past yr: 0 - - - -  Injury with Fall? 0 - - - -  Risk for fall due to : - - - - -  Risk for fall due to: Comment - - - - -  Follow up Falls evaluation completed Falls evaluation completed Falls evaluation completed - -    FALL RISK PREVENTION PERTAINING TO THE HOME: Handrails in use when climbing stairs? Yes Home free of loose throw rugs in walkways, pet beds, electrical cords, etc? Yes  Adequate lighting in your home to reduce risk of falls? Yes   ASSISTIVE DEVICES UTILIZED TO PREVENT FALLS: Life alert? No  Use of a cane, walker or w/c? No   TIMED UP AND GO: Was the test performed? No . Virtual visit.  Cognitive Function: Patient is alert and oriented x3.  Denies difficulty with focusing, making decisions, memory loss.  Enjoys playing  brain challenging games.  MMSE/6CIT deferred. Normal by direct  communication/observation.   MMSE - Mini Mental State Exam 11/26/2014  Orientation to time 5  Orientation to Place 5  Registration 3  Attention/ Calculation 5  Recall 3  Language- name 2 objects 2  Language- repeat 1  Language- follow 3 step command 3  Language- read & follow direction 1  Write a sentence 1  Copy design 1  Total score 30     6CIT Screen 05/02/2019 05/01/2018  What Year? 0 points 0 points  What month? 0 points 0 points  What time? - 0 points  Count back from 20 0 points 0 points  Months in reverse - 0 points  Repeat phrase - 0 points  Total Score - 0    Immunizations Immunization History  Administered Date(s) Administered  . Fluad Quad(high Dose 65+) 09/19/2019  . Influenza Split 10/26/2010, 10/16/2011, 10/21/2012, 08/22/2013  . Influenza, High Dose Seasonal PF 10/18/2016, 10/14/2018  . Influenza-Unspecified 10/08/2014, 10/20/2015, 10/24/2017  . PFIZER(Purple Top)SARS-COV-2 Vaccination 03/02/2019, 04/01/2019, 11/13/2019  . Pneumococcal Conjugate-13 11/26/2014  . Pneumococcal Polysaccharide-23 12/07/2015  . Tdap 02/24/2015  . Zoster 04/10/2013  . Zoster Recombinat (Shingrix) 03/08/2020    Qualifies for Shingles Vaccine? Yes   Zostavax completed No   Shingrix Completed?: Yes  Health Maintenance Health Maintenance  Topic Date Due  . MAMMOGRAM  08/27/2021  . TETANUS/TDAP  02/23/2025  . COLONOSCOPY (Pts 45-60yrs Insurance coverage will need to be confirmed)  09/12/2026  . DEXA SCAN  Completed  . COVID-19 Vaccine  Completed  . Hepatitis C Screening  Completed  . PNA vac Low Risk Adult  Completed  . HPV VACCINES  Aged Out  . INFLUENZA VACCINE  Discontinued   Colorectal cancer screening: Type of screening: Colonoscopy. Completed 09/11/16. Repeat every 10 years  Mammogram status: Completed 08/28/19. Repeat every year  Lung Cancer Screening: (Low Dose CT Chest recommended if Age 30-80 years, 30 pack-year currently smoking OR have quit w/in 15years.)  does not qualify.   Vision Screening: Recommended annual ophthalmology exams for early detection of glaucoma and other disorders of the eye. Is the patient up to date with their annual eye exam?  Yes   Dental Screening: Recommended annual dental exams for proper oral hygiene. Visits every 6 months.   Community Resource Referral / Chronic Care Management: CRR required this visit?  No   CCM required this visit?  No      Plan:   Keep all routine maintenance appointments.   Next scheduled lab 09/06/20 @ 10:00  Follow up 07/09/20 @ 8:30  I have personally reviewed and noted the following in the patient's chart:   . Medical and social history . Use of alcohol, tobacco or illicit drugs  . Current medications and supplements . Functional ability and status . Nutritional status . Physical activity . Advanced directives . List of other physicians . Hospitalizations, surgeries, and ER visits in previous 12 months . Vitals . Screenings to include cognitive, depression, and falls . Referrals and appointments  In addition, I have reviewed and discussed with patient certain preventive protocols, quality metrics, and best practice recommendations. A written personalized care plan for preventive services as well as general preventive health recommendations were provided to patient via mychart.     Varney Biles, LPN   [id number]

## 2020-07-09 ENCOUNTER — Encounter: Payer: Self-pay | Admitting: Internal Medicine

## 2020-07-09 ENCOUNTER — Ambulatory Visit (INDEPENDENT_AMBULATORY_CARE_PROVIDER_SITE_OTHER): Payer: Medicare HMO | Admitting: Internal Medicine

## 2020-07-09 ENCOUNTER — Ambulatory Visit (INDEPENDENT_AMBULATORY_CARE_PROVIDER_SITE_OTHER): Payer: Medicare HMO

## 2020-07-09 ENCOUNTER — Other Ambulatory Visit: Payer: Self-pay

## 2020-07-09 DIAGNOSIS — G4733 Obstructive sleep apnea (adult) (pediatric): Secondary | ICD-10-CM

## 2020-07-09 DIAGNOSIS — E7800 Pure hypercholesterolemia, unspecified: Secondary | ICD-10-CM

## 2020-07-09 DIAGNOSIS — M549 Dorsalgia, unspecified: Secondary | ICD-10-CM

## 2020-07-09 DIAGNOSIS — M47814 Spondylosis without myelopathy or radiculopathy, thoracic region: Secondary | ICD-10-CM | POA: Diagnosis not present

## 2020-07-09 DIAGNOSIS — R739 Hyperglycemia, unspecified: Secondary | ICD-10-CM

## 2020-07-09 DIAGNOSIS — M546 Pain in thoracic spine: Secondary | ICD-10-CM | POA: Diagnosis not present

## 2020-07-09 NOTE — Progress Notes (Signed)
Patient ID: Dawn Wolfe, female   DOB: 03-Dec-1945, 75 y.o.   MRN: [id number]   Subjective:    Patient ID: Dawn Wolfe, female    DOB: 07-25-1945, 75 y.o.   MRN: [id number]  HPI This visit occurred during the SARS-CoV-2 public health emergency.  Safety protocols were in place, including screening questions prior to the visit, additional usage of staff PPE, and extensive cleaning of exam room while observing appropriate contact time as indicated for disinfecting solutions.   Patient here for scheduled follow up.  Here to follow up regarding her cholesterol and blood sugar.  Has had issues with her upper back for months.  Increased pain - upper mid back.  Aggravated by weed eating, etc.  Did fall several weeks ago.  Working in yard.  Tripped. Fell back.  Hit back of her head - on grass.  No residual headache.  No dizziness or light headedness.  No chest pain reported.  Breathing stable.  No increased cough or congestion.  No increased abdominal pain reported.  No bowel issues reported.  No sick contacts.  No fever.  No nausea or vomiting.  Past Medical History:  Diagnosis Date   Anemia    h/o as a child   Arthritis    left shoulder   Cancer ([place]) 15 yrs ago   [employer] on the face. Surgical removal   Complication of anesthesia    Hemorrhoids    History of colonoscopy 2003   Normal, Dr. Sammuel Cooper, [place]    Hypercholesterolemia    Normal exercise sestamibi stress test 2008   Ken Fath   Obstructive sleep apnea on CPAP 2007   PONV (postoperative nausea and vomiting)    Rotator cuff tear, left    Sleep apnea    Past Surgical History:  Procedure Laterality Date   ABDOMINALPLASTY W/ LIPOSUCTION  AGE 68   BUNIONECTOMY  X3  LAST ONE 2003   CHOLECYSTECTOMY N/A 04/19/2015   Procedure: LAPAROSCOPIC CHOLECYSTECTOMY WITH INTRAOPERATIVE CHOLANGIOGRAM;  Surgeon: Robert Bellow, MD;  Location: [facility] ORS;  Service: General;  Laterality: N/A;   COLONOSCOPY  2012   Dr Wyline Mood in  [place] N/A 09/11/2016   Procedure: COLONOSCOPY WITH PROPOFOL;  Surgeon: Lollie Sails, MD;  Location: [facility] END[facility]COPY;  Service: Endoscopy;  Laterality: N/A;   ERCP N/A 04/29/2015   Procedure: ENDOSCOPIC RETROGRADE CHOLANGIOPANCREATOGRAPHY (ERCP);  Surgeon: Hulen Luster, MD;  Location: [facility] ENDOSCOPY;  Service: Gastroenterology;  Laterality: N/A;   EYE SURGERY     cataract extraction   NASAL SEPTUM SURGERY  AGE 27   NASAL SINUS SURGERY  AGE 29   SHOULDER ARTHROSCOPY  10/25/2011   Procedure: ARTHROSCOPY SHOULDER;  Surgeon: Magnus Sinning, MD;  Location: [place];  Service: Orthopedics;  Laterality: Left;  with SAD, shave of the labrium    TUBAL LIGATION  1982   Family History  Problem Relation Age of Onset   Acute myelogenous leukemia Sister    Polycythemia Sister 47   Emphysema Father        smoker   Heart disease Father        myocardial infarction - 29   COPD Father    Heart attack Father    Breast cancer Paternal Aunt    Testicular cancer Brother    Heart disease Mother    Emphysema Mother    COPD Mother    Asthma Mother    Heart attack Mother  Leukemia Sister    Colon cancer Neg Hx    Social History   Socioeconomic History   Marital status: Married    Spouse name: Not on file   Number of children: Not on file   Years of education: Not on file   Highest education level: Not on file  Occupational History   Not on file  Tobacco Use   Smoking status: Former    Packs/day: 2.50    Years: 10.00    Pack years: 25.00    Types: Cigarettes    Quit date: 03/28/1977    Years since quitting: 43.3   Smokeless tobacco: Never  Substance and Sexual Activity   Alcohol use: No    Alcohol/week: 0.0 standard drinks   Drug use: No   Sexual activity: Not Currently    Birth control/protection: Post-menopausal  Other Topics Concern   Not on file  Social History Narrative   Not on file   Social Determinants of Health    Financial Resource Strain: Low Risk    Difficulty of Paying Living Expenses: Not hard at all  Food Insecurity: No Food Insecurity   Worried About Running Out of Food in the Last Year: Never true   Ran Out of Food in the Last Year: Never true  Transportation Needs: No Transportation Needs   Lack of Transportation (Medical): No   Lack of Transportation (Non-Medical): No  Physical Activity: Sufficiently Active   Days of Exercise per Week: 5 days   Minutes of Exercise per Session: 30 min  Stress: No Stress Concern Present   Feeling of Stress : Not at all  Social Connections: Unknown   Frequency of Communication with Friends and Family: Not on file   Frequency of Social Gatherings with Friends and Family: Not on file   Attends Religious Services: Not on file   Active Member of Clubs or Organizations: Not on file   Attends Club or Organization Meetings: Not on file   Marital Status: Married     Review of Systems  Constitutional:  Negative for appetite change and unexpected weight change.  HENT:  Negative for congestion and sinus pressure.   Respiratory:  Negative for cough, chest tightness and shortness of breath.   Cardiovascular:  Negative for chest pain, palpitations and leg swelling.  Gastrointestinal:  Negative for abdominal pain, diarrhea, nausea and vomiting.  Genitourinary:  Negative for difficulty urinating and dysuria.  Musculoskeletal:  Negative for joint swelling and myalgias.  Skin:  Negative for color change and rash.  Neurological:  Negative for dizziness, light-headedness and headaches.  Psychiatric/Behavioral:  Negative for agitation and dysphoric mood.       Objective:    Physical Exam Vitals reviewed.  Constitutional:      General: She is not in acute distress.    Appearance: Normal appearance.  HENT:     Head: Normocephalic and atraumatic.     Right Ear: External ear normal.     Left Ear: External ear normal.  Eyes:     General: No scleral icterus.        Right eye: No discharge.        Left eye: No discharge.     Conjunctiva/sclera: Conjunctivae normal.  Neck:     Thyroid: No thyromegaly.  Cardiovascular:     Rate and Rhythm: Normal rate and regular rhythm.  Pulmonary:     Effort: No respiratory distress.     Breath sounds: Normal breath sounds. No wheezing.  Abdominal:       General: Bowel sounds are normal.     Palpations: Abdomen is soft.     Tenderness: There is no abdominal tenderness.  Musculoskeletal:        General: No swelling or tenderness.     Cervical back: Neck supple. No tenderness.  Lymphadenopathy:     Cervical: No cervical adenopathy.  Skin:    Findings: No erythema or rash.  Neurological:     Mental Status: She is alert.  Psychiatric:        Mood and Affect: Mood normal.        Behavior: Behavior normal.    BP 110/74 (BP Location: Left Arm, Patient Position: Sitting, Cuff Size: Normal)   Pulse 92   Temp 98.1 F (36.7 C) (Oral)   Ht 5' 6" (1.676 m)   Wt 136 lb 12.8 oz (62.1 kg)   SpO2 98%   BMI 22.08 kg/m  Wt Readings from Last 3 Encounters:  07/09/20 136 lb 12.8 oz (62.1 kg)  05/05/20 137 lb 12.8 oz (62.5 kg)  03/11/20 140 lb (63.5 kg)    Outpatient Encounter Medications as of 07/09/2020  Medication Sig   aspirin EC 81 MG tablet Take 81 mg by mouth daily.   latanoprost (XALATAN) 0.005 % ophthalmic solution Place 1 drop into both eyes.   Magnesium 500 MG TABS Take by mouth daily at 12 noon.   Multiple Vitamin (MULTIVITAMIN) capsule Take 1 capsule by mouth daily.   pravastatin (PRAVACHOL) 10 MG tablet Take 1 tablet (10 mg total) by mouth daily.   No facility-administered encounter medications on file as of 07/09/2020.     Lab Results  Component Value Date   WBC 5.4 08/28/2019   HGB 12.9 08/28/2019   HCT 37.3 08/28/2019   PLT 151.0 08/28/2019   GLUCOSE 101 (H) 03/09/2020   CHOL 156 03/09/2020   TRIG 152.0 (H) 03/09/2020   HDL 47.20 03/09/2020   LDLDIRECT 88.0 07/22/2014   LDLCALC 78  03/09/2020   ALT 15 03/09/2020   AST 19 03/09/2020   NA 137 03/09/2020   K 4.2 03/09/2020   CL 101 03/09/2020   CREATININE 0.85 03/09/2020   BUN 19 03/09/2020   CO2 29 03/09/2020   TSH 2.93 08/28/2019   HGBA1C 5.6 03/09/2020    MM DIAG BREAST TOMO UNI RIGHT  Result Date: 09/11/2019 CLINICAL DATA:  Patient recalled from screening for right breast asymmetry. EXAM: DIGITAL DIAGNOSTIC UNILATERAL RIGHT MAMMOGRAM WITH TOMO AND CAD COMPARISON:  Previous exam(s). ACR Breast Density Category c: The breast tissue is heterogeneously dense, which may obscure small masses. FINDINGS: Questioned asymmetry within the right breast resolved with additional imaging compatible with dense overlapping fibroglandular tissue. No suspicious findings on additional imaging. Mammographic images were processed with CAD. IMPRESSION: No mammographic evidence for malignancy. RECOMMENDATION: Screening mammogram in one year.(Code:SM-B-01Y) I have discussed the findings and recommendations with the patient. If applicable, a reminder letter will be sent to the patient regarding the next appointment. BI-RADS CATEGORY  1: Negative. Electronically Signed   By: Lovey Newcomer M.D.   On: 09/11/2019 11:50       Assessment & Plan:   Problem List Items Addressed This Visit     Hypercholesteremia    On pravastatin.  Low cholesterol diet and exercise.  Follow lipid panel and liver function tests.   Lab Results  Component Value Date   CHOL 156 03/09/2020   HDL 47.20 03/09/2020   LDLCALC 78 03/09/2020   LDLDIRECT 88.0 07/22/2014   TRIG 152.0 (H)  03/09/2020   CHOLHDL 3 03/09/2020        Relevant Orders   CBC with Differential/Platelet   TSH   Hyperglycemia    Continues to watch diet.  Follow met b and a1c.   Lab Results  Component Value Date   HGBA1C 5.6 03/09/2020        Obstructive sleep apnea    CPAP.        Upper back pain    Upper/mid back pain as outlined.  No worsening since fall.  Given persistence will  check thoracic spine xray and chest xray.  Tylenol.  Follow.        Relevant Orders   DG Thoracic Spine 2 View (Completed)   DG Chest 2 View (Completed)     Einar Pheasant, MD

## 2020-07-10 ENCOUNTER — Encounter: Payer: Self-pay | Admitting: Internal Medicine

## 2020-07-10 NOTE — Assessment & Plan Note (Signed)
Upper/mid back pain as outlined.  No worsening since fall.  Given persistence will check thoracic spine xray and chest xray.  Tylenol.  Follow.

## 2020-07-10 NOTE — Assessment & Plan Note (Signed)
CPAP.  

## 2020-07-10 NOTE — Assessment & Plan Note (Signed)
On pravastatin.  Low cholesterol diet and exercise.  Follow lipid panel and liver function tests.   Lab Results  Component Value Date   CHOL 156 03/09/2020   HDL 47.20 03/09/2020   LDLCALC 78 03/09/2020   LDLDIRECT 88.0 07/22/2014   TRIG 152.0 (H) 03/09/2020   CHOLHDL 3 03/09/2020

## 2020-07-10 NOTE — Assessment & Plan Note (Signed)
Continues to watch diet.  Follow met b and a1c.   Lab Results  Component Value Date   HGBA1C 5.6 03/09/2020

## 2020-07-20 IMAGING — MG DIGITAL SCREENING BILATERAL MAMMOGRAM WITH TOMO AND CAD
8 series · 8 of 24 positions shown · non-contrast
Comparison: Previous exam(s).

CLINICAL DATA: Screening.

EXAM:
DIGITAL SCREENING BILATERAL MAMMOGRAM WITH TOMO AND CAD

[L MLO synth-2D]
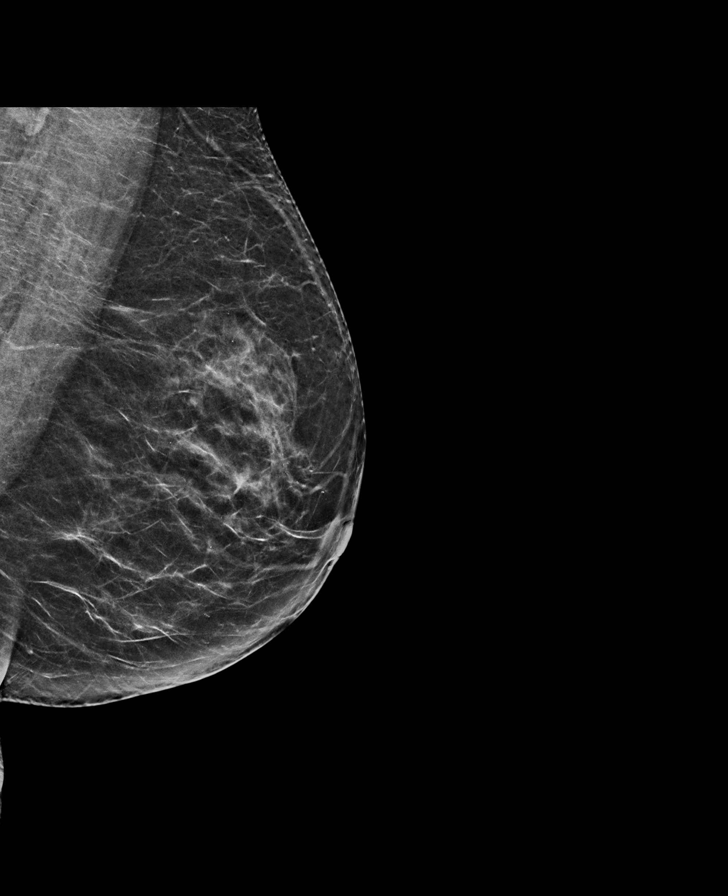

[L CC synth-2D]
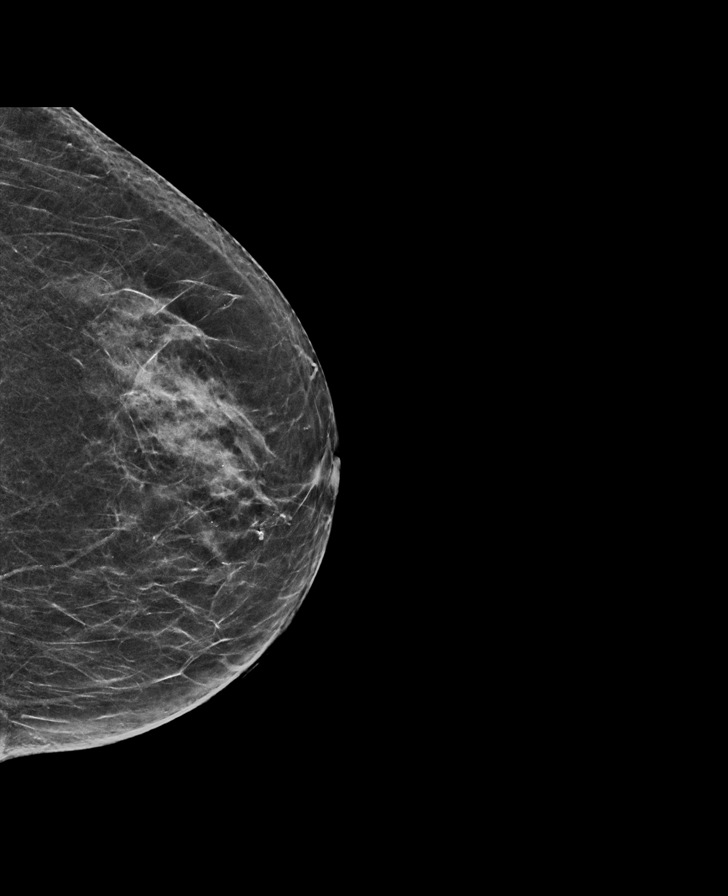

[R CC synth-2D]
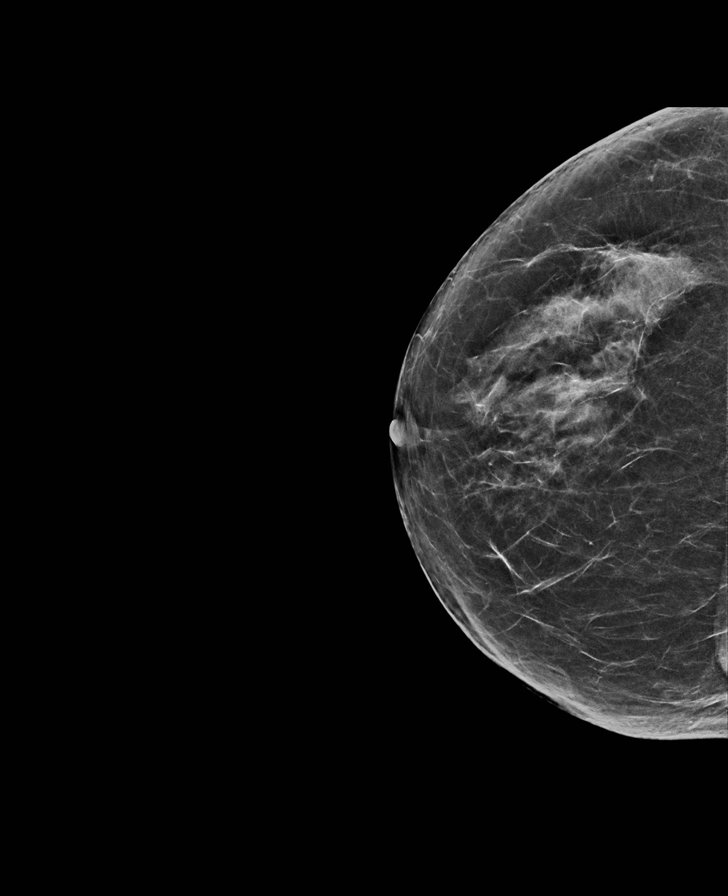

[R MLO synth-2D]
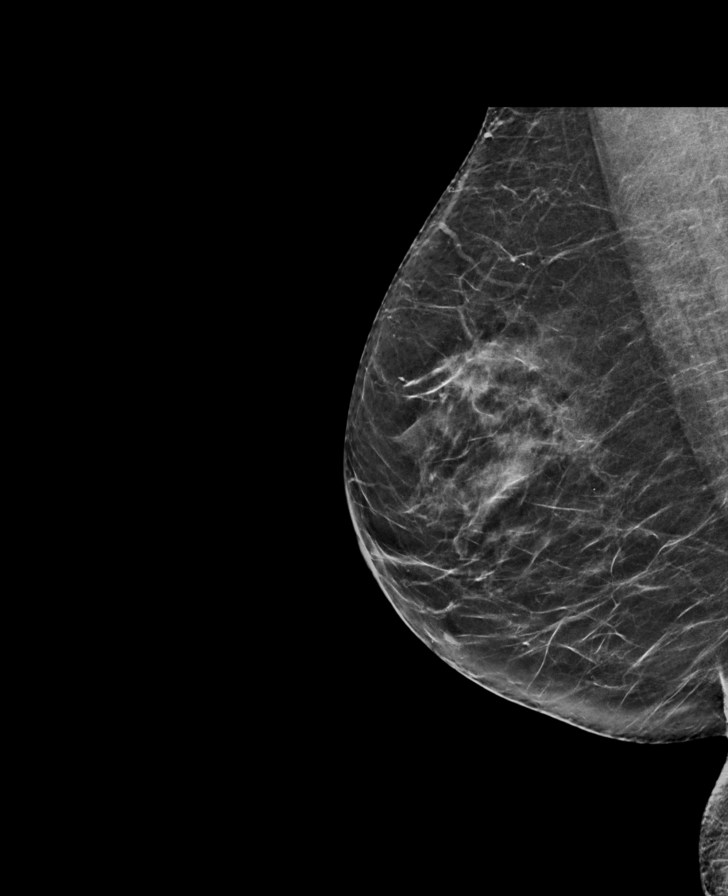

[R MLO tomo · tomo slice 32/63.0]
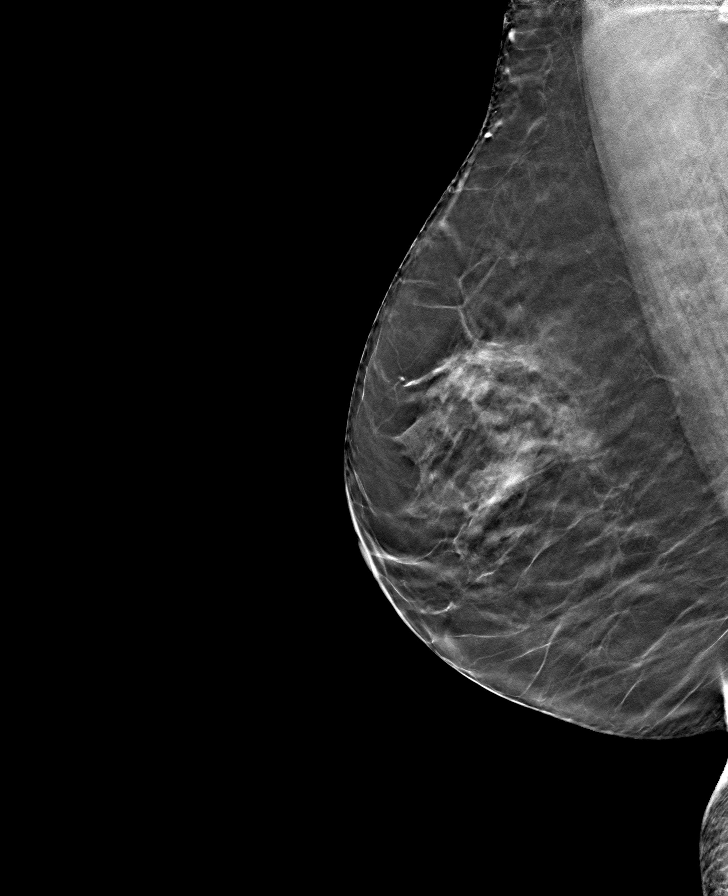

[L CC tomo · tomo slice 32/63.0]
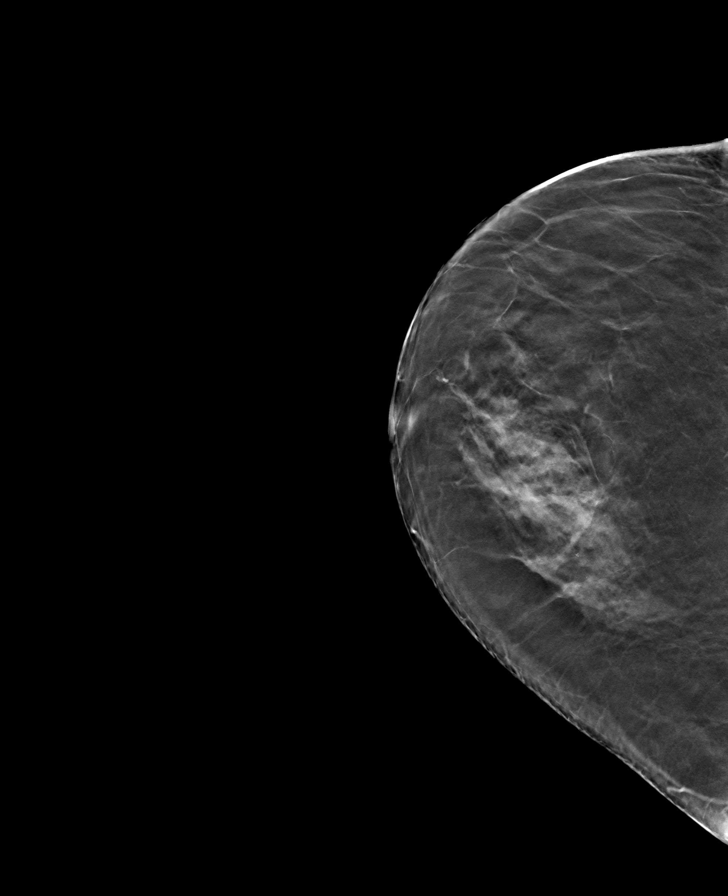

[R CC tomo · tomo slice 31/61.0]
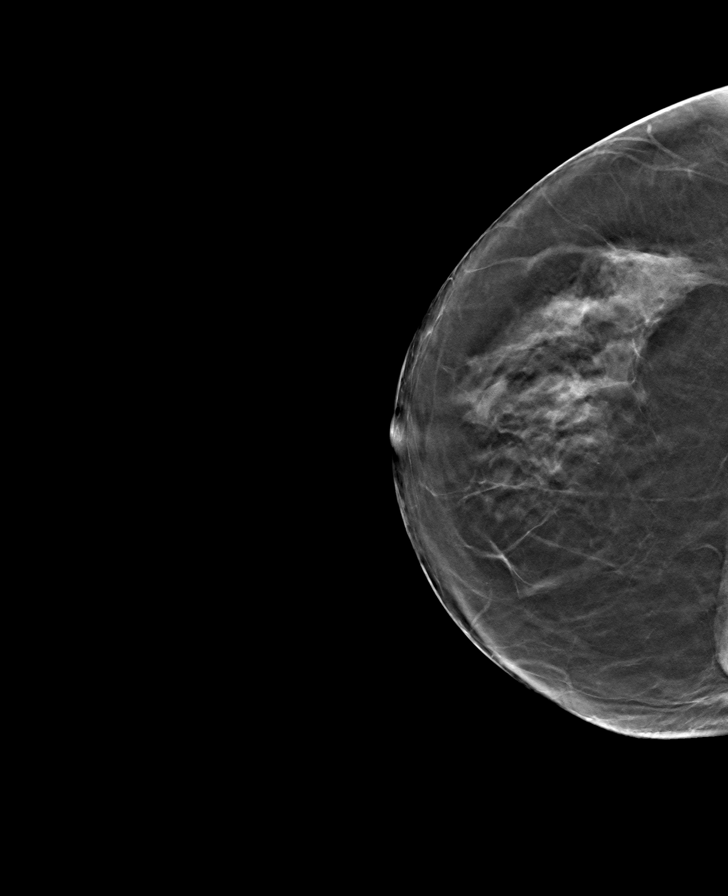

[L MLO tomo · tomo slice 33/65.0]
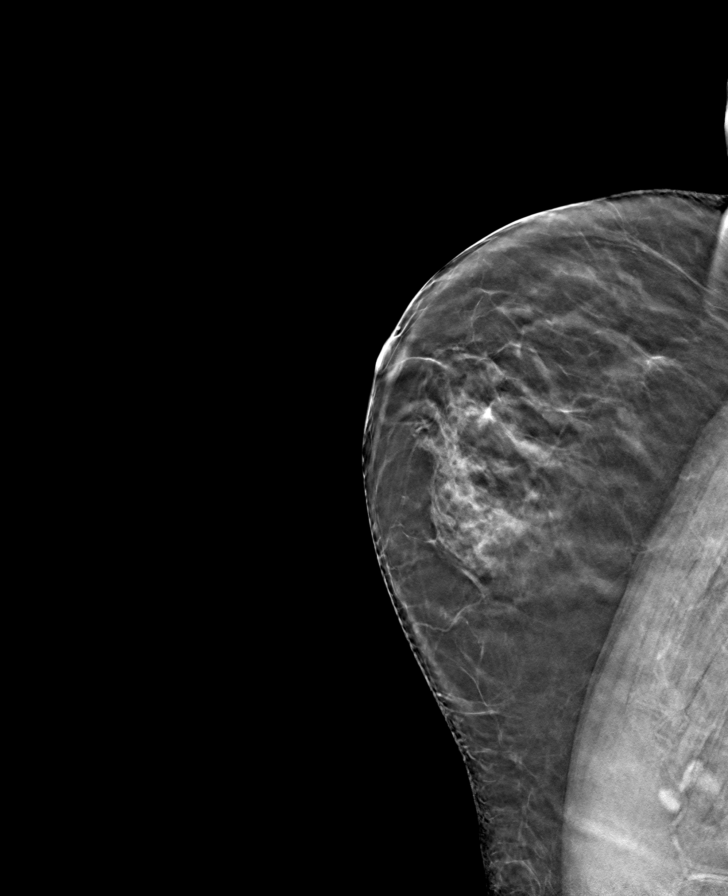

[8 of 24 positions shown; findings below may reference images not displayed]

ACR Breast Density Category c: The breast tissue is heterogeneously
dense, which may obscure small masses.
FINDINGS: There are no findings suspicious for malignancy. Images were
processed with CAD.
IMPRESSION: No mammographic evidence of malignancy. A result letter of this
screening mammogram will be mailed directly to the patient.

RECOMMENDATION:
Screening mammogram in one year. (Code:FT-[id number])

BI-RADS CATEGORY  1: Negative.

## 2020-07-29 DIAGNOSIS — H40153 Residual stage of open-angle glaucoma, bilateral: Secondary | ICD-10-CM | POA: Diagnosis not present

## 2020-07-29 DIAGNOSIS — H524 Presbyopia: Secondary | ICD-10-CM | POA: Diagnosis not present

## 2020-08-24 ENCOUNTER — Other Ambulatory Visit: Payer: Self-pay | Admitting: Internal Medicine

## 2020-08-24 DIAGNOSIS — Z1231 Encounter for screening mammogram for malignant neoplasm of breast: Secondary | ICD-10-CM

## 2020-09-01 ENCOUNTER — Ambulatory Visit
Admission: RE | Admit: 2020-09-01 | Discharge: 2020-09-01 | Disposition: A | Discharge status: Home / Self Care | Payer: Medicare HMO | Source: Ambulatory Visit | Attending: Internal Medicine | Admitting: Internal Medicine

## 2020-09-01 ENCOUNTER — Other Ambulatory Visit: Payer: Self-pay

## 2020-09-01 DIAGNOSIS — Z1231 Encounter for screening mammogram for malignant neoplasm of breast: Secondary | ICD-10-CM | POA: Insufficient documentation

## 2020-09-06 ENCOUNTER — Other Ambulatory Visit: Payer: Self-pay

## 2020-09-06 ENCOUNTER — Other Ambulatory Visit (INDEPENDENT_AMBULATORY_CARE_PROVIDER_SITE_OTHER): Payer: Medicare HMO

## 2020-09-06 DIAGNOSIS — R739 Hyperglycemia, unspecified: Secondary | ICD-10-CM

## 2020-09-06 DIAGNOSIS — E7800 Pure hypercholesterolemia, unspecified: Secondary | ICD-10-CM | POA: Diagnosis not present

## 2020-09-06 LAB — CBC WITH DIFFERENTIAL/PLATELET
Basophils Absolute: 0 10*3/uL (ref 0.0–0.1)
Basophils Relative: 0.7 % (ref 0.0–3.0)
Eosinophils Absolute: 0.2 10*3/uL (ref 0.0–0.7)
Eosinophils Relative: 4.8 % (ref 0.0–5.0)
HCT: 38.5 % (ref 36.0–46.0)
Hemoglobin: 13.2 g/dL (ref 12.0–15.0)
Lymphocytes Relative: 32.7 % (ref 12.0–46.0)
Lymphs Abs: 1.6 10*3/uL (ref 0.7–4.0)
MCHC: 34.2 g/dL (ref 30.0–36.0)
MCV: 87.9 fl (ref 78.0–100.0)
Monocytes Absolute: 0.3 10*3/uL (ref 0.1–1.0)
Monocytes Relative: 5.9 % (ref 3.0–12.0)
Neutro Abs: 2.7 10*3/uL (ref 1.4–7.7)
Neutrophils Relative %: 55.9 % (ref 43.0–77.0)
Platelets: 156 10*3/uL (ref 150.0–400.0)
RBC: 4.38 Mil/uL (ref 3.87–5.11)
RDW: 13.6 % (ref 11.5–15.5)
WBC: 4.9 10*3/uL (ref 4.0–10.5)

## 2020-09-06 LAB — LIPID PANEL
Cholesterol: 150 mg/dL (ref 0–200)
HDL: 43.7 mg/dL (ref >39.00)
LDL Cholesterol: 75 mg/dL (ref 0–99)
NonHDL: 105.9
Total CHOL/HDL Ratio: 3
Triglycerides: 153 mg/dL — ABNORMAL HIGH (ref 0.0–149.0)
VLDL: 30.6 mg/dL (ref 0.0–40.0)

## 2020-09-06 LAB — HEPATIC FUNCTION PANEL
ALT: 16 U/L (ref 0–35)
AST: 21 U/L (ref 0–37)
Albumin: 4.5 g/dL (ref 3.5–5.2)
Alkaline Phosphatase: 54 U/L (ref 39–117)
Bilirubin, Direct: 0.2 mg/dL (ref 0.0–0.3)
Total Bilirubin: 0.9 mg/dL (ref 0.2–1.2)
Total Protein: 7.2 g/dL (ref 6.0–8.3)

## 2020-09-06 LAB — BASIC METABOLIC PANEL
BUN: 14 mg/dL (ref 6–23)
CO2: 28 mEq/L (ref 19–32)
Calcium: 9.8 mg/dL (ref 8.4–10.5)
Chloride: 103 mEq/L (ref 96–112)
Creatinine, Ser: 0.85 mg/dL (ref 0.40–1.20)
GFR: 67.72 mL/min (ref >60.00)
Glucose, Bld: 97 mg/dL (ref 70–99)
Potassium: 4.2 mEq/L (ref 3.5–5.1)
Sodium: 139 mEq/L (ref 135–145)

## 2020-09-06 LAB — TSH: TSH: 2.61 u[IU]/mL (ref 0.35–5.50)

## 2020-09-06 LAB — HEMOGLOBIN A1C: Hgb A1c MFr Bld: 5.7 % (ref 4.6–6.5)

## 2020-09-27 ENCOUNTER — Other Ambulatory Visit: Payer: Self-pay | Admitting: Internal Medicine

## 2020-10-19 DIAGNOSIS — H532 Diplopia: Secondary | ICD-10-CM | POA: Diagnosis not present

## 2020-10-19 DIAGNOSIS — G4733 Obstructive sleep apnea (adult) (pediatric): Secondary | ICD-10-CM | POA: Diagnosis not present

## 2020-10-19 DIAGNOSIS — Z9989 Dependence on other enabling machines and devices: Secondary | ICD-10-CM | POA: Diagnosis not present

## 2020-10-25 DIAGNOSIS — H40153 Residual stage of open-angle glaucoma, bilateral: Secondary | ICD-10-CM | POA: Diagnosis not present

## 2020-10-26 ENCOUNTER — Other Ambulatory Visit (HOSPITAL_COMMUNITY): Payer: Self-pay | Admitting: Neurology

## 2020-10-26 ENCOUNTER — Other Ambulatory Visit: Payer: Self-pay | Admitting: Neurology

## 2020-10-26 DIAGNOSIS — H532 Diplopia: Secondary | ICD-10-CM

## 2020-10-27 DIAGNOSIS — Z809 Family history of malignant neoplasm, unspecified: Secondary | ICD-10-CM | POA: Diagnosis not present

## 2020-10-27 DIAGNOSIS — H40159 Residual stage of open-angle glaucoma, unspecified eye: Secondary | ICD-10-CM | POA: Diagnosis not present

## 2020-10-27 DIAGNOSIS — Z8673 Personal history of transient ischemic attack (TIA), and cerebral infarction without residual deficits: Secondary | ICD-10-CM | POA: Diagnosis not present

## 2020-10-27 DIAGNOSIS — G4733 Obstructive sleep apnea (adult) (pediatric): Secondary | ICD-10-CM | POA: Diagnosis not present

## 2020-10-27 DIAGNOSIS — Z85828 Personal history of other malignant neoplasm of skin: Secondary | ICD-10-CM | POA: Diagnosis not present

## 2020-10-27 DIAGNOSIS — Z7722 Contact with and (suspected) exposure to environmental tobacco smoke (acute) (chronic): Secondary | ICD-10-CM | POA: Diagnosis not present

## 2020-10-27 DIAGNOSIS — Z825 Family history of asthma and other chronic lower respiratory diseases: Secondary | ICD-10-CM | POA: Diagnosis not present

## 2020-10-27 DIAGNOSIS — I951 Orthostatic hypotension: Secondary | ICD-10-CM | POA: Diagnosis not present

## 2020-10-27 DIAGNOSIS — Z8249 Family history of ischemic heart disease and other diseases of the circulatory system: Secondary | ICD-10-CM | POA: Diagnosis not present

## 2020-10-27 DIAGNOSIS — E785 Hyperlipidemia, unspecified: Secondary | ICD-10-CM | POA: Diagnosis not present

## 2020-10-28 ENCOUNTER — Other Ambulatory Visit: Payer: Self-pay

## 2020-10-28 ENCOUNTER — Ambulatory Visit (INDEPENDENT_AMBULATORY_CARE_PROVIDER_SITE_OTHER): Payer: Medicare HMO

## 2020-10-28 DIAGNOSIS — Z23 Encounter for immunization: Secondary | ICD-10-CM

## 2020-11-04 ENCOUNTER — Other Ambulatory Visit: Payer: Medicare HMO

## 2020-11-05 ENCOUNTER — Ambulatory Visit (HOSPITAL_COMMUNITY)
Admission: RE | Admit: 2020-11-05 | Discharge: 2020-11-05 | Disposition: A | Discharge status: Home / Self Care | Payer: Medicare HMO | Source: Ambulatory Visit | Attending: Neurology | Admitting: Neurology

## 2020-11-05 ENCOUNTER — Other Ambulatory Visit: Payer: Self-pay

## 2020-11-05 DIAGNOSIS — I720 Aneurysm of carotid artery: Secondary | ICD-10-CM | POA: Diagnosis not present

## 2020-11-05 DIAGNOSIS — H532 Diplopia: Secondary | ICD-10-CM | POA: Diagnosis not present

## 2020-11-10 ENCOUNTER — Other Ambulatory Visit: Payer: Self-pay

## 2020-11-10 ENCOUNTER — Encounter: Payer: Self-pay | Admitting: Internal Medicine

## 2020-11-10 ENCOUNTER — Ambulatory Visit (INDEPENDENT_AMBULATORY_CARE_PROVIDER_SITE_OTHER): Payer: Medicare HMO | Admitting: Internal Medicine

## 2020-11-10 VITALS — BP 128/74 | HR 91 | Temp 97.9°F | Resp 16 | Ht 66.0 in | Wt 136.4 lb

## 2020-11-10 DIAGNOSIS — E7800 Pure hypercholesterolemia, unspecified: Secondary | ICD-10-CM

## 2020-11-10 DIAGNOSIS — H532 Diplopia: Secondary | ICD-10-CM

## 2020-11-10 DIAGNOSIS — R739 Hyperglycemia, unspecified: Secondary | ICD-10-CM | POA: Diagnosis not present

## 2020-11-10 DIAGNOSIS — G4733 Obstructive sleep apnea (adult) (pediatric): Secondary | ICD-10-CM

## 2020-11-10 DIAGNOSIS — I720 Aneurysm of carotid artery: Secondary | ICD-10-CM | POA: Diagnosis not present

## 2020-11-10 DIAGNOSIS — R748 Abnormal levels of other serum enzymes: Secondary | ICD-10-CM

## 2020-11-10 NOTE — Progress Notes (Signed)
Patient ID: Dawn Wolfe, female   DOB: [birth year], 75 y.o.   MRN: [id number]   Subjective:    Patient ID: Dawn Wolfe, female    DOB: 05-14-1945, 75 y.o.   MRN: [id number]  This visit occurred during the SARS-CoV-2 public health emergency.  Safety protocols were in place, including screening questions prior to the visit, additional usage of staff PPE, and extensive cleaning of exam room while observing appropriate contact time as indicated for disinfecting solutions.   Patient here for a scheduled follow up.   Chief Complaint  Patient presents with   Hyperlipidemia   Hyperglycemia   .   HPI Increased stress recently - related to MRA results.  Was evaluated by neurology for diplopia.  MRA -  1. 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  2. A much more shallow outpouching in a similar location on the left  without a discrete aneurysm.  3. Otherwise normal MRA circle-of-Willis.   Was notified of results by neurology and reports increased stress since.  No headache.  Vision stable.  No chest pain or sob.  Stays active.  No increased acid reflux.  No abdominal pain.  Bowels moving.  Watching her diet.  Exercises.  Blood pressure doing well.   Past Medical History:  Diagnosis Date   Anemia    h/o as a child   Arthritis    left shoulder   Cancer ([place]) 15 yrs ago   [employer] on the face. Surgical removal   Complication of anesthesia    Hemorrhoids    History of colonoscopy 2003   Normal, Dr. Sammuel Cooper, [place]    Hypercholesterolemia    Normal exercise sestamibi stress test 2008   Ken Fath   Obstructive sleep apnea on CPAP 2007   PONV (postoperative nausea and vomiting)    Rotator cuff tear, left    Sleep apnea    Past Surgical History:  Procedure Laterality Date   ABDOMINALPLASTY W/ LIPOSUCTION  AGE 54   BUNIONECTOMY  X3  LAST ONE 2003   CHOLECYSTECTOMY N/A 04/19/2015   Procedure: LAPAROSCOPIC CHOLECYSTECTOMY WITH INTRAOPERATIVE  CHOLANGIOGRAM;  Surgeon: Robert Bellow, MD;  Location: [facility] ORS;  Service: General;  Laterality: N/A;   COLONOSCOPY  2012   Dr Wyline Mood in [place] N/A 09/11/2016   Procedure: COLONOSCOPY WITH PROPOFOL;  Surgeon: Lollie Sails, MD;  Location[facility] ENDOSCOPY;  Service: Endoscopy;  Laterality: N/A;   ERCP N/A 04/29/2015   Procedure: ENDOSCOPIC RETROGRADE CHOLANGIOPANCREATOGRAPHY (ERCP);  Surgeon: Hulen Luster, MD;  Location: [facility] ENDOSCOPY;  Service: Gastroenterology;  Laterality: N/A;   EYE SURGERY     cataract extraction   NASAL SEPTUM SURGERY  AGE 89   NASAL SINUS SURGERY  AGE 82   SHOULDER ARTHROSCOPY  10/25/2011   Procedure: ARTHROSCOPY SHOULDER;  Surgeon: Magnus Sinning, MD;  Location: [place];  Service: Orthopedics;  Laterality: Left;  with SAD, shave of the labrium    TUBAL LIGATION  1982   Family History  Problem Relation Age of Onset   Acute myelogenous leukemia Sister    Polycythemia Sister 83   Emphysema Father        smoker   Heart disease Father        myocardial infarction - 6   COPD Father    Heart attack Father    Breast cancer Paternal Aunt    Testicular cancer Brother  Heart disease Mother    Emphysema Mother    COPD Mother    Asthma Mother    Heart attack Mother    Leukemia Sister    Colon cancer Neg Hx    Social History   Socioeconomic History   Marital status: Married    Spouse name: Not on file   Number of children: Not on file   Years of education: Not on file   Highest education level: Not on file  Occupational History   Not on file  Tobacco Use   Smoking status: Former    Packs/day: 2.50    Years: 10.00    Pack years: 25.00    Types: Cigarettes    Quit date: 03/28/1977    Years since quitting: 43.6   Smokeless tobacco: Never  Substance and Sexual Activity   Alcohol use: No    Alcohol/week: 0.0 standard drinks   Drug use: No   Sexual activity: Not Currently    Birth  control/protection: Post-menopausal  Other Topics Concern   Not on file  Social History Narrative   Not on file   Social Determinants of Health   Financial Resource Strain: Low Risk    Difficulty of Paying Living Expenses: Not hard at all  Food Insecurity: No Food Insecurity   Worried About Charity fundraiser in the Last Year: Never true   Ran Out of Food in the Last Year: Never true  Transportation Needs: No Transportation Needs   Lack of Transportation (Medical): No   Lack of Transportation (Non-Medical): No  Physical Activity: Sufficiently Active   Days of Exercise per Week: 5 days   Minutes of Exercise per Session: 30 min  Stress: No Stress Concern Present   Feeling of Stress : Not at all  Social Connections: Unknown   Frequency of Communication with Friends and Family: Not on file   Frequency of Social Gatherings with Friends and Family: Not on file   Attends Religious Services: Not on file   Active Member of Clubs or Organizations: Not on file   Attends Archivist Meetings: Not on file   Marital Status: Married     Review of Systems  Constitutional:  Negative for appetite change and unexpected weight change.  HENT:  Negative for congestion and sinus pressure.   Respiratory:  Negative for cough, chest tightness and shortness of breath.   Cardiovascular:  Negative for chest pain, palpitations and leg swelling.  Gastrointestinal:  Negative for abdominal pain, diarrhea, nausea and vomiting.  Genitourinary:  Negative for difficulty urinating and dysuria.  Musculoskeletal:  Negative for joint swelling and myalgias.  Skin:  Negative for color change and rash.  Neurological:  Negative for dizziness, light-headedness and headaches.  Psychiatric/Behavioral:  Negative for agitation and dysphoric mood.       Objective:     BP 128/74   Pulse 91   Temp 97.9 F (36.6 C)   Resp 16   Ht _0  (1.676 m)   Wt 136 lb 6.4 oz (61.9 kg)   SpO2 98%   BMI 22.02 kg/m   Wt Readings from Last 3 Encounters:  11/10/20 136 lb 6.4 oz (61.9 kg)  07/09/20 136 lb 12.8 oz (62.1 kg)  05/05/20 137 lb 12.8 oz (62.5 kg)    Physical Exam Vitals reviewed.  Constitutional:      General: She is not in acute distress.    Appearance: Normal appearance.  HENT:     Head: Normocephalic and atraumatic.  Right Ear: External ear normal.     Left Ear: External ear normal.  Eyes:     General: No scleral icterus.       Right eye: No discharge.        Left eye: No discharge.     Conjunctiva/sclera: Conjunctivae normal.  Neck:     Thyroid: No thyromegaly.  Cardiovascular:     Rate and Rhythm: Normal rate and regular rhythm.  Pulmonary:     Effort: No respiratory distress.     Breath sounds: Normal breath sounds. No wheezing.  Abdominal:     General: Bowel sounds are normal.     Palpations: Abdomen is soft.     Tenderness: There is no abdominal tenderness.  Musculoskeletal:        General: No swelling or tenderness.     Cervical back: Neck supple. No tenderness.  Lymphadenopathy:     Cervical: No cervical adenopathy.  Skin:    Findings: No erythema or rash.  Neurological:     Mental Status: She is alert.  Psychiatric:        Mood and Affect: Mood normal.        Behavior: Behavior normal.     Outpatient Encounter Medications as of 11/10/2020  Medication Sig   aspirin EC 81 MG tablet Take 81 mg by mouth daily.   latanoprost (XALATAN) 0.005 % ophthalmic solution Place 1 drop into both eyes.   Multiple Vitamin (MULTIVITAMIN) capsule Take 1 capsule by mouth daily.   pravastatin (PRAVACHOL) 10 MG tablet TAKE 1 TABLET BY MOUTH EVERY DAY   [DISCONTINUED] Magnesium 500 MG TABS Take by mouth daily at 12 noon.   No facility-administered encounter medications on file as of 11/10/2020.     Lab Results  Component Value Date   WBC 4.9 09/06/2020   HGB 13.2 09/06/2020   HCT 38.5 09/06/2020   PLT 156.0 09/06/2020   GLUCOSE 97 09/06/2020   CHOL 150 09/06/2020    TRIG 153.0 (H) 09/06/2020   HDL 43.70 09/06/2020   LDLDIRECT 88.0 07/22/2014   LDLCALC 75 09/06/2020   ALT 16 09/06/2020   AST 21 09/06/2020   NA 139 09/06/2020   K 4.2 09/06/2020   CL 103 09/06/2020   CREATININE 0.85 09/06/2020   BUN 14 09/06/2020   CO2 28 09/06/2020   TSH 2.61 09/06/2020   HGBA1C 5.7 09/06/2020    MR ANGIO HEAD WO CONTRAST  Result Date: 11/06/2020 CLINICAL DATA:  Diplopia. EXAM: MRA HEAD WITHOUT CONTRAST TECHNIQUE: Angiographic images of the Circle of Willis were acquired using MRA technique without intravenous contrast. COMPARISON:  No pertinent prior exam. 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 No additional aneurysms are present. A much more shallow outpouching is present in a similar location on the left. The internal carotid arteries are otherwise within normal limits through the ICA termini. The A1 and M1 segments are normal. MCA bifurcations are intact. The A1 and M1 segments are normal. Posterior circulation: The vertebral arteries are codominant. Left PICA origin is visualized and normal. The right AICA is dominant. Vertebrobasilar junction is normal. The basilar artery is within normal limits. Both posterior cerebral arteries originate from the basilar tip. Anatomic variants: Dominant right AI Ca. Other: None. IMPRESSION: 1. 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 2. A much more shallow outpouching in a similar location on the left without a  discrete aneurysm. 3. Otherwise normal MRA circle-of-Willis. Electronically Signed   By: San Morelle M.D.   On: 11/06/2020 13:58       Assessment & Plan:   Problem List Items Addressed This Visit     Abnormal liver enzymes    Abdominal ultrasound - changes c/w fatty liver.  She has adjusted her diet.  Lost weight.  Follow liver function tests.        Carotid artery  aneurysm (HCC)    MRA as outlined.  Saw neurology.  Keep blood pressure under good control.  F/u with neurology regarding any further recs. Had questions regarding aneurysm.        Double vision    W/up as outlined.  Saw neurology.  MRA as outlined.  No change in vision currently.  Follow.        Hypercholesteremia    On pravastatin.  Low cholesterol diet and exercise.  Follow lipid panel and liver function tests.   Lab Results  Component Value Date   CHOL 150 09/06/2020   HDL 43.70 09/06/2020   LDLCALC 75 09/06/2020   LDLDIRECT 88.0 07/22/2014   TRIG 153.0 (H) 09/06/2020   CHOLHDL 3 09/06/2020       Relevant Orders   Basic metabolic panel   Hepatic function panel   Lipid panel   Hyperglycemia - Primary    Continues to watch diet.  Follow met b and a1c.   Lab Results  Component Value Date   HGBA1C 5.7 09/06/2020       Relevant Orders   Hemoglobin A1c   Obstructive sleep apnea    CPAP.         Einar Pheasant, MD

## 2020-11-21 ENCOUNTER — Encounter: Payer: Self-pay | Admitting: Internal Medicine

## 2020-11-21 DIAGNOSIS — I720 Aneurysm of carotid artery: Secondary | ICD-10-CM | POA: Insufficient documentation

## 2020-11-21 NOTE — Assessment & Plan Note (Signed)
On pravastatin.  Low cholesterol diet and exercise.  Follow lipid panel and liver function tests.   Lab Results  Component Value Date   CHOL 150 09/06/2020   HDL 43.70 09/06/2020   LDLCALC 75 09/06/2020   LDLDIRECT 88.0 07/22/2014   TRIG 153.0 (H) 09/06/2020   CHOLHDL 3 09/06/2020

## 2020-11-21 NOTE — Assessment & Plan Note (Addendum)
MRA as outlined.  Saw neurology.  Keep blood pressure under good control.  F/u with neurology regarding any further recs. Had questions regarding aneurysm.

## 2020-11-21 NOTE — Assessment & Plan Note (Signed)
Abdominal ultrasound - changes c/w fatty liver.  She has adjusted her diet.  Lost weight.  Follow liver function tests.

## 2020-11-21 NOTE — Assessment & Plan Note (Signed)
Continues to watch diet.  Follow met b and a1c.   Lab Results  Component Value Date   HGBA1C 5.7 09/06/2020

## 2020-11-21 NOTE — Assessment & Plan Note (Signed)
CPAP.  

## 2020-11-21 NOTE — Assessment & Plan Note (Signed)
W/up as outlined.  Saw neurology.  MRA as outlined.  No change in vision currently.  Follow.

## 2020-11-23 DIAGNOSIS — D2261 Melanocytic nevi of right upper limb, including shoulder: Secondary | ICD-10-CM | POA: Diagnosis not present

## 2020-11-23 DIAGNOSIS — D2262 Melanocytic nevi of left upper limb, including shoulder: Secondary | ICD-10-CM | POA: Diagnosis not present

## 2020-11-23 DIAGNOSIS — Z85828 Personal history of other malignant neoplasm of skin: Secondary | ICD-10-CM | POA: Diagnosis not present

## 2020-11-23 DIAGNOSIS — D2271 Melanocytic nevi of right lower limb, including hip: Secondary | ICD-10-CM | POA: Diagnosis not present

## 2021-01-11 ENCOUNTER — Other Ambulatory Visit (INDEPENDENT_AMBULATORY_CARE_PROVIDER_SITE_OTHER): Payer: Medicare HMO

## 2021-01-11 ENCOUNTER — Other Ambulatory Visit: Payer: Self-pay

## 2021-01-11 DIAGNOSIS — E7800 Pure hypercholesterolemia, unspecified: Secondary | ICD-10-CM | POA: Diagnosis not present

## 2021-01-11 DIAGNOSIS — R739 Hyperglycemia, unspecified: Secondary | ICD-10-CM | POA: Diagnosis not present

## 2021-01-11 LAB — LIPID PANEL
Cholesterol: 161 mg/dL (ref 0–200)
HDL: 42.8 mg/dL (ref >39.00)
LDL Cholesterol: 88 mg/dL (ref 0–99)
NonHDL: 118.64
Total CHOL/HDL Ratio: 4
Triglycerides: 152 mg/dL — ABNORMAL HIGH (ref 0.0–149.0)
VLDL: 30.4 mg/dL (ref 0.0–40.0)

## 2021-01-11 LAB — BASIC METABOLIC PANEL
BUN: 16 mg/dL (ref 6–23)
CO2: 29 mEq/L (ref 19–32)
Calcium: 9.6 mg/dL (ref 8.4–10.5)
Chloride: 104 mEq/L (ref 96–112)
Creatinine, Ser: 0.9 mg/dL (ref 0.40–1.20)
GFR: 63.07 mL/min (ref >60.00)
Glucose, Bld: 112 mg/dL — ABNORMAL HIGH (ref 70–99)
Potassium: 4.5 mEq/L (ref 3.5–5.1)
Sodium: 139 mEq/L (ref 135–145)

## 2021-01-11 LAB — HEMOGLOBIN A1C: Hgb A1c MFr Bld: 5.7 % (ref 4.6–6.5)

## 2021-01-11 LAB — HEPATIC FUNCTION PANEL
ALT: 14 U/L (ref 0–35)
AST: 23 U/L (ref 0–37)
Albumin: 4.5 g/dL (ref 3.5–5.2)
Alkaline Phosphatase: 53 U/L (ref 39–117)
Bilirubin, Direct: 0.1 mg/dL (ref 0.0–0.3)
Total Bilirubin: 0.9 mg/dL (ref 0.2–1.2)
Total Protein: 7.4 g/dL (ref 6.0–8.3)

## 2021-02-17 DIAGNOSIS — H40153 Residual stage of open-angle glaucoma, bilateral: Secondary | ICD-10-CM | POA: Diagnosis not present

## 2021-02-22 ENCOUNTER — Other Ambulatory Visit: Payer: Self-pay | Admitting: Internal Medicine

## 2021-03-14 ENCOUNTER — Ambulatory Visit: Payer: Medicare HMO | Admitting: Internal Medicine

## 2021-03-22 ENCOUNTER — Ambulatory Visit (INDEPENDENT_AMBULATORY_CARE_PROVIDER_SITE_OTHER): Payer: Medicare HMO | Admitting: Internal Medicine

## 2021-03-22 ENCOUNTER — Other Ambulatory Visit: Payer: Self-pay

## 2021-03-22 DIAGNOSIS — H532 Diplopia: Secondary | ICD-10-CM

## 2021-03-22 DIAGNOSIS — I720 Aneurysm of carotid artery: Secondary | ICD-10-CM

## 2021-03-22 DIAGNOSIS — Z86010 Personal history of colonic polyps: Secondary | ICD-10-CM | POA: Diagnosis not present

## 2021-03-22 DIAGNOSIS — R739 Hyperglycemia, unspecified: Secondary | ICD-10-CM

## 2021-03-22 DIAGNOSIS — R748 Abnormal levels of other serum enzymes: Secondary | ICD-10-CM

## 2021-03-22 DIAGNOSIS — R000 Tachycardia, unspecified: Secondary | ICD-10-CM | POA: Diagnosis not present

## 2021-03-22 DIAGNOSIS — E7800 Pure hypercholesterolemia, unspecified: Secondary | ICD-10-CM

## 2021-03-22 DIAGNOSIS — G4733 Obstructive sleep apnea (adult) (pediatric): Secondary | ICD-10-CM

## 2021-03-22 LAB — CBC WITH DIFFERENTIAL/PLATELET
Basophils Absolute: 0 10*3/uL (ref 0.0–0.1)
Basophils Relative: 1 % (ref 0.0–3.0)
Eosinophils Absolute: 0.2 10*3/uL (ref 0.0–0.7)
Eosinophils Relative: 4.4 % (ref 0.0–5.0)
HCT: 38.5 % (ref 36.0–46.0)
Hemoglobin: 13.1 g/dL (ref 12.0–15.0)
Lymphocytes Relative: 32.6 % (ref 12.0–46.0)
Lymphs Abs: 1.6 10*3/uL (ref 0.7–4.0)
MCHC: 34.1 g/dL (ref 30.0–36.0)
MCV: 87.7 fl (ref 78.0–100.0)
Monocytes Absolute: 0.4 10*3/uL (ref 0.1–1.0)
Monocytes Relative: 7.2 % (ref 3.0–12.0)
Neutro Abs: 2.7 10*3/uL (ref 1.4–7.7)
Neutrophils Relative %: 54.8 % (ref 43.0–77.0)
Platelets: 190 10*3/uL (ref 150.0–400.0)
RBC: 4.39 Mil/uL (ref 3.87–5.11)
RDW: 13.7 % (ref 11.5–15.5)
WBC: 4.9 10*3/uL (ref 4.0–10.5)

## 2021-03-22 LAB — BASIC METABOLIC PANEL
BUN: 15 mg/dL (ref 6–23)
CO2: 29 mEq/L (ref 19–32)
Calcium: 10.1 mg/dL (ref 8.4–10.5)
Chloride: 102 mEq/L (ref 96–112)
Creatinine, Ser: 0.92 mg/dL (ref 0.40–1.20)
GFR: 61.35 mL/min (ref >60.00)
Glucose, Bld: 109 mg/dL — ABNORMAL HIGH (ref 70–99)
Potassium: 4.4 mEq/L (ref 3.5–5.1)
Sodium: 138 mEq/L (ref 135–145)

## 2021-03-22 LAB — TSH: TSH: 2.46 u[IU]/mL (ref 0.35–5.50)

## 2021-03-22 NOTE — Progress Notes (Signed)
Patient ID: Dawn Wolfe, female   DOB: 13-Jan-1946, 76 y.o.   MRN: [id number]   Subjective:    Patient ID: Dawn Wolfe, female    DOB: 1945-12-19, 76 y.o.   MRN: [id number]  This visit occurred during the SARS-CoV-2 public health emergency.  Safety protocols were in place, including screening questions prior to the visit, additional usage of staff PPE, and extensive cleaning of exam room while observing appropriate contact time as indicated for disinfecting solutions.   Patient here for a scheduled follow up.   Chief Complaint  Patient presents with   Tachycardia   Hyperlipidemia   .   HPI Here to follow up regarding her cholesterol, blood pressure and OSA.  She reports she has been doing well.  She did report yesterday - noticed heart started racing.  Checked - blood pressure monitor - systolic pressure 99 with pulse rate in 150s.  No chest pain or sob.  Sates she has not been active for the last several months.  With the increased heart, she went out and pushed mowed the back yard.  Picked up weeds.  Walked to her husband's shop and the heart racing resolved.  States lasted several hours.  No dizziness, light headedness or near syncope.  She was sick starting several weeks ago - lasted a few weeks, no cough or congestion currently.  No sinus medication.  Normally drinks three cups of coffee in am.  Prior to getting sick, she noticed a brief episode (1-2 seconds) - described as cloudy vision.  Sates she shook her head and resolved.  No further episodes.  No increased heart rate or palpitations since.  On questioning, she has experienced previously some episodes where she would feel heart racing, would swallow and it would resolve.  Eating.  No nausea, vomiting or diarrhea.     Past Medical History:  Diagnosis Date   Anemia    h/o as a child   Arthritis    left shoulder   Cancer ([place]) 15 yrs ago   [employer] on the face. Surgical removal   Complication of anesthesia     Hemorrhoids    History of colonoscopy 2003   Normal, Dr. Sammuel Cooper, [place]    Hypercholesterolemia    Normal exercise sestamibi stress test 2008   Ken Fath   Obstructive sleep apnea on CPAP 2007   PONV (postoperative nausea and vomiting)    Rotator cuff tear, left    Sleep apnea    Past Surgical History:  Procedure Laterality Date   ABDOMINALPLASTY W/ LIPOSUCTION  AGE 32   BUNIONECTOMY  X3  LAST ONE 2003   CHOLECYSTECTOMY N/A 04/19/2015   Procedure: LAPAROSCOPIC CHOLECYSTECTOMY WITH INTRAOPERATIVE CHOLANGIOGRAM;  Surgeon: Robert Bellow, MD;  Location: [facility] ORS;  Service: General;  Laterality: N/A;   COLONOSCOPY  2012   Dr Wyline Mood in [place] N/A 09/11/2016   Procedure: COLONOSCOPY WITH PROPOFOL;  Surgeon: Lollie Sails, MD;  Location: [facility] ENDOSCOPY;  Service: Endoscopy;  Laterality: N/A;   ERCP N/A 04/29/2015   Procedure: ENDOSCOPIC RETROGRADE CHOLANGIOPANCREATOGRAPHY (ERCP);  Surgeon: Hulen Luster, MD;  Location: [facility] ENDOSCOPY;  Service: Gastroenterology;  Laterality: N/A;   EYE SURGERY     cataract extraction   NASAL SEPTUM SURGERY  AGE 89   NASAL SINUS SURGERY  AGE 25   SHOULDER ARTHROSCOPY  10/25/2011   Procedure: ARTHROSCOPY SHOULDER;  Surgeon: Magnus Sinning, MD;  Location: [facility];  Service:  Orthopedics;  Laterality: Left;  with SAD, shave of the labrium    TUBAL LIGATION  1982   Family History  Problem Relation Age of Onset   Acute myelogenous leukemia Sister    Polycythemia Sister 21   Emphysema Father        smoker   Heart disease Father        myocardial infarction - 3   COPD Father    Heart attack Father    Breast cancer Paternal Aunt    Testicular cancer Brother    Heart disease Mother    Emphysema Mother    COPD Mother    Asthma Mother    Heart attack Mother    Leukemia Sister    Colon cancer Neg Hx    Social History   Socioeconomic History   Marital status: Married    Spouse name: Not on  file   Number of children: Not on file   Years of education: Not on file   Highest education level: Not on file  Occupational History   Not on file  Tobacco Use   Smoking status: Former    Packs/day: 2.50    Years: 10.00    Pack years: 25.00    Types: Cigarettes    Quit date: 03/28/1977    Years since quitting: 44.0   Smokeless tobacco: Never  Substance and Sexual Activity   Alcohol use: No    Alcohol/week: 0.0 standard drinks   Drug use: No   Sexual activity: Not Currently    Birth control/protection: Post-menopausal  Other Topics Concern   Not on file  Social History Narrative   Not on file   Social Determinants of Health   Financial Resource Strain: Low Risk    Difficulty of Paying Living Expenses: Not hard at all  Food Insecurity: No Food Insecurity   Worried About Charity fundraiser in the Last Year: Never true   Ran Out of Food in the Last Year: Never true  Transportation Needs: No Transportation Needs   Lack of Transportation (Medical): No   Lack of Transportation (Non-Medical): No  Physical Activity: Sufficiently Active   Days of Exercise per Week: 5 days   Minutes of Exercise per Session: 30 min  Stress: No Stress Concern Present   Feeling of Stress : Not at all  Social Connections: Unknown   Frequency of Communication with Friends and Family: Not on file   Frequency of Social Gatherings with Friends and Family: Not on file   Attends Religious Services: Not on file   Active Member of Clubs or Organizations: Not on file   Attends Archivist Meetings: Not on file   Marital Status: Married     Review of Systems  Constitutional:  Negative for appetite change and unexpected weight change.  HENT:  Negative for congestion and sinus pressure.   Respiratory:  Negative for cough, chest tightness and shortness of breath.   Cardiovascular:  Negative for chest pain and leg swelling.       Increased heart rate as outlined - episode yesterday.    Gastrointestinal:  Negative for abdominal pain, diarrhea, nausea and vomiting.  Genitourinary:  Negative for difficulty urinating and dysuria.  Musculoskeletal:  Negative for joint swelling and myalgias.  Skin:  Negative for color change and rash.  Neurological:  Negative for dizziness, light-headedness and headaches.  Psychiatric/Behavioral:  Negative for agitation and dysphoric mood.       Objective:     BP 118/72  Pulse 93    Temp 97.9 °F (36.6 °C)    Resp 16    Ht 5' 6" (1.676 m)    Wt 133 lb 9.6 oz (60.6 kg)    SpO2 98%    BMI 21.56 kg/m²  °Wt Readings from Last 3 Encounters:  °03/22/21 133 lb 9.6 oz (60.6 kg)  °11/10/20 136 lb 6.4 oz (61.9 kg)  °07/09/20 136 lb 12.8 oz (62.1 kg)  ° ° °Physical Exam °Vitals reviewed.  °Constitutional:   °   General: She is not in acute distress. °   Appearance: Normal appearance.  °HENT:  °   Head: Normocephalic and atraumatic.  °   Right Ear: External ear normal.  °   Left Ear: External ear normal.  °Eyes:  °   General: No scleral icterus.    °   Right eye: No discharge.     °   Left eye: No discharge.  °   Conjunctiva/sclera: Conjunctivae normal.  °Neck:  °   Thyroid: No thyromegaly.  °Cardiovascular:  °   Rate and Rhythm: Normal rate and regular rhythm.  °Pulmonary:  °   Effort: No respiratory distress.  °   Breath sounds: Normal breath sounds. No wheezing.  °Abdominal:  °   General: Bowel sounds are normal.  °   Palpations: Abdomen is soft.  °   Tenderness: There is no abdominal tenderness.  °Musculoskeletal:     °   General: No swelling or tenderness.  °   Cervical back: Neck supple. No tenderness.  °Lymphadenopathy:  °   Cervical: No cervical adenopathy.  °Skin: °   Findings: No erythema or rash.  °Neurological:  °   Mental Status: She is alert.  °Psychiatric:     °   Mood and Affect: Mood normal.     °   Behavior: Behavior normal.  ° ° ° °Outpatient Encounter Medications as of 03/22/2021  °Medication Sig  ° aspirin EC 81 MG tablet Take 81 mg by mouth daily.   ° latanoprost (XALATAN) 0.005 % ophthalmic solution Place 1 drop into both eyes.  ° Multiple Vitamin (MULTIVITAMIN) capsule Take 1 capsule by mouth daily.  ° pravastatin (PRAVACHOL) 10 MG tablet TAKE 1 TABLET BY MOUTH EVERY DAY  ° °No facility-administered encounter medications on file as of 03/22/2021.  °  ° °Lab Results  °Component Value Date  ° WBC 4.9 03/22/2021  ° HGB 13.1 03/22/2021  ° HCT 38.5 03/22/2021  ° PLT 190.0 03/22/2021  ° GLUCOSE 109 (H) 03/22/2021  ° CHOL 161 01/11/2021  ° TRIG 152.0 (H) 01/11/2021  ° HDL 42.80 01/11/2021  ° LDLDIRECT 88.0 07/22/2014  ° LDLCALC 88 01/11/2021  ° ALT 14 01/11/2021  ° AST 23 01/11/2021  ° NA 138 03/22/2021  ° K 4.4 03/22/2021  ° CL 102 03/22/2021  ° CREATININE 0.92 03/22/2021  ° BUN 15 03/22/2021  ° CO2 29 03/22/2021  ° TSH 2.46 03/22/2021  ° HGBA1C 5.7 01/11/2021  ° ° °MR ANGIO HEAD WO CONTRAST ° °Result Date: 11/06/2020 °CLINICAL DATA:  Diplopia. EXAM: MRA HEAD WITHOUT CONTRAST TECHNIQUE: Angiographic images of the Circle of Willis were acquired using MRA technique without intravenous contrast. COMPARISON:  No pertinent prior exam. 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 No additional aneurysms are present. A much more shallow outpouching is present in a similar location on the left. The internal carotid arteries are   otherwise within normal limits through the ICA termini. The A1 and M1 segments are normal. MCA bifurcations are intact. The A1 and M1 segments are normal. Posterior circulation: The vertebral arteries are codominant. Left PICA origin is visualized and normal. The right AICA is dominant. Vertebrobasilar junction is normal. The basilar artery is within normal limits. Both posterior cerebral arteries originate from the basilar tip. Anatomic variants: Dominant right AI Ca. Other: None. IMPRESSION: 1. 2.5 mm wide necked aneurysm in the ophthalmic  segment of the right ICA. 2. A much more shallow outpouching in a similar location on the left without a discrete aneurysm. 3. Otherwise normal MRA circle-of-Willis. Electronically Signed   By: Christopher  Mattern M.D.   On: 11/06/2020 13:58  ° ° °   °Assessment & Plan:  ° °Problem List Items Addressed This Visit   ° ° Abnormal liver enzymes  °  Abdominal ultrasound - changes c/w fatty liver.  She has adjusted her diet.  Lost weight.  Follow liver function tests.   °  °  ° Carotid artery aneurysm (HCC)  °  MRA as outlined.  Saw neurology.  Keep blood pressure under good control.  °  °  ° Double vision  °  W/up as outlined.  Saw neurology.  MRA as outlined.  No change in vision currently.  She did report several weeks ago noticing a brief episode of what she described as "clouds in her vision".  States she shook her head and resolved.  No other vision change.  No pain.  Discussed need for further cardiac w/up.  Also, consider neurology f/u.   °  °  ° Relevant Orders  ° Ambulatory referral to Cardiology  ° History of colon polyps  °  Colonoscopy 08/2016.  °  °  ° Hypercholesteremia  °  On pravastatin.  Low cholesterol diet and exercise.  Follow lipid panel and liver function tests.   °Lab Results  °Component Value Date  ° CHOL 161 01/11/2021  ° HDL 42.80 01/11/2021  ° LDLCALC 88 01/11/2021  ° LDLDIRECT 88.0 07/22/2014  ° TRIG 152.0 (H) 01/11/2021  ° CHOLHDL 4 01/11/2021  °  °  ° Hyperglycemia  °  Continues to watch diet.  Follow met b and a1c.   °Lab Results  °Component Value Date  ° HGBA1C 5.7 01/11/2021  °  °  ° Increased heart rate  °  Had the episode yesterday.  Heart rate 150s.  Episode lasted several hours.  No other symptoms.  Specifically denies any chest pain, sob, syncope or near syncope.  Has had intermittent episodes of rapid heart rate, but usually brief episodes.  Sustained episode yesterday.  Has seen cardiology previously.  Had holter:  documented 4 occurrences of Supraventricular Tachycardia, PVCs.   ECHO 11/2019 - normal LV function with mild MR, AR and TR.  No significant valve abnormality.  EKG today - SR with no acute ischemic changes noted.  Discussed need for f/u with cardiology.  Discussed possible zio monitor,etc.  Check cbc, tsh and metabolic panel.  °  °  ° Relevant Orders  ° EKG 12-Lead (Completed)  ° CBC with Differential/Platelet (Completed)  ° TSH (Completed)  ° Basic metabolic panel (Completed)  ° Ambulatory referral to Cardiology  ° Obstructive sleep apnea  °  Has been using cpap.  Reports has been years since has had sleep study. Requesting reevaluation.  Wants to confirm settings are accurate and confirm if still needed.  Agreeable to pulmonary referral.  °  °  °   Relevant Orders  ° Ambulatory referral to Pulmonology  ° ° ° °Charlene Scott, MD  °

## 2021-03-23 ENCOUNTER — Encounter: Payer: Self-pay | Admitting: Internal Medicine

## 2021-03-23 NOTE — Assessment & Plan Note (Signed)
Colonoscopy 08/2016.  ?

## 2021-03-23 NOTE — Assessment & Plan Note (Signed)
Had the episode yesterday.  Heart rate 150s.  Episode lasted several hours.  No other symptoms.  Specifically denies any chest pain, sob, syncope or near syncope.  Has had intermittent episodes of rapid heart rate, but usually brief episodes.  Sustained episode yesterday.  Has seen cardiology previously.  Had holter:  documented 4 occurrences of Supraventricular Tachycardia, PVCs.  ECHO 11/2019 - normal LV function with mild MR, AR and TR.  No significant valve abnormality.  EKG today - SR with no acute ischemic changes noted.  Discussed need for f/u with cardiology.  Discussed possible zio monitor,etc.  Check cbc, tsh and metabolic panel.  ?

## 2021-03-23 NOTE — Assessment & Plan Note (Signed)
On pravastatin.  Low cholesterol diet and exercise.  Follow lipid panel and liver function tests.   ?Lab Results  ?Component Value Date  ? CHOL 161 01/11/2021  ? HDL 42.80 01/11/2021  ? [place] 88 01/11/2021  ? LDLDIRECT 88.0 07/22/2014  ? TRIG 152.0 (H) 01/11/2021  ? CHOLHDL 4 01/11/2021  ? ?

## 2021-03-23 NOTE — Assessment & Plan Note (Signed)
W/up as outlined.  Saw neurology.  MRA as outlined.  No change in vision currently.  She did report several weeks ago noticing a brief episode of what she described as "clouds in her vision".  States she shook her head and resolved.  No other vision change.  No pain.  Discussed need for further cardiac w/up.  Also, consider neurology f/u.   ?

## 2021-03-23 NOTE — Assessment & Plan Note (Signed)
Abdominal ultrasound - changes c/w fatty liver.  She has adjusted her diet.  Lost weight.  Follow liver function tests.   ?

## 2021-03-23 NOTE — Assessment & Plan Note (Signed)
Continues to watch diet.  Follow met b and a1c.   ?Lab Results  ?Component Value Date  ? HGBA1C 5.7 01/11/2021  ? ?

## 2021-03-23 NOTE — Assessment & Plan Note (Signed)
MRA as outlined.  Saw neurology.  Keep blood pressure under good control.  ?

## 2021-03-23 NOTE — Assessment & Plan Note (Signed)
Has been using cpap.  Reports has been years since has had sleep study. Requesting reevaluation.  Wants to confirm settings are accurate and confirm if still needed.  Agreeable to pulmonary referral.  ?

## 2021-04-19 DIAGNOSIS — G4733 Obstructive sleep apnea (adult) (pediatric): Secondary | ICD-10-CM | POA: Diagnosis not present

## 2021-04-19 DIAGNOSIS — R002 Palpitations: Secondary | ICD-10-CM | POA: Diagnosis not present

## 2021-04-19 DIAGNOSIS — R000 Tachycardia, unspecified: Secondary | ICD-10-CM | POA: Diagnosis not present

## 2021-04-19 DIAGNOSIS — R030 Elevated blood-pressure reading, without diagnosis of hypertension: Secondary | ICD-10-CM | POA: Diagnosis not present

## 2021-05-03 DIAGNOSIS — R002 Palpitations: Secondary | ICD-10-CM | POA: Diagnosis not present

## 2021-05-04 ENCOUNTER — Ambulatory Visit (INDEPENDENT_AMBULATORY_CARE_PROVIDER_SITE_OTHER): Payer: Medicare HMO | Admitting: Internal Medicine

## 2021-05-04 ENCOUNTER — Encounter: Payer: Self-pay | Admitting: Internal Medicine

## 2021-05-04 DIAGNOSIS — Z860100 Personal history of colon polyps, unspecified: Secondary | ICD-10-CM

## 2021-05-04 DIAGNOSIS — E7800 Pure hypercholesterolemia, unspecified: Secondary | ICD-10-CM | POA: Diagnosis not present

## 2021-05-04 DIAGNOSIS — R739 Hyperglycemia, unspecified: Secondary | ICD-10-CM

## 2021-05-04 DIAGNOSIS — I720 Aneurysm of carotid artery: Secondary | ICD-10-CM

## 2021-05-04 DIAGNOSIS — G4733 Obstructive sleep apnea (adult) (pediatric): Secondary | ICD-10-CM

## 2021-05-04 DIAGNOSIS — R000 Tachycardia, unspecified: Secondary | ICD-10-CM | POA: Diagnosis not present

## 2021-05-04 DIAGNOSIS — Z83719 Family history of colon polyps, unspecified: Secondary | ICD-10-CM

## 2021-05-04 DIAGNOSIS — R748 Abnormal levels of other serum enzymes: Secondary | ICD-10-CM

## 2021-05-04 DIAGNOSIS — Z8371 Family history of colonic polyps: Secondary | ICD-10-CM

## 2021-05-04 NOTE — Progress Notes (Signed)
Patient ID: FANCHON PAPANIA, female   DOB: 04-25-1945, 76 y.o.   MRN: [id number] ? ? ?Subjective:  ? ? Patient ID: BELEN PESCH, female    DOB: 01-May-1945, 76 y.o.   MRN: [id number] ? ?This visit occurred during the SARS-CoV-2 public health emergency.  Safety protocols were in place, including screening questions prior to the visit, additional usage of staff PPE, and extensive cleaning of exam room while observing appropriate contact time as indicated for disinfecting solutions.  ? ?Patient here for a scheduled follow up.  ? ?Chief Complaint  ?Patient presents with  ? Follow-up  ?  Follow up for increased heart rate - pt states doing well. Has been recording pressures and heart rate.  ? .  ? ?HPI ?Recently evaluated for increased heart rate.  Saw cardiology.  Had holter placed.  States turned back in yesterday.  She stays active.  No chest pain or sob reported.  Eating.  Watching her diet.  No abdominal pain or bowel change.   ? ? ?Past Medical History:  ?Diagnosis Date  ? Anemia   ? h/o as a child  ? Arthritis   ? left shoulder  ? Cancer ([place]) 15 yrs ago  ? 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 Complication of anesthesia   ? Hemorrhoids   ? History of colonoscopy 2003  ? Normal, Dr. Sammuel Cooper, [place]   ? Hypercholesterolemia   ? Normal exercise sestamibi stress test 2008  ? [place]  ? Obstructive sleep apnea on CPAP 2007  ? PONV (postoperative nausea and vomiting)   ? Rotator cuff tear, left   ? Sleep apnea   ? ?Past Surgical History:  ?Procedure Laterality Date  ? ABDOMINALPLASTY W/ LIPOSUCTION  AGE 34  ? BUNIONECTOMY  X3  LAST ONE 2003  ? CHOLECYSTECTOMY N/A 04/19/2015  ? Procedure: LAPAROSCOPIC CHOLECYSTECTOMY WITH INTRAOPERATIVE CHOLANGIOGRAM;  Surgeon: Robert Bellow, MD;  Location: [facility] ORS;  Service: General;  Laterality: N/A;  ? COLONOSCOPY  2012  ? Dr Wyline Mood in [place]  ? COLONOSCOPY WITH PROPOFOL N/A 09/11/2016  ? Procedure: COLONOSCOPY WITH PROPOFOL;  Surgeon: Lollie Sails, MD;   Location[facility] ENDOSCOPY;  Service: Endoscopy;  Laterality: N/A;  ? ERCP N/A 04/29/2015  ? Procedure: ENDOSCOPIC RETROGRADE CHOLANGIOPANCREATOGRAPHY (ERCP);  Surgeon: Hulen Luster, MD;  Location[facility] END[facility]COPY;  Service: Gastroenterology;  Laterality: N/A;  ? EYE SURGERY    ? cataract extraction  ? NASAL SEPTUM SURGERY  AGE 69  ? NASAL SINUS SURGERY  AGE 43  ? SHOULDER ARTHROSCOPY  10/25/2011  ? Procedure: ARTHROSCOPY SHOULDER;  Surgeon: Magnus Sinning, MD;  Location: [facility];  Service: Orthopedics;  Laterality: Left;  with SAD, shave of the labrium ?  ? TUBAL LIGATION  1982  ? ?Family History  ?Problem Relation Age of Onset  ? Acute myelogenous leukemia Sister   ? Polycythemia Sister 39  ? Emphysema Father   ?     smoker  ? Heart disease Father   ?     myocardial infarction - 69  ? COPD Father   ? Heart attack Father   ? Breast cancer Paternal Aunt   ? Testicular cancer Brother   ? Heart disease Mother   ? Emphysema Mother   ? COPD Mother   ? Asthma Mother   ? Heart attack Mother   ? Leukemia Sister   ? Colon cancer Neg Hx   ? ?Social History  ? ?Socioeconomic History  ?  Marital status: Married  ?  Spouse name: Not on file  ? Number of children: Not on file  ? Years of education: Not on file  ? Highest education level: Not on file  ?Occupational History  ? Not on file  ?Tobacco Use  ? Smoking status: Former  ?  Packs/day: 2.50  ?  Years: 10.00  ?  Pack years: 25.00  ?  Types: Cigarettes  ?  Quit date: 03/28/1977  ?  Years since quitting: 44.1  ? Smokeless tobacco: Never  ?Substance and Sexual Activity  ? Alcohol use: No  ?  Alcohol/week: 0.0 standard drinks  ? Drug use: No  ? Sexual activity: Not Currently  ?  Birth control/protection: Post-menopausal  ?Other Topics Concern  ? Not on file  ?Social History Narrative  ? Not on file  ? ?Social Determinants of Health  ? ?Financial Resource Strain: Low Risk   ? Difficulty of Paying Living Expenses: Not hard at all  ?Food Insecurity: No Food Insecurity   ? Worried About Charity fundraiser in the Last Year: Never true  ? Ran Out of Food in the Last Year: Never true  ?Transportation Needs: No Transportation Needs  ? Lack of Transportation (Medical): No  ? Lack of Transportation (Non-Medical): No  ?Physical Activity: Sufficiently Active  ? Days of Exercise per Week: 5 days  ? Minutes of Exercise per Session: 30 min  ?Stress: No Stress Concern Present  ? Feeling of Stress : Not at all  ?Social Connections: Unknown  ? Frequency of Communication with Friends and Family: Not on file  ? Frequency of Social Gatherings with Friends and Family: Not on file  ? Attends Religious Services: Not on file  ? Active Member of Clubs or Organizations: Not on file  ? Attends Archivist Meetings: Not on file  ? Marital Status: Married  ? ? ? ?Review of Systems  ?Constitutional:  Negative for appetite change and unexpected weight change.  ?HENT:  Negative for congestion and sinus pressure.   ?Respiratory:  Negative for cough, chest tightness and shortness of breath.   ?Cardiovascular:  Negative for chest pain, palpitations and leg swelling.  ?Gastrointestinal:  Negative for abdominal pain, diarrhea, nausea and vomiting.  ?Genitourinary:  Negative for difficulty urinating and dysuria.  ?Musculoskeletal:  Negative for joint swelling and myalgias.  ?Skin:  Negative for color change and rash.  ?Neurological:  Negative for dizziness, light-headedness and headaches.  ?Psychiatric/Behavioral:  Negative for agitation and dysphoric mood.   ? ?   ?Objective:  ?  ? ?BP 136/70 (BP Location: Left Arm, Patient Position: Sitting, Cuff Size: Small)   Pulse 62   Temp 97.9 ?F (36.6 ?C) (Temporal)   Resp 15   Ht '5\' 6"'$  (1.676 m)   Wt 134 lb 9.6 oz (61.1 kg)   SpO2 95%   BMI 21.73 kg/m?  ?Wt Readings from Last 3 Encounters:  ?05/06/21 134 lb (60.8 kg)  ?05/04/21 134 lb 9.6 oz (61.1 kg)  ?03/22/21 133 lb 9.6 oz (60.6 kg)  ? ? ?Physical Exam ?Vitals reviewed.  ?Constitutional:   ?   General:  She is not in acute distress. ?   Appearance: Normal appearance.  ?HENT:  ?   Head: Normocephalic and atraumatic.  ?   Right Ear: External ear normal.  ?   Left Ear: External ear normal.  ?Eyes:  ?   General: No scleral icterus.    ?   Right eye: No discharge.     ?  Left eye: No discharge.  ?   Conjunctiva/sclera: Conjunctivae normal.  ?Neck:  ?   Thyroid: No thyromegaly.  ?Cardiovascular:  ?   Rate and Rhythm: Normal rate and regular rhythm.  ?Pulmonary:  ?   Effort: No respiratory distress.  ?   Breath sounds: Normal breath sounds. No wheezing.  ?Abdominal:  ?   General: Bowel sounds are normal.  ?   Palpations: Abdomen is soft.  ?   Tenderness: There is no abdominal tenderness.  ?Musculoskeletal:     ?   General: No swelling or tenderness.  ?   Cervical back: Neck supple. No tenderness.  ?Lymphadenopathy:  ?   Cervical: No cervical adenopathy.  ?Skin: ?   Findings: No erythema or rash.  ?Neurological:  ?   Mental Status: She is alert.  ?Psychiatric:     ?   Mood and Affect: Mood normal.     ?   Behavior: Behavior normal.  ? ? ? ?Outpatient Encounter Medications as of 05/04/2021  ?Medication Sig  ? aspirin EC 81 MG tablet Take 81 mg by mouth daily.  ? latanoprost (XALATAN) 0.005 % ophthalmic solution Place 1 drop into both eyes.  ? MAGNESIUM PO Take by mouth.  ? Multiple Vitamin (MULTIVITAMIN) capsule Take 1 capsule by mouth daily.  ? pravastatin (PRAVACHOL) 10 MG tablet TAKE 1 TABLET BY MOUTH EVERY DAY  ? ?No facility-administered encounter medications on file as of 05/04/2021.  ?  ? ?Lab Results  ?Component Value Date  ? WBC 4.9 03/22/2021  ? HGB 13.1 03/22/2021  ? HCT 38.5 03/22/2021  ? PLT 190.0 03/22/2021  ? GLUCOSE 109 (H) 03/22/2021  ? CHOL 161 01/11/2021  ? TRIG 152.0 (H) 01/11/2021  ? HDL 42.80 01/11/2021  ? LDLDIRECT 88.0 07/22/2014  ? [place] 88 01/11/2021  ? ALT 14 01/11/2021  ? AST 23 01/11/2021  ? NA 138 03/22/2021  ? K 4.4 03/22/2021  ? CL 102 03/22/2021  ? CREATININE 0.92 03/22/2021  ? BUN 15  03/22/2021  ? CO2 29 03/22/2021  ? TSH 2.46 03/22/2021  ? HGBA1C 5.7 01/11/2021  ? ? ?MR ANGIO HEAD WO CONTRAST ? ?Result Date: 11/06/2020 ?CLINICAL DATA:  [place]. EXAM: MRA HEAD WITHOUT CONTRAST TECHNIQUE: Angiogr

## 2021-05-05 ENCOUNTER — Telehealth: Payer: Self-pay

## 2021-05-05 NOTE — Telephone Encounter (Signed)
Lm for patient to ask if she has had previous sleep study.  

## 2021-05-06 ENCOUNTER — Ambulatory Visit (INDEPENDENT_AMBULATORY_CARE_PROVIDER_SITE_OTHER): Payer: Medicare HMO

## 2021-05-06 VITALS — Ht 66.0 in | Wt 134.0 lb

## 2021-05-06 DIAGNOSIS — Z0000 Encounter for general adult medical examination without abnormal findings: Secondary | ICD-10-CM | POA: Diagnosis not present

## 2021-05-06 NOTE — Patient Instructions (Addendum)
?  Dawn Wolfe , ?Thank you for taking time to come for your Medicare Wellness Visit. I appreciate your ongoing commitment to your health goals. Please review the following plan we discussed and let me know if I can assist you in the future.  ? ?These are the goals we discussed: ? Goals   ? ?  ? Patient Stated  ?   Maintain Weight (pt-stated)   ?   Stay active. ?Stay hydrated. ?Healthy diet. ?  ? ?  ?  ?This is a list of the screening recommended for you and due dates:  ?Health Maintenance  ?Topic Date Due  ? COVID-19 Vaccine (4 - Booster for Pfizer series) 07/16/2021*  ? Flu Shot  08/16/2021  ? Mammogram  09/01/2021  ? Tetanus Vaccine  02/23/2025  ? Colon Cancer Screening  09/12/2026  ? Pneumonia Vaccine  Completed  ? DEXA scan (bone density measurement)  Completed  ? Hepatitis C Screening: USPSTF Recommendation to screen - Ages 38-79 yo.  Completed  ? Zoster (Shingles) Vaccine  Completed  ? HPV Vaccine  Aged Out  ?*Topic was postponed. The date shown is not the original due date.  ?  ?

## 2021-05-06 NOTE — Telephone Encounter (Signed)
Spoke to patient. She stated that she has sleep study 16 years ago. She wears cpap nightly. She will bring SD card. ?Nothing further needed.  ? ?

## 2021-05-06 NOTE — Progress Notes (Signed)
Subjective:   Dawn Wolfe is a 76 y.o. female who presents for Medicare Annual (Subsequent) preventive examination.  Review of Systems    No ROS.  Medicare Wellness Virtual Visit.  Visual/audio telehealth visit, UTA vital signs.   See social history for additional risk factors.   Cardiac Risk Factors include: advanced age (>60men, >79 women)     Objective:    Today's Vitals   05/06/21 1522  Weight: 134 lb (60.8 kg)  Height: 5\' 6"  (1.676 m)   Body mass index is 21.63 kg/m.     05/06/2021    3:31 PM 05/05/2020   10:43 AM 05/02/2019   10:38 AM 05/01/2018   10:02 AM 09/11/2016    1:32 PM 04/29/2015    2:01 PM 11/26/2014    9:21 AM  Advanced Directives  Does Patient Have a Medical Advance Directive? Yes Yes Yes Yes Yes Yes Yes  Type of Advance Directive Living will Healthcare Power of [place];Living will Healthcare Power of [employer] of [place];Living will Healthcare Power of [place];Living will Healthcare Power of [place];Living will Living will;Healthcare Power of Attorney  Does patient want to make changes to medical advance directive? No - Patient declined No - Patient declined No - Patient declined No - Patient declined   No - Patient declined  Copy of Healthcare Power of Attorney in Chart?  No - copy requested No - copy requested No - copy requested No - copy requested No - copy requested No - copy requested   Current Medications (verified) Outpatient Encounter Medications as of 05/06/2021  Medication Sig   aspirin EC 81 MG tablet Take 81 mg by mouth daily.   latanoprost (XALATAN) 0.005 % ophthalmic solution Place 1 drop into both eyes.   MAGNESIUM PO Take by mouth.   Multiple Vitamin (MULTIVITAMIN) capsule Take 1 capsule by mouth daily.   pravastatin (PRAVACHOL) 10 MG tablet TAKE 1 TABLET BY MOUTH EVERY DAY   No facility-administered encounter medications on file as of 05/06/2021.    Allergies (verified) Codeine   History: Past Medical  History:  Diagnosis Date   Anemia    h/o as a child   Arthritis    left shoulder   Cancer ([facility]) 15 yrs ago   [employer] on the face. Surgical removal   Complication of anesthesia    Hemorrhoids    History of colonoscopy 2003   Normal, Dr. Sherin Quarry, [place]    Hypercholesterolemia    Normal exercise sestamibi stress test 2008   [facility] Fath   Obstructive sleep apnea on CPAP 2007   PONV (postoperative nausea and vomiting)    Rotator cuff tear, left    Sleep apnea    Past Surgical History:  Procedure Laterality Date   ABDOMINALPLASTY W/ LIPOSUCTION  AGE 16   BUNIONECTOMY  X3  LAST ONE 2003   CHOLECYSTECTOMY N/A 04/19/2015   Procedure: LAPAROSCOPIC CHOLECYSTECTOMY WITH INTRAOPERATIVE CHOLANGIOGRAM;  Surgeon: Earline Mayotte, MD;  Location: [facility] ORS;  Service: General;  Laterality: N/A;   COLONOSCOPY  2012   Dr Robin Searing in [place]   COLONOSCOPY WITH PROPOFOL N/A 09/11/2016   Procedure: COLONOSCOPY WITH PROPOFOL;  Surgeon: Christena Deem, MD;  Location: [facility] ENDOSCOPY;  Service: Endoscopy;  Laterality: N/A;   ERCP N/A 04/29/2015   Procedure: ENDOSCOPIC RETROGRADE CHOLANGIOPANCREATOGRAPHY (ERCP);  Surgeon: Wallace Cullens, MD;  Location: [facility] ENDOSCOPY;  Service: Gastroenterology;  Laterality: N/A;   EYE SURGERY     cataract extraction   NASAL SEPTUM SURGERY  AGE 10   NASAL SINUS SURGERY  AGE 16   SHOULDER ARTHROSCOPY  10/25/2011   Procedure: ARTHROSCOPY SHOULDER;  Surgeon: Drucilla Schmidt, MD;  Location: [facility] [place];  Service: Orthopedics;  Laterality: Left;  with SAD, shave of the labrium    TUBAL LIGATION  1982   Family History  Problem Relation Age of Onset   Acute myelogenous leukemia Sister    Polycythemia Sister 57   Emphysema Father        smoker   Heart disease Father        myocardial infarction - 16   COPD Father    Heart attack Father    Breast cancer Paternal Aunt    Testicular cancer Brother    Heart disease Mother    Emphysema Mother    COPD  Mother    Asthma Mother    Heart attack Mother    Leukemia Sister    Colon cancer Neg Hx    Social History   Socioeconomic History   Marital status: Married    Spouse name: Not on file   Number of children: Not on file   Years of education: Not on file   Highest education level: Not on file  Occupational History   Not on file  Tobacco Use   Smoking status: Former    Packs/day: 2.50    Years: 10.00    Pack years: 25.00    Types: Cigarettes    Quit date: 03/28/1977    Years since quitting: 44.1   Smokeless tobacco: Never  Substance and Sexual Activity   Alcohol use: No    Alcohol/week: 0.0 standard drinks   Drug use: No   Sexual activity: Not Currently    Birth control/protection: Post-menopausal  Other Topics Concern   Not on file  Social History Narrative   Not on file   Social Determinants of Health   Financial Resource Strain: Low Risk    Difficulty of Paying Living Expenses: Not hard at all  Food Insecurity: No Food Insecurity   Worried About Programme researcher, broadcasting/film/video in the Last Year: Never true   Ran Out of Food in the Last Year: Never true  Transportation Needs: No Transportation Needs   Lack of Transportation (Medical): No   Lack of Transportation (Non-Medical): No  Physical Activity: Sufficiently Active   Days of Exercise per Week: 5 days   Minutes of Exercise per Session: 30 min  Stress: No Stress Concern Present   Feeling of Stress : Not at all  Social Connections: Unknown   Frequency of Communication with Friends and Family: Not on file   Frequency of Social Gatherings with Friends and Family: Not on file   Attends Religious Services: Not on Scientist, clinical (histocompatibility and immunogenetics) or Organizations: Not on file   Attends Banker Meetings: Not on file   Marital Status: Married   Tobacco Counseling Counseling given: Not Answered   Clinical Intake:  Pre-visit preparation completed: Yes        Diabetes: No  How often do you need to have  someone help you when you read instructions, pamphlets, or other written materials from your doctor or pharmacy?: 1 - Never  Interpreter Needed?: No    Activities of Daily Living    05/06/2021    3:44 PM  In your present state of health, do you have any difficulty performing the following activities:  Hearing? 0  Vision? 0  Difficulty concentrating or making decisions?  0  Walking or climbing stairs? 0  Dressing or bathing? 0  Doing errands, shopping? 0  Preparing Food and eating ? N  Using the Toilet? N  In the past six months, have you accidently leaked urine? N  Do you have problems with loss of bowel control? N  Managing your Medications? N  Managing your Finances? N  Housekeeping or managing your Housekeeping? N   Patient Care Team: Dale Ojus, MD as PCP - General (Internal Medicine) Dale Le Roy, MD (Internal Medicine) Lemar Livings Merrily Pew, MD (General Surgery)  Indicate any recent Medical Services you may have received from other than [facility] providers in the past year (date may be approximate).     Assessment:   This is a routine wellness examination for Rasheena.  Virtual Visit via Telephone Note  I connected with  Dawn Wolfe on 05/06/21 at  3:45 PM EDT by telephone and verified that I am speaking with the correct person using two identifiers.  Persons participating in the virtual visit: patient/Nurse Health Advisor   I discussed the limitations of performing an evaluation and management service by telehealth. The patient expressed understanding and agreed to proceed. We continued and completed visit with audio only. Some vital signs may be absent or patient reported.   Hearing/Vision screen Hearing Screening - Comments:: Patient is able to hear conversational tones without difficulty. No issues reported. Vision Screening - Comments:: Followed by Dr. Alvester Morin  Wears corrective lenses Glaucoma suspect; drops in L eye  Ophthalmologist visits every 3 months.    Dietary issues and exercise activities discussed: Current Exercise Habits: Home exercise routine, Intensity: Mild Healthy diet Good water intake   Goals Addressed               This Visit's Progress     Patient Stated     Maintain Weight (pt-stated)        Stay active. Stay hydrated. Healthy diet.       Depression Screen    05/06/2021    3:29 PM 11/10/2020   10:22 AM 05/05/2020   10:44 AM 05/02/2019   10:49 AM 05/01/2018   10:55 AM 08/16/2017   11:40 AM 08/10/2016    8:35 AM  PHQ 2/9 Scores  PHQ - 2 Score 0 0 0 0 0 0 0  PHQ- 9 Score       0    Fall Risk    05/06/2021    3:37 PM 11/10/2020   10:22 AM 07/09/2020    9:45 AM 05/05/2020   10:47 AM 05/02/2019   10:48 AM  Fall Risk   Falls in the past year? 0 0 1 0 0  Number falls in past yr: 0 0 0 0   Injury with Fall?  0 0 0   Risk for fall due to :  No Fall Risks History of fall(s)    Follow up Falls evaluation completed Falls evaluation completed Falls evaluation completed Falls evaluation completed Falls evaluation completed   FALL RISK PREVENTION PERTAINING TO THE HOME: Home free of loose throw rugs in walkways, pet beds, electrical cords, etc? Yes  Adequate lighting in your home to reduce risk of falls? Yes   ASSISTIVE DEVICES UTILIZED TO PREVENT FALLS: Life alert? No  Use of a cane, walker or w/c? No   TIMED UP AND GO: Was the test performed? No .   Cognitive Function: Patient is alert and oriented x3.     11/26/2014    9:32 AM  MMSE -  Mini Mental State Exam  Orientation to time 5  Orientation to Place 5  Registration 3  Attention/ Calculation 5  Recall 3  Language- name 2 objects 2  Language- repeat 1  Language- follow 3 step command 3  Language- read & follow direction 1  Write a sentence 1  Copy design 1  Total score 30        05/02/2019   10:50 AM 05/01/2018   10:56 AM  6CIT Screen  What Year? 0 points 0 points  What month? 0 points 0 points  What time?  0 points  Count back from  20 0 points 0 points  Months in reverse  0 points  Repeat phrase  0 points  Total Score  0 points   Immunizations Immunization History  Administered Date(s) Administered   Fluad Quad(high Dose 65+) 09/19/2019, 10/28/2020   Influenza Split 10/26/2010, 10/16/2011, 10/21/2012, 08/22/2013   Influenza, High Dose Seasonal PF 10/18/2016, 10/14/2018   Influenza-Unspecified 10/08/2014, 10/20/2015, 10/24/2017   PFIZER(Purple Top)SARS-COV-2 Vaccination 03/02/2019, 04/01/2019, 11/13/2019   Pneumococcal Conjugate-13 11/26/2014   Pneumococcal Polysaccharide-23 12/07/2015   Tdap 02/24/2015   Zoster Recombinat (Shingrix) 03/08/2020, 07/20/2020   Zoster, Live 04/10/2013   Screening Tests Health Maintenance  Topic Date Due   COVID-19 Vaccine (4 - Booster for Pfizer series) 07/16/2021 (Originally 01/08/2020)   INFLUENZA VACCINE  08/16/2021   MAMMOGRAM  09/01/2021   TETANUS/TDAP  02/23/2025   COLONOSCOPY (Pts 45-32yrs Insurance coverage will need to be confirmed)  09/12/2026   Pneumonia Vaccine 21+ Years old  Completed   DEXA SCAN  Completed   Hepatitis C Screening  Completed   Zoster Vaccines- Shingrix  Completed   HPV VACCINES  Aged Out   Health Maintenance There are no preventive care reminders to display for this patient.  Lung Cancer Screening: (Low Dose CT Chest recommended if Age 38-80 years, 30 pack-year currently smoking OR have quit w/in 15years.) does not qualify.   Vision Screening: Recommended annual ophthalmology exams for early detection of glaucoma and other disorders of the eye.  Dental Screening: Recommended annual dental exams for proper oral hygiene  Community Resource Referral / Chronic Care Management: CRR required this visit?  No   CCM required this visit?  No      Plan:   Keep all routine maintenance appointments.   I have personally reviewed and noted the following in the patient's chart:   Medical and social history Use of alcohol, tobacco or illicit drugs   Current medications and supplements including opioid prescriptions.  Functional ability and status Nutritional status Physical activity Advanced directives List of other physicians Hospitalizations, surgeries, and ER visits in previous 12 months Vitals Screenings to include cognitive, depression, and falls Referrals and appointments  In addition, I have reviewed and discussed with patient certain preventive protocols, quality metrics, and best practice recommendations. A written personalized care plan for preventive services as well as general preventive health recommendations were provided to patient.     Ashok Pall, LPN   02/15/8655

## 2021-05-08 ENCOUNTER — Encounter: Payer: Self-pay | Admitting: Internal Medicine

## 2021-05-08 NOTE — Assessment & Plan Note (Signed)
Had the episode recently of increased heart rate.  Saw cardiology.  Returned monitor yesterday.  No further increased episodes.  Follow.  ?

## 2021-05-08 NOTE — Assessment & Plan Note (Addendum)
MRA as outlined.  Saw neurology.  Keep blood pressure under good control.  Reviewed with neurology - recommended f/u MRA in one year.   ?

## 2021-05-08 NOTE — Assessment & Plan Note (Signed)
Continues to watch diet.  Follow met b and a1c.   ?Lab Results  ?Component Value Date  ? HGBA1C 5.7 01/11/2021  ? ?

## 2021-05-08 NOTE — Assessment & Plan Note (Signed)
Colonoscopy 08/2016.  Per pt - due.  Refer back to GI.  

## 2021-05-08 NOTE — Assessment & Plan Note (Signed)
Has been using cpap.  Has been referred to pulmonary.  ?

## 2021-05-08 NOTE — Assessment & Plan Note (Signed)
Had colonoscopy 08/2016 - sharp turn.  Subsequent ACBE.  

## 2021-05-08 NOTE — Assessment & Plan Note (Signed)
Previous abdominal ultrasound - changes c/w fatty liver.  She has adjusted her diet.  Lost weight.  Follow liver function tests.   

## 2021-05-08 NOTE — Assessment & Plan Note (Signed)
On pravastatin.  Low cholesterol diet and exercise.  Follow lipid panel and liver function tests.   ?Lab Results  ?Component Value Date  ? CHOL 161 01/11/2021  ? HDL 42.80 01/11/2021  ? [employer] 88 01/11/2021  ? LDLDIRECT 88.0 07/22/2014  ? TRIG 152.0 (H) 01/11/2021  ? CHOLHDL 4 01/11/2021  ? ?

## 2021-05-09 ENCOUNTER — Institutional Professional Consult (permissible substitution): Payer: Medicare HMO | Admitting: Primary Care

## 2021-05-30 DIAGNOSIS — H40153 Residual stage of open-angle glaucoma, bilateral: Secondary | ICD-10-CM | POA: Diagnosis not present

## 2021-05-30 DIAGNOSIS — H524 Presbyopia: Secondary | ICD-10-CM | POA: Diagnosis not present

## 2021-06-06 ENCOUNTER — Institutional Professional Consult (permissible substitution): Payer: Medicare HMO | Admitting: Primary Care

## 2021-06-06 DIAGNOSIS — Z03818 Encounter for observation for suspected exposure to other biological agents ruled out: Secondary | ICD-10-CM | POA: Diagnosis not present

## 2021-06-06 DIAGNOSIS — J019 Acute sinusitis, unspecified: Secondary | ICD-10-CM | POA: Diagnosis not present

## 2021-06-06 DIAGNOSIS — R051 Acute cough: Secondary | ICD-10-CM | POA: Diagnosis not present

## 2021-07-06 DIAGNOSIS — R002 Palpitations: Secondary | ICD-10-CM | POA: Diagnosis not present

## 2021-07-29 ENCOUNTER — Other Ambulatory Visit: Payer: Self-pay | Admitting: Internal Medicine

## 2021-07-29 DIAGNOSIS — Z1231 Encounter for screening mammogram for malignant neoplasm of breast: Secondary | ICD-10-CM

## 2021-08-12 ENCOUNTER — Other Ambulatory Visit (INDEPENDENT_AMBULATORY_CARE_PROVIDER_SITE_OTHER): Payer: Medicare HMO

## 2021-08-12 DIAGNOSIS — R739 Hyperglycemia, unspecified: Secondary | ICD-10-CM | POA: Diagnosis not present

## 2021-08-12 DIAGNOSIS — E7800 Pure hypercholesterolemia, unspecified: Secondary | ICD-10-CM

## 2021-08-12 LAB — HEPATIC FUNCTION PANEL
ALT: 15 U/L (ref 0–35)
AST: 22 U/L (ref 0–37)
Albumin: 4.4 g/dL (ref 3.5–5.2)
Alkaline Phosphatase: 54 U/L (ref 39–117)
Bilirubin, Direct: 0.1 mg/dL (ref 0.0–0.3)
Total Bilirubin: 0.7 mg/dL (ref 0.2–1.2)
Total Protein: 7.1 g/dL (ref 6.0–8.3)

## 2021-08-12 LAB — BASIC METABOLIC PANEL
BUN: 18 mg/dL (ref 6–23)
CO2: 29 mEq/L (ref 19–32)
Calcium: 9.6 mg/dL (ref 8.4–10.5)
Chloride: 105 mEq/L (ref 96–112)
Creatinine, Ser: 0.88 mg/dL (ref 0.40–1.20)
GFR: 64.53 mL/min (ref >60.00)
Glucose, Bld: 100 mg/dL — ABNORMAL HIGH (ref 70–99)
Potassium: 4.4 mEq/L (ref 3.5–5.1)
Sodium: 141 mEq/L (ref 135–145)

## 2021-08-12 LAB — LIPID PANEL
Cholesterol: 133 mg/dL (ref 0–200)
HDL: 42 mg/dL (ref >39.00)
LDL Cholesterol: 71 mg/dL (ref 0–99)
NonHDL: 91.28
Total CHOL/HDL Ratio: 3
Triglycerides: 103 mg/dL (ref 0.0–149.0)
VLDL: 20.6 mg/dL (ref 0.0–40.0)

## 2021-08-12 LAB — HEMOGLOBIN A1C: Hgb A1c MFr Bld: 5.8 % (ref 4.6–6.5)

## 2021-08-16 ENCOUNTER — Encounter: Payer: Self-pay | Admitting: Adult Health

## 2021-08-16 ENCOUNTER — Ambulatory Visit (INDEPENDENT_AMBULATORY_CARE_PROVIDER_SITE_OTHER): Payer: Medicare HMO | Admitting: Adult Health

## 2021-08-16 VITALS — BP 126/68 | HR 99 | Temp 97.8°F | Ht 66.0 in | Wt 134.8 lb

## 2021-08-16 DIAGNOSIS — Z8669 Personal history of other diseases of the nervous system and sense organs: Secondary | ICD-10-CM | POA: Diagnosis not present

## 2021-08-16 DIAGNOSIS — G4733 Obstructive sleep apnea (adult) (pediatric): Secondary | ICD-10-CM | POA: Diagnosis not present

## 2021-08-16 NOTE — Patient Instructions (Signed)
Continue on CPAP At bedtime   Wear all night long.  Set up for Home sleep study.  Do not drive if sleepy.  Healthy sleep regimen  Follow up in 6 weeks to discuss test results and treatment plan .

## 2021-08-16 NOTE — Assessment & Plan Note (Signed)
Long history of obstructive sleep apnea.  Sounds like patient had moderate OSA and has been doing excellent on CPAP.  Her CPAP download shows excellent control and compliance.  Patient has lost some weight and BMI is down to 21.  Seems that her snoring has resolved.  She would like to check to see if she still has sleep apnea.  We will set up for home sleep study to evaluate if sleep apnea has resolved with weight loss.   Patient education on sleep apnea and CPAP care.  We will continue CPAP therapy until sleep study results are available  Plan  Patient Instructions  Continue on CPAP At bedtime   Wear all night long.  Set up for Home sleep study.  Do not drive if sleepy.  Healthy sleep regimen  Follow up in 6 weeks to discuss test results and treatment plan .

## 2021-08-16 NOTE — Progress Notes (Signed)
$'@Patient'Q$  ID: Dawn Wolfe, female    DOB: 02-16-1945, 76 y.o.   MRN: [id number]  Chief Complaint  Patient presents with   sleep consult    Referring provider: Einar Pheasant, MD  HPI: 76 year old female seen for sleep consult August 16, 2021 to establish for sleep apnea  TEST/EVENTS :   08/16/2021 Sleep Consult  Patient presents for a sleep consult today.  Kindly referred by Dr. Nicki Reaper.  Patient was diagnosed with sleep apnea around 16 years ago. Was started on CPAP by ENT . Was told Moderate OSA with 27 events an hour per patient . Wears her CPAP every single night gets in about 8 hours of sleep.  She says that she feels that her sleep apnea is went away and would like to get a new sleep study. She says she lost over 20lbs in last 3 years . Eating healthy and exercising. No longer snoring.  CPAP download shows excellent compliance with 100% usage.  Daily average usage around 8 hours.  Patient is on CPAP 9 cm H2O.  AHI is 0.4/hour. Typically goes to bed about 11 PM.  Goes to sleep very quickly.  Gets up about 7:30 AM.  Takes nap daily . Can fall asleep easily . Feels good when she wakes up in am. She never misses a night with her CPAP.  No removable dental work.  Caffeine intake 3 cups of coffee. No sleep aides.  Epworth score is 8 out 24, gets sleep watching TV and evening.  Had had 3 CPAP machines over the years. Uses a nasal mask.   Had a TIA in 2019. No major Stroke in past. No CHF . No removable dental work.  Very active. Does a lot of yard work.   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 No kids. Former smoker , quit 1979. No alcohol.  RetiredInformation systems manager work.   Family History  Problem Relation Age of Onset   Acute myelogenous leukemia Sister    Polycythemia Sister 107   Emphysema Father         smoker   Heart disease Father        myocardial infarction - 16   COPD Father    Heart attack Father    Breast cancer Paternal Aunt    Testicular cancer Brother    Heart disease Mother    Emphysema Mother    COPD Mother    Asthma Mother    Heart attack Mother    Leukemia Sister    Colon cancer Neg Hx     Allergies  Allergen Reactions   Codeine Nausea And Vomiting    Immunization History  Administered Date(s) Administered   Fluad Quad(high Dose 65+) 09/19/2019, 10/28/2020   Influenza Split 10/26/2010, 10/16/2011, 10/21/2012, 08/22/2013   Influenza, High Dose Seasonal PF 10/18/2016, 10/14/2018   Influenza-Unspecified 10/08/2014, 10/20/2015, 10/24/2017   PFIZER(Purple Top)SARS-COV-2 Vaccination 03/02/2019, 04/01/2019, 11/13/2019   Pneumococcal Conjugate-13 11/26/2014   Pneumococcal Polysaccharide-23 12/07/2015   Tdap 02/24/2015   Zoster Recombinat (Shingrix) 03/08/2020, 07/20/2020   Zoster, Live 04/10/2013    Past Medical History:  Diagnosis Date   Anemia    h/o as a child   Arthritis    left shoulder   Cancer ([place]) 15 yrs ago   [employer] on the face. Surgical removal  Complication of anesthesia    Hemorrhoids    History of colonoscopy 2003   Normal, Dr. Sammuel Cooper, [place]    Hypercholesterolemia    Normal exercise sestamibi stress test 2008   Ken Fath   Obstructive sleep apnea on CPAP 2007   PONV (postoperative nausea and vomiting)    Rotator cuff tear, left    Sleep apnea     Tobacco History: Social History   Tobacco Use  Smoking Status Former   Packs/day: 2.50   Years: 10.00   Total pack years: 25.00   Types: Cigarettes   Quit date: 03/28/1977   Years since quitting: 44.4  Smokeless Tobacco Never   Counseling given: Not Answered   Outpatient Medications Prior to Visit  Medication Sig Dispense Refill   aspirin EC 81 MG tablet Take 81 mg by mouth daily.     latanoprost (XALATAN) 0.005 % ophthalmic solution Place 1 drop into both eyes.  3    MAGNESIUM PO Take by mouth.     Multiple Vitamin (MULTIVITAMIN) capsule Take 1 capsule by mouth daily.     pravastatin (PRAVACHOL) 10 MG tablet TAKE 1 TABLET BY MOUTH EVERY DAY 90 tablet 1   No facility-administered medications prior to visit.     Review of Systems:   Constitutional:   No  weight loss, night sweats,  Fevers, chills, fatigue, or  lassitude.  HEENT:   No headaches,  Difficulty swallowing,  Tooth/dental problems, or  Sore throat,                No sneezing, itching, ear ache, nasal congestion, post nasal drip,   CV:  No chest pain,  Orthopnea, PND, swelling in lower extremities, anasarca, dizziness, palpitations, syncope.   GI  No heartburn, indigestion, abdominal pain, nausea, vomiting, diarrhea, change in bowel habits, loss of appetite, bloody stools.   Resp: No shortness of breath with exertion or at rest.  No excess mucus, no productive cough,  No non-productive cough,  No coughing up of blood.  No change in color of mucus.  No wheezing.  No chest wall deformity  Skin: no rash or lesions.  GU: no dysuria, change in color of urine, no urgency or frequency.  No flank pain, no hematuria   MS:  No joint pain or swelling.  No decreased range of motion.  No back pain.    Physical Exam  BP 126/68 (BP Location: Left Arm, Cuff Size: Normal)   Pulse 99   Temp 97.8 F (36.6 C) (Temporal)   Ht '5\' 6"'$  (1.676 m)   Wt 134 lb 12.8 oz (61.1 kg)   SpO2 97%   BMI 21.76 kg/m   GEN: A/Ox3; pleasant , NAD, well nourished    HEENT:  [place]/AT,  NOSE-clear, THROAT-clear, no lesions, no postnasal drip or exudate noted. Class 3 MP airway   NECK:  Supple w/ fair ROM; no JVD; normal carotid impulses w/o bruits; no thyromegaly or nodules palpated; no lymphadenopathy.    RESP  Clear  P & A; w/o, wheezes/ rales/ or rhonchi. no accessory muscle use, no dullness to percussion  CARD:  RRR, no m/r/g, no peripheral edema, pulses intact, no cyanosis or clubbing.  GI:   Soft & nt; nml bowel  sounds; no organomegaly or masses detected.   Musco: Warm bil, no deformities or joint swelling noted.   Neuro: alert, no focal deficits noted.    Skin: Warm, no lesions or rashes    Lab Results:  CBC  Component Value Date/Time   WBC 4.9 03/22/2021 1017   RBC 4.39 03/22/2021 1017   HGB 13.1 03/22/2021 1017   HCT 38.5 03/22/2021 1017   PLT 190.0 03/22/2021 1017   MCV 87.7 03/22/2021 1017   MCHC 34.1 03/22/2021 1017   RDW 13.7 03/22/2021 1017   LYMPHSABS 1.6 03/22/2021 1017   MONOABS 0.4 03/22/2021 1017   EOSABS 0.2 03/22/2021 1017   BASOSABS 0.0 03/22/2021 1017    BMET    Component Value Date/Time   NA 141 08/12/2021 0733   K 4.4 08/12/2021 0733   CL 105 08/12/2021 0733   CO2 29 08/12/2021 0733   GLUCOSE 100 (H) 08/12/2021 0733   BUN 18 08/12/2021 0733   CREATININE 0.88 08/12/2021 0733   CALCIUM 9.6 08/12/2021 0733    BNP No results found for: "BNP"  ProBNP No results found for: "PROBNP"  Imaging: No results found.        No data to display          No results found for: "NITRICOXIDE"      Assessment & Plan:   Obstructive sleep apnea Long history of obstructive sleep apnea.  Sounds like patient had moderate OSA and has been doing excellent on CPAP.  Her CPAP download shows excellent control and compliance.  Patient has lost some weight and BMI is down to 21.  Seems that her snoring has resolved.  She would like to check to see if she still has sleep apnea.  We will set up for home sleep study to evaluate if sleep apnea has resolved with weight loss.   Patient education on sleep apnea and CPAP care.  We will continue CPAP therapy until sleep study results are available  Plan  Patient Instructions  Continue on CPAP At bedtime   Wear all night long.  Set up for Home sleep study.  Do not drive if sleepy.  Healthy sleep regimen  Follow up in 6 weeks to discuss test results and treatment plan .        Rexene Edison, NP 08/16/2021

## 2021-08-17 ENCOUNTER — Encounter: Payer: Self-pay | Admitting: Internal Medicine

## 2021-08-17 ENCOUNTER — Ambulatory Visit (INDEPENDENT_AMBULATORY_CARE_PROVIDER_SITE_OTHER): Payer: Medicare HMO | Admitting: Internal Medicine

## 2021-08-17 VITALS — BP 116/72 | HR 82 | Temp 98.2°F | Resp 15 | Ht 66.0 in | Wt 132.6 lb

## 2021-08-17 DIAGNOSIS — R000 Tachycardia, unspecified: Secondary | ICD-10-CM | POA: Diagnosis not present

## 2021-08-17 DIAGNOSIS — R748 Abnormal levels of other serum enzymes: Secondary | ICD-10-CM

## 2021-08-17 DIAGNOSIS — Z8371 Family history of colonic polyps: Secondary | ICD-10-CM

## 2021-08-17 DIAGNOSIS — Z860100 Personal history of colon polyps, unspecified: Secondary | ICD-10-CM

## 2021-08-17 DIAGNOSIS — Z83719 Family history of colon polyps, unspecified: Secondary | ICD-10-CM

## 2021-08-17 DIAGNOSIS — I720 Aneurysm of carotid artery: Secondary | ICD-10-CM | POA: Diagnosis not present

## 2021-08-17 DIAGNOSIS — G4733 Obstructive sleep apnea (adult) (pediatric): Secondary | ICD-10-CM | POA: Diagnosis not present

## 2021-08-17 DIAGNOSIS — E7800 Pure hypercholesterolemia, unspecified: Secondary | ICD-10-CM | POA: Diagnosis not present

## 2021-08-17 DIAGNOSIS — R739 Hyperglycemia, unspecified: Secondary | ICD-10-CM | POA: Diagnosis not present

## 2021-08-17 DIAGNOSIS — Z0000 Encounter for general adult medical examination without abnormal findings: Secondary | ICD-10-CM

## 2021-08-17 MED ORDER — PRAVASTATIN SODIUM 10 MG PO TABS: 10.0000 mg | ORAL_TABLET | Freq: Every day | ORAL | 3 refills | Status: DC

## 2021-08-17 NOTE — Assessment & Plan Note (Signed)
Physical today 08/17/21.  Mammogram 09/03/20 - Birads I.  Colonoscopy 08/2016.

## 2021-08-17 NOTE — Progress Notes (Signed)
Patient ID: Dawn Wolfe, female   DOB: 1945-08-24, 76 y.o.   MRN: [id number]   Subjective:    Patient ID: Dawn Wolfe, female    DOB: [birth year], 76 y.o.   MRN: [id number]   Patient here for her physical exam.   Chief Complaint  Patient presents with   Follow-up    Yearly CPE   .   HPI Reports she is doing relatively well.  Stays active.  No chest pain or sob with increased activity or exertion.  Noticed slight racing heart last week.  One episode that did not last one minute. Has been doing well otherwise.  No cough or congestion.  No acid reflux.  No abdominal pain.  Bowels moving.  Has seen Dr Corky Sox.  Holter and ECHO ok.  Elected to follow.     Past Medical History:  Diagnosis Date   Anemia    h/o as a child   Arthritis    left shoulder   Cancer ([place]) 15 yrs ago   [employer] on the face. Surgical removal   Complication of anesthesia    Hemorrhoids    History of colonoscopy 2003   Normal, Dr. Sammuel Cooper, [place]    Hypercholesterolemia    Normal exercise sestamibi stress test 2008   Ken Fath   Obstructive sleep apnea on CPAP 2007   PONV (postoperative nausea and vomiting)    Rotator cuff tear, left    Sleep apnea    Past Surgical History:  Procedure Laterality Date   ABDOMINALPLASTY W/ LIPOSUCTION  AGE 23   BUNIONECTOMY  X3  LAST ONE 2003   CHOLECYSTECTOMY N/A 04/19/2015   Procedure: LAPAROSCOPIC CHOLECYSTECTOMY WITH INTRAOPERATIVE CHOLANGIOGRAM;  Surgeon: Robert Bellow, MD;  Location: [facility] ORS;  Service: General;  Laterality: N/A;   COLONOSCOPY  2012   Dr Wyline Mood in [place] N/A 09/11/2016   Procedure: COLONOSCOPY WITH PROPOFOL;  Surgeon: Lollie Sails, MD;  Location: [facility] ENDOSCOPY;  Service: Endoscopy;  Laterality: N/A;   ERCP N/A 04/29/2015   Procedure: ENDOSCOPIC RETROGRADE CHOLANGIOPANCREATOGRAPHY (ERCP);  Surgeon: Hulen Luster, MD;  Location: [facility] ENDOSCOPY;  Service: Gastroenterology;  Laterality: N/A;   EYE  SURGERY     cataract extraction   NASAL SEPTUM SURGERY  AGE 63   NASAL SINUS SURGERY  AGE 10   SHOULDER ARTHROSCOPY  10/25/2011   Procedure: ARTHROSCOPY SHOULDER;  Surgeon: Magnus Sinning, MD;  Location: [place];  Service: Orthopedics;  Laterality: Left;  with SAD, shave of the labrium    TUBAL LIGATION  1982   Family History  Problem Relation Age of Onset   Acute myelogenous leukemia Sister    Polycythemia Sister 3   Emphysema Father        smoker   Heart disease Father        myocardial infarction - 2   COPD Father    Heart attack Father    Breast cancer Paternal Aunt    Testicular cancer Brother    Heart disease Mother    Emphysema Mother    COPD Mother    Asthma Mother    Heart attack Mother    Leukemia Sister    Colon cancer Neg Hx    Social History   Socioeconomic History   Marital status: Married    Spouse name: Not on file   Number of children: Not on file   Years of education: Not on file   Highest education level:  Not on file  Occupational History   Not on file  Tobacco Use   Smoking status: Former    Packs/day: 2.50    Years: 10.00    Total pack years: 25.00    Types: Cigarettes    Quit date: 03/28/1977    Years since quitting: 44.4   Smokeless tobacco: Never  Substance and Sexual Activity   Alcohol use: No    Alcohol/week: 0.0 standard drinks of alcohol   Drug use: No   Sexual activity: Not Currently    Birth control/protection: Post-menopausal  Other Topics Concern   Not on file  Social History Narrative   Not on file   Social Determinants of Health   Financial Resource Strain: Low Risk  (05/06/2021)   Overall Financial Resource Strain (CARDIA)    Difficulty of Paying Living Expenses: Not hard at all  Food Insecurity: No Food Insecurity (05/06/2021)   Hunger Vital Sign    Worried About Running Out of Food in the Last Year: Never true    Ran Out of Food in the Last Year: Never true  Transportation Needs: No  Transportation Needs (05/06/2021)   PRAPARE - Hydrologist (Medical): No    Lack of Transportation (Non-Medical): No  Physical Activity: Sufficiently Active (05/06/2021)   Exercise Vital Sign    Days of Exercise per Week: 5 days    Minutes of Exercise per Session: 30 min  Stress: No Stress Concern Present (05/06/2021)   [place]    Feeling of Stress : Not at all  Social Connections: Unknown (05/06/2021)   Social Connection and Isolation Panel [NHANES]    Frequency of Communication with Friends and Family: Not on file    Frequency of Social Gatherings with Friends and Family: Not on file    Attends Religious Services: Not on file    Active Member of Clubs or Organizations: Not on file    Attends Archivist Meetings: Not on file    Marital Status: Married     Review of Systems  Constitutional:  Negative for appetite change and unexpected weight change.  HENT:  Negative for congestion, sinus pressure and sore throat.   Eyes:  Negative for pain and visual disturbance.  Respiratory:  Negative for cough, chest tightness and shortness of breath.   Cardiovascular:  Negative for chest pain, palpitations and leg swelling.  Gastrointestinal:  Negative for abdominal pain, diarrhea, nausea and vomiting.  Genitourinary:  Negative for difficulty urinating and dysuria.  Musculoskeletal:  Negative for joint swelling and myalgias.  Skin:  Negative for color change and rash.  Neurological:  Negative for dizziness, light-headedness and headaches.  Hematological:  Negative for adenopathy. Does not bruise/bleed easily.  Psychiatric/Behavioral:  Negative for agitation and dysphoric mood.        Objective:     BP 116/72 (BP Location: Left Arm, Patient Position: Sitting, Cuff Size: Small)   Pulse 82   Temp 98.2 F (36.8 C) (Temporal)   Resp 15   Ht 5' 6" (1.676 m)   Wt 132 lb 9.6 oz (60.1 kg)    SpO2 99%   BMI 21.40 kg/m  Wt Readings from Last 3 Encounters:  08/17/21 132 lb 9.6 oz (60.1 kg)  08/16/21 134 lb 12.8 oz (61.1 kg)  05/06/21 134 lb (60.8 kg)    Physical Exam Vitals reviewed.  Constitutional:      General: She is not in acute distress.  Appearance: Normal appearance. She is well-developed.  HENT:     Head: Normocephalic and atraumatic.     Right Ear: External ear normal.     Left Ear: External ear normal.  Eyes:     General: No scleral icterus.       Right eye: No discharge.        Left eye: No discharge.     Conjunctiva/sclera: Conjunctivae normal.  Neck:     Thyroid: No thyromegaly.  Cardiovascular:     Rate and Rhythm: Normal rate and regular rhythm.  Pulmonary:     Effort: No tachypnea, accessory muscle usage or respiratory distress.     Breath sounds: Normal breath sounds. No decreased breath sounds or wheezing.  Chest:  Breasts:    Right: No inverted nipple, mass, nipple discharge or tenderness (no axillary adenopathy).     Left: No inverted nipple, mass, nipple discharge or tenderness (no axilarry adenopathy).  Abdominal:     General: Bowel sounds are normal.     Palpations: Abdomen is soft.     Tenderness: There is no abdominal tenderness.  Musculoskeletal:        General: No swelling or tenderness.     Cervical back: Neck supple.  Lymphadenopathy:     Cervical: No cervical adenopathy.  Skin:    Findings: No erythema or rash.  Neurological:     Mental Status: She is alert and oriented to person, place, and time.  Psychiatric:        Mood and Affect: Mood normal.        Behavior: Behavior normal.      Outpatient Encounter Medications as of 08/17/2021  Medication Sig   aspirin EC 81 MG tablet Take 81 mg by mouth daily.   latanoprost (XALATAN) 0.005 % ophthalmic solution Place 1 drop into both eyes.   MAGNESIUM PO Take by mouth.   Multiple Vitamin (MULTIVITAMIN) capsule Take 1 capsule by mouth daily.   pravastatin (PRAVACHOL) 10 MG  tablet Take 1 tablet (10 mg total) by mouth daily.   [DISCONTINUED] pravastatin (PRAVACHOL) 10 MG tablet TAKE 1 TABLET BY MOUTH EVERY DAY   No facility-administered encounter medications on file as of 08/17/2021.     Lab Results  Component Value Date   WBC 4.9 03/22/2021   HGB 13.1 03/22/2021   HCT 38.5 03/22/2021   PLT 190.0 03/22/2021   GLUCOSE 100 (H) 08/12/2021   CHOL 133 08/12/2021   TRIG 103.0 08/12/2021   HDL 42.00 08/12/2021   LDLDIRECT 88.0 07/22/2014   LDLCALC 71 08/12/2021   ALT 15 08/12/2021   AST 22 08/12/2021   NA 141 08/12/2021   K 4.4 08/12/2021   CL 105 08/12/2021   CREATININE 0.88 08/12/2021   BUN 18 08/12/2021   CO2 29 08/12/2021   TSH 2.46 03/22/2021   HGBA1C 5.8 08/12/2021    MR ANGIO HEAD WO CONTRAST  Result Date: 11/06/2020 CLINICAL DATA:  [place]. EXAM: MRA HEAD WITHOUT CONTRAST TECHNIQUE: Angiographic images of the Circle of Willis were acquired using MRA technique without intravenous contrast. COMPARISON:  No pertinent prior exam. 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 No additional aneurysms are present. A much more shallow outpouching is present in a similar location on the left. The internal carotid arteries are otherwise within normal limits through the ICA termini. The A1 and M1 segments are normal. MCA bifurcations are intact. The A1 and M1  segments are normal. Posterior circulation: The vertebral arteries are codominant. Left PICA origin is visualized and normal. The right AICA is dominant. Vertebrobasilar junction is normal. The basilar artery is within normal limits. Both posterior cerebral arteries originate from the basilar tip. Anatomic variants: Dominant right AI Ca. Other: None. IMPRESSION: 1. 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 2. A much more shallow outpouching in a similar location on the left  without a discrete aneurysm. 3. Otherwise normal MRA circle-of-Willis. Electronically Signed   By: San Morelle M.D.   On: 11/06/2020 13:58       Assessment & Plan:   Problem List Items Addressed This Visit     Abnormal liver enzymes    Previous abdominal ultrasound - changes c/w fatty liver.  She has adjusted her diet.  Lost weight.  Follow liver function tests.        Relevant Orders   Hepatic function panel   Carotid artery aneurysm (HCC)    MRA as outlined in previous note.   Saw neurology.  Keep blood pressure under good control.  Reviewed with neurology - recommended f/u MRA in one year.  Due 10/2021.       Relevant Medications   pravastatin (PRAVACHOL) 10 MG tablet   Family history of colonic polyps    Had colonoscopy 08/2016 - sharp turn.  Subsequent ACBE.       Health care maintenance    Physical today 08/17/21.  Mammogram 09/03/20 - Birads I.  Colonoscopy 08/2016.       History of colon polyps    Colonoscopy 08/2016.  Per pt - due.  Refer back to GI.       Hypercholesteremia - Primary    On pravastatin.  Low cholesterol diet and exercise.  Follow lipid panel and liver function tests.   Lab Results  Component Value Date   CHOL 133 08/12/2021   HDL 42.00 08/12/2021   LDLCALC 71 08/12/2021   LDLDIRECT 88.0 07/22/2014   TRIG 103.0 08/12/2021   CHOLHDL 3 08/12/2021       Relevant Medications   pravastatin (PRAVACHOL) 10 MG tablet   Other Relevant Orders   Lipid Profile   Hyperglycemia    Continues to watch diet.  Follow met b and a1c.   Lab Results  Component Value Date   HGBA1C 5.8 08/12/2021       Relevant Orders   Basic Metabolic Panel (BMET)   HgB A1c   Increased heart rate    Had the episode recently of increased heart rate.  Previously saw cardiology as outlined.  W/up as outlined.  Follow.       Relevant Orders   EKG 12-Lead (Completed)   Obstructive sleep apnea    Saw pulmonary 08/16/21. She was questioning if still needed cpap.   Recommended HST to determine. Continue cpap.  Discussed with her regarding sleep apnea and treatment.         Einar Pheasant, MD

## 2021-08-18 NOTE — Progress Notes (Signed)
Reviewed and agree with assessment/plan.   Chesley Mires, MD [facility] Pulmonary/Critical Care 08/18/2021, 9:59 AM Pager:  [phone]

## 2021-08-19 DIAGNOSIS — G4733 Obstructive sleep apnea (adult) (pediatric): Secondary | ICD-10-CM | POA: Diagnosis not present

## 2021-08-22 ENCOUNTER — Telehealth: Payer: Self-pay | Admitting: Internal Medicine

## 2021-08-22 ENCOUNTER — Encounter: Payer: Self-pay | Admitting: Internal Medicine

## 2021-08-22 ENCOUNTER — Other Ambulatory Visit: Payer: Self-pay

## 2021-08-22 DIAGNOSIS — Z1211 Encounter for screening for malignant neoplasm of colon: Secondary | ICD-10-CM

## 2021-08-22 DIAGNOSIS — Z136 Encounter for screening for cardiovascular disorders: Secondary | ICD-10-CM

## 2021-08-22 DIAGNOSIS — I671 Cerebral aneurysm, nonruptured: Secondary | ICD-10-CM

## 2021-08-22 NOTE — Assessment & Plan Note (Signed)
Colonoscopy 08/2016.  Per pt - due.  Refer back to GI.

## 2021-08-22 NOTE — Telephone Encounter (Signed)
I had told Dawn Wolfe I would f/u with her. Notify - reviewed recommendation regarding aneurysm.  Neurology did recommend a f/u MRA in one year. She will be due 10/2021.  If agreeable, I can order.  Also, per note, due f/u colonoscopy. Previously saw Dr Gustavo Lah.  He has retired.  Does she have  preference with which MD she prefers to see.  Will refer back for evaluation.

## 2021-08-22 NOTE — Assessment & Plan Note (Signed)
On pravastatin.  Low cholesterol diet and exercise.  Follow lipid panel and liver function tests.   Lab Results  Component Value Date   CHOL 133 08/12/2021   HDL 42.00 08/12/2021   LDLCALC 71 08/12/2021   LDLDIRECT 88.0 07/22/2014   TRIG 103.0 08/12/2021   CHOLHDL 3 08/12/2021

## 2021-08-22 NOTE — Assessment & Plan Note (Signed)
Previous abdominal ultrasound - changes c/w fatty liver.  She has adjusted her diet.  Lost weight.  Follow liver function tests.

## 2021-08-22 NOTE — Assessment & Plan Note (Signed)
Continues to watch diet.  Follow met b and a1c.   Lab Results  Component Value Date   HGBA1C 5.8 08/12/2021

## 2021-08-22 NOTE — Assessment & Plan Note (Signed)
Saw pulmonary 08/16/21. She was questioning if still needed cpap.  Recommended HST to determine. Continue cpap.  Discussed with her regarding sleep apnea and treatment.

## 2021-08-22 NOTE — Assessment & Plan Note (Addendum)
MRA as outlined in previous note.   Saw neurology.  Keep blood pressure under good control.  Reviewed with neurology - recommended f/u MRA in one year.  Due 10/2021.

## 2021-08-22 NOTE — Assessment & Plan Note (Signed)
Had the episode recently of increased heart rate.  Previously saw cardiology as outlined.  W/up as outlined.  Follow.

## 2021-08-22 NOTE — Assessment & Plan Note (Signed)
Had colonoscopy 08/2016 - sharp turn.  Subsequent ACBE.

## 2021-08-22 NOTE — Telephone Encounter (Signed)
I called patient and she was agreeable to have MRA ordered. She knows that she will be contacted with appointment date & time. I will also refer back to [facility] since she did not have a provider preference.

## 2021-08-23 NOTE — Telephone Encounter (Signed)
Will order MRA.  Does she need scheduled in [place] or can she do here at [facility]

## 2021-08-23 NOTE — Telephone Encounter (Signed)
S/w pt - needs open MRA - please order in [place]

## 2021-08-23 NOTE — Telephone Encounter (Signed)
Pt advised.

## 2021-08-23 NOTE — Telephone Encounter (Signed)
Order placed for MRA.  See other phone message.

## 2021-08-23 NOTE — Telephone Encounter (Signed)
The orders for both scans have been placed.  I am not sure they can do together because one is MRI and the other is CT scan.

## 2021-08-23 NOTE — Addendum Note (Signed)
Addended by: Alisa Graff on: 08/23/2021 01:00 PM   Modules accepted: Orders

## 2021-09-02 ENCOUNTER — Ambulatory Visit
Admission: RE | Admit: 2021-09-02 | Discharge: 2021-09-02 | Disposition: A | Discharge status: Home / Self Care | Payer: Medicare HMO | Source: Ambulatory Visit | Attending: Internal Medicine | Admitting: Internal Medicine

## 2021-09-02 DIAGNOSIS — Z1231 Encounter for screening mammogram for malignant neoplasm of breast: Secondary | ICD-10-CM | POA: Diagnosis not present

## 2021-09-04 ENCOUNTER — Ambulatory Visit (HOSPITAL_COMMUNITY)
Admission: RE | Admit: 2021-09-04 | Discharge: 2021-09-04 | Disposition: A | Discharge status: Home / Self Care | Payer: Medicare HMO | Source: Ambulatory Visit | Attending: Internal Medicine | Admitting: Internal Medicine

## 2021-09-04 DIAGNOSIS — I671 Cerebral aneurysm, nonruptured: Secondary | ICD-10-CM | POA: Insufficient documentation

## 2021-09-06 ENCOUNTER — Encounter: Payer: Self-pay | Admitting: Internal Medicine

## 2021-09-06 ENCOUNTER — Ambulatory Visit
Admission: RE | Admit: 2021-09-06 | Discharge: 2021-09-06 | Disposition: A | Discharge status: Home / Self Care | Payer: Medicare HMO | Source: Ambulatory Visit | Attending: Internal Medicine | Admitting: Internal Medicine

## 2021-09-06 DIAGNOSIS — Z136 Encounter for screening for cardiovascular disorders: Secondary | ICD-10-CM | POA: Insufficient documentation

## 2021-09-07 NOTE — Telephone Encounter (Signed)
See addended note.  Radiology confirm was 2.28m.

## 2021-09-15 ENCOUNTER — Other Ambulatory Visit: Payer: Self-pay | Admitting: Internal Medicine

## 2021-09-15 DIAGNOSIS — R911 Solitary pulmonary nodule: Secondary | ICD-10-CM

## 2021-09-15 DIAGNOSIS — R9389 Abnormal findings on diagnostic imaging of other specified body structures: Secondary | ICD-10-CM

## 2021-09-15 NOTE — Progress Notes (Signed)
Order placed for pulmonary referral.  

## 2021-09-26 DIAGNOSIS — Z809 Family history of malignant neoplasm, unspecified: Secondary | ICD-10-CM | POA: Diagnosis not present

## 2021-09-26 DIAGNOSIS — G4733 Obstructive sleep apnea (adult) (pediatric): Secondary | ICD-10-CM | POA: Diagnosis not present

## 2021-09-26 DIAGNOSIS — Z885 Allergy status to narcotic agent status: Secondary | ICD-10-CM | POA: Diagnosis not present

## 2021-09-26 DIAGNOSIS — E785 Hyperlipidemia, unspecified: Secondary | ICD-10-CM | POA: Diagnosis not present

## 2021-09-26 DIAGNOSIS — Z8673 Personal history of transient ischemic attack (TIA), and cerebral infarction without residual deficits: Secondary | ICD-10-CM | POA: Diagnosis not present

## 2021-09-26 DIAGNOSIS — H40159 Residual stage of open-angle glaucoma, unspecified eye: Secondary | ICD-10-CM | POA: Diagnosis not present

## 2021-09-26 DIAGNOSIS — Z87891 Personal history of nicotine dependence: Secondary | ICD-10-CM | POA: Diagnosis not present

## 2021-09-26 DIAGNOSIS — Z8249 Family history of ischemic heart disease and other diseases of the circulatory system: Secondary | ICD-10-CM | POA: Diagnosis not present

## 2021-09-28 ENCOUNTER — Ambulatory Visit: Payer: Medicare HMO | Admitting: Student in an Organized Health Care Education/Training Program

## 2021-09-28 ENCOUNTER — Encounter: Payer: Self-pay | Admitting: Student in an Organized Health Care Education/Training Program

## 2021-09-28 VITALS — BP 122/70 | HR 94 | Temp 97.8°F | Ht 66.0 in | Wt 134.2 lb

## 2021-09-28 DIAGNOSIS — R911 Solitary pulmonary nodule: Secondary | ICD-10-CM | POA: Diagnosis not present

## 2021-09-28 NOTE — Progress Notes (Signed)
Synopsis: Referred in for pulmonary nodule by Einar Pheasant, MD  Assessment & Plan:   1. Pulmonary nodule 1 cm or greater in diameter  Presenting with a 10 mm LLL solid nodule and similarly a ground glass nodule in the RLL. The nodule is not spiculated, with no associated lymphadenopathy (on limited images from the coronary CT) and she is a non-smoker. The LLL nodule exhibits 13.8% risk of being malignant based on the [facility] SPN risk score calculator. She is ECOG 0.  Given the scan was a partial one, I will obtain a dedicated Chest CT without contrast to evaluate the parenchyma, assess for other nodules/masses, and assess for lymphadenopathy. The nodule, however, does exhibit intermediate risk of being malignant and I believe the patient would also benefit from a PET/CT for further risk stratification.  Today, we also discussed the importance of diagnosis in lung malignancies, and discussed the differential for her nodule with benign possibilities as well as the possibility of malignancy. We discussed the approach to obtaining a diagnosis starting with dedicated imaging, a PET/CT, followed by possible tissue diagnosis which would include robotic assisted navigational bronchoscopy with endobronchial ultrasound guided sampling.    The patient will obtain the chest CT and PET/CT and will follow up with me in 2 weeks.  - CT CHEST WO CONTRAST; Future - NM PET Image Initial (PI) Skull Base To Thigh (F-18 FDG); Future   Return in about 2 weeks (around 10/12/2021).  I spent 60 minutes caring for this patient today, including preparing to see the patient, obtaining and/or reviewing separately obtained history, performing a medically appropriate examination and/or evaluation, counseling and educating the patient/family/caregiver, ordering medications, tests, or procedures, and documenting clinical information in the electronic health record  Armando Reichert, MD [facility] 09/28/2021 10:27 AM    End of visit medications:  No orders of the defined types were placed in this encounter.    Current Outpatient Medications:    aspirin EC 81 MG tablet, Take 81 mg by mouth daily., Disp: , Rfl:    latanoprost (XALATAN) 0.005 % ophthalmic solution, Place 1 drop into both eyes., Disp: , Rfl: 3   MAGNESIUM PO, Take by mouth., Disp: , Rfl:    Multiple Vitamin (MULTIVITAMIN) capsule, Take 1 capsule by mouth daily., Disp: , Rfl:    pravastatin (PRAVACHOL) 10 MG tablet, Take 1 tablet (10 mg total) by mouth daily., Disp: 90 tablet, Rfl: 3   Subjective:   PATIENT ID: Dawn Wolfe GENDER: female DOB: 1945/08/30, MRN: [id number]  Chief Complaint  Patient presents with   pulmonary consult    CT 09/06/2021-no current sx.     HPI  Mrs. Lata is a pleasant 76 year old female presenting for the evaluation of an incidental pulmonary nodule.  She reports that she is feeling well and and has no symptoms.  She has no shortness of breath, cough, chest pain, wheezing, chest tightness, or other constitutional symptoms.  She has not had any unintentional weight loss and denies any night sweats.  She tells me that a friend of hers recommended that she get a coronary calcium screen which she obtained recently (09/06/2021).  The scan was noted to have a solid pulmonary nodule in the left lower lobe and a groundglass nodule in the right lower lobe.  She is presenting today to clinic to further discuss this.  She does have a history of sleep apnea and sees Rexene Edison, NP for it.  She reports compliance  with her CPAP and that it has made her sleep and energy levels a lot better.  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  She is a non-smoker and does not drink.  Ancillary information including prior medications, full medical/surgical/family/social histories, and PFTs (when available) are listed below and have been reviewed.   Review of Systems   Constitutional:  Negative for chills, fever and weight loss (no unintentional weight loss).  Respiratory:  Negative for cough, hemoptysis, sputum production, shortness of breath and wheezing.   Cardiovascular:  Negative for chest pain and orthopnea.  Gastrointestinal:  Negative for heartburn.  Skin:  Negative for rash.     Objective:   Vitals:   09/28/21 0933  BP: 122/70  Pulse: 94  Temp: 97.8 F (36.6 C)  TempSrc: Temporal  SpO2: 98%  Weight: 134 lb 3.2 oz (60.9 kg)  Height: '5\' 6"'$  (1.676 m)   98% on RA BMI Readings from Last 3 Encounters:  09/28/21 21.66 kg/m  08/17/21 21.40 kg/m  08/16/21 21.76 kg/m   Wt Readings from Last 3 Encounters:  09/28/21 134 lb 3.2 oz (60.9 kg)  08/17/21 132 lb 9.6 oz (60.1 kg)  08/16/21 134 lb 12.8 oz (61.1 kg)    Physical Exam Constitutional:      Appearance: Normal appearance.  HENT:     Head: Normocephalic.     Nose: Nose normal.     Mouth/Throat:     Mouth: Mucous membranes are moist.  Eyes:     Pupils: Pupils are equal, round, and reactive to light.  Cardiovascular:     Rate and Rhythm: Normal rate and regular rhythm.     Pulses: Normal pulses.     Heart sounds: Normal heart sounds.  Pulmonary:     Effort: Pulmonary effort is normal.     Breath sounds: Normal breath sounds.  Abdominal:     General: Abdomen is flat.     Palpations: Abdomen is soft.  Musculoskeletal:        General: Normal range of motion.     Cervical back: Normal range of motion.  Skin:    General: Skin is warm.  Neurological:     General: No focal deficit present.     Mental Status: She is alert and oriented to person, place, and time.       Ancillary Information    Past Medical History:  Diagnosis Date   Anemia    h/o as a child   Arthritis    left shoulder   Cancer ([place]) 15 yrs ago   [employer] on the face. Surgical removal   Complication of anesthesia    Hemorrhoids    History of colonoscopy 2003   Normal, Dr. Sammuel Cooper, [place]     Hypercholesterolemia    Normal exercise sestamibi stress test 2008   Ken Fath   Obstructive sleep apnea on CPAP 2007   PONV (postoperative nausea and vomiting)    Rotator cuff tear, left    Sleep apnea      Family History  Problem Relation Age of Onset   Acute myelogenous leukemia Sister    Polycythemia Sister 75   Emphysema Father        smoker   Heart disease Father        myocardial infarction - 77   COPD Father    Heart attack Father    Breast cancer Paternal Aunt    Testicular cancer Brother    Heart disease Mother  Emphysema Mother    COPD Mother    Asthma Mother    Heart attack Mother    Leukemia Sister    Colon cancer Neg Hx      Past Surgical History:  Procedure Laterality Date   ABDOMINALPLASTY W/ LIPOSUCTION  AGE 10   BUNIONECTOMY  X3  LAST ONE 2003   CHOLECYSTECTOMY N/A 04/19/2015   Procedure: LAPAROSCOPIC CHOLECYSTECTOMY WITH INTRAOPERATIVE CHOLANGIOGRAM;  Surgeon: Robert Bellow, MD;  Location: [facility] ORS;  Service: General;  Laterality: N/A;   COLONOSCOPY  2012   Dr Wyline Mood in [place] N/A 09/11/2016   Procedure: COLONOSCOPY WITH PROPOFOL;  Surgeon: Lollie Sails, MD;  Location: [facility] ENDOSCOPY;  Service: Endoscopy;  Laterality: N/A;   ERCP N/A 04/29/2015   Procedure: ENDOSCOPIC RETROGRADE CHOLANGIOPANCREATOGRAPHY (ERCP);  Surgeon: Hulen Luster, MD;  Location[facility] END[facility]COPY;  Service: Gastroenterology;  Laterality: N/A;   EYE SURGERY     cataract extraction   NASAL SEPTUM SURGERY  AGE 67   NASAL SINUS SURGERY  AGE 36   SHOULDER ARTHROSCOPY  10/25/2011   Procedure: ARTHROSCOPY SHOULDER;  Surgeon: Magnus Sinning, MD;  Location: [place];  Service: Orthopedics;  Laterality: Left;  with SAD, shave of the labrium    TUBAL LIGATION  1982    Social History   Socioeconomic History   Marital status: Married    Spouse name: Not on file   Number of children: Not on file   Years of education: Not on  file   Highest education level: Not on file  Occupational History   Not on file  Tobacco Use   Smoking status: Former    Packs/day: 2.50    Years: 10.00    Total pack years: 25.00    Types: Cigarettes    Quit date: 03/28/1977    Years since quitting: 44.5   Smokeless tobacco: Never  Substance and Sexual Activity   Alcohol use: No    Alcohol/week: 0.0 standard drinks of alcohol   Drug use: No   Sexual activity: Not Currently    Birth control/protection: Post-menopausal  Other Topics Concern   Not on file  Social History Narrative   Not on file   Social Determinants of Health   Financial Resource Strain: Low Risk  (05/06/2021)   Overall Financial Resource Strain (CARDIA)    Difficulty of Paying Living Expenses: Not hard at all  Food Insecurity: No Food Insecurity (05/06/2021)   Hunger Vital Sign    Worried About Running Out of Food in the Last Year: Never true    Ran Out of Food in the Last Year: Never true  Transportation Needs: No Transportation Needs (05/06/2021)   PRAPARE - Hydrologist (Medical): No    Lack of Transportation (Non-Medical): No  Physical Activity: Sufficiently Active (05/06/2021)   Exercise Vital Sign    Days of Exercise per Week: 5 days    Minutes of Exercise per Session: 30 min  Stress: No Stress Concern Present (05/06/2021)   [address]    Feeling of Stress : Not at all  Social Connections: Unknown (05/06/2021)   Social Connection and Isolation Panel [NHANES]    Frequency of Communication with Friends and Family: Not on file    Frequency of Social Gatherings with Friends and Family: Not on file    Attends Religious Services: Not on file    Active Member of Clubs  or Organizations: Not on file    Attends Club or Organization Meetings: Not on file    Marital Status: Married  Intimate Partner Violence: Not At Risk (05/06/2021)   Humiliation, Afraid, Rape, and  Kick questionnaire    Fear of Current or Ex-Partner: No    Emotionally Abused: No    Physically Abused: No    Sexually Abused: No     Allergies  Allergen Reactions   Codeine Nausea And Vomiting     CBC    Component Value Date/Time   WBC 4.9 03/22/2021 1017   RBC 4.39 03/22/2021 1017   HGB 13.1 03/22/2021 1017   HCT 38.5 03/22/2021 1017   PLT 190.0 03/22/2021 1017   MCV 87.7 03/22/2021 1017   MCHC 34.1 03/22/2021 1017   RDW 13.7 03/22/2021 1017   LYMPHSABS 1.6 03/22/2021 1017   MONOABS 0.4 03/22/2021 1017   EOSABS 0.2 03/22/2021 1017   BASOSABS 0.0 03/22/2021 1017    Pulmonary Functions Testing Results:     No data to display          Outpatient Medications Prior to Visit  Medication Sig Dispense Refill   aspirin EC 81 MG tablet Take 81 mg by mouth daily.     latanoprost (XALATAN) 0.005 % ophthalmic solution Place 1 drop into both eyes.  3   MAGNESIUM PO Take by mouth.     Multiple Vitamin (MULTIVITAMIN) capsule Take 1 capsule by mouth daily.     pravastatin (PRAVACHOL) 10 MG tablet Take 1 tablet (10 mg total) by mouth daily. 90 tablet 3   No facility-administered medications prior to visit.

## 2021-09-28 NOTE — Patient Instructions (Signed)
It was nice meeting you today. Today, we discussed the nodules in your lung. I will start the workup with a Chest CT and follow that up with a PET/CT. I will see you in 2 weeks to discuss the next steps in diagnosis and treatment, which could include a biopsy.

## 2021-10-04 ENCOUNTER — Telehealth: Payer: Medicare HMO | Admitting: Adult Health

## 2021-10-04 DIAGNOSIS — H40153 Residual stage of open-angle glaucoma, bilateral: Secondary | ICD-10-CM | POA: Diagnosis not present

## 2021-10-13 ENCOUNTER — Ambulatory Visit
Admission: RE | Admit: 2021-10-13 | Discharge: 2021-10-13 | Disposition: A | Discharge status: Home / Self Care | Payer: Medicare HMO | Source: Ambulatory Visit | Attending: Student in an Organized Health Care Education/Training Program | Admitting: Student in an Organized Health Care Education/Training Program

## 2021-10-13 DIAGNOSIS — E119 Type 2 diabetes mellitus without complications: Secondary | ICD-10-CM | POA: Insufficient documentation

## 2021-10-13 DIAGNOSIS — R911 Solitary pulmonary nodule: Secondary | ICD-10-CM

## 2021-10-13 DIAGNOSIS — R918 Other nonspecific abnormal finding of lung field: Secondary | ICD-10-CM | POA: Diagnosis not present

## 2021-10-13 LAB — GLUCOSE, CAPILLARY: Glucose-Capillary: 104 mg/dL — ABNORMAL HIGH (ref 70–99)

## 2021-10-13 MED ORDER — FLUDEOXYGLUCOSE F - 18 (FDG) INJECTION
7.4700 | Freq: Once | INTRAVENOUS | Status: AC | PRN
  Administered 2021-10-13: 7.38 via INTRAVENOUS

## 2021-10-17 ENCOUNTER — Ambulatory Visit: Payer: Medicare HMO | Admitting: Student in an Organized Health Care Education/Training Program

## 2021-10-17 ENCOUNTER — Encounter: Payer: Self-pay | Admitting: Student in an Organized Health Care Education/Training Program

## 2021-10-17 VITALS — BP 120/62 | HR 93 | Temp 97.7°F | Ht 66.0 in | Wt 133.6 lb

## 2021-10-17 DIAGNOSIS — R911 Solitary pulmonary nodule: Secondary | ICD-10-CM | POA: Diagnosis not present

## 2021-10-17 NOTE — Progress Notes (Signed)
Synopsis: Referred in for pulmonary nodule by Einar Pheasant, MD  Assessment & Plan:   1. Pulmonary nodule 1 cm or greater in diameter  Presenting with a 10 mm LLL solid nodule and similarly a ground glass nodule in the RLL. The nodule is not spiculated, with no associated lymphadenopathy and is not PET avid on CT. The LLL nodule exhibits 1.4% risk of being malignant based on the [facility] SPN risk score calculator. She is ECOG 0.   Today, we also discussed the importance of diagnosis in lung malignancies, and discussed the differential for the nodule with benign possibilities as well as the possibility of malignancy. We discussed the approach to obtaining a diagnosis which would include robotic assisted navigational bronchoscopy vs. watchful waiting with serial imaging. Patient and husband inquired about the risk of malignancy [facility] SPN of 1.4%) vs. Risk of procedural complication from a navigational bronchoscopy (<2%). Explained that while the risk is low, it does not preclude the possibility of an indolent malignancy. Patient would prefer a watchful waiting approach with serial imaging to the LLL nodule. I will order a repeat CT of the chest in 3 months for surveillance. Furthermore, will obtain an MRI of the thoracic spine given the increase in FDG avidity on the PET.  - MR THORACIC SPINE WO CONTRAST; Future - CT CHEST WO CONTRAST; Future   Return in about 3 months (around 01/17/2022).  I spent 30 minutes caring for this patient today, including preparing to see the patient, obtaining and/or reviewing separately obtained history, performing a medically appropriate examination and/or evaluation, counseling and educating the patient/family/caregiver, ordering medications, tests, or procedures, and documenting clinical information in the electronic health record  Armando Reichert, MD [facility] 10/17/2021 3:18 PM    End of visit medications:  No orders of the  defined types were placed in this encounter.    Current Outpatient Medications:    aspirin EC 81 MG tablet, Take 81 mg by mouth daily., Disp: , Rfl:    latanoprost (XALATAN) 0.005 % ophthalmic solution, Place 1 drop into both eyes., Disp: , Rfl: 3   MAGNESIUM PO, Take by mouth., Disp: , Rfl:    Multiple Vitamin (MULTIVITAMIN) capsule, Take 1 capsule by mouth daily., Disp: , Rfl:    pravastatin (PRAVACHOL) 10 MG tablet, Take 1 tablet (10 mg total) by mouth daily., Disp: 90 tablet, Rfl: 3   Subjective:   PATIENT ID: Dawn Wolfe GENDER: female DOB: Jan 09, 1946, MRN: [id number]  Chief Complaint  Patient presents with   Follow-up    PET and CT 10/13/2021.     HPI  Dawn Wolfe is a pleasant 76 year old female presenting for follow up. I saw her on 09/28/2021 for the evaluation of an incidental pulmonary nodule and ordered a CT of the chest as well as a PET/CT. She is here to discuss the results.  Overall, she has not had any change in symptoms. She feels well. She has no shortness of breath, cough, chest pain, wheezing, chest tightness, or other constitutional symptoms.  She has not had any unintentional weight loss and denies any night sweats.   She tells me that a friend of hers recommended that she get a coronary calcium screen which she obtained on 09/06/2021.  The scan was noted to have a solid pulmonary nodule in the left lower lobe and a groundglass nodule in the right lower lobe. We had ordered a dedicated CT of the chest and a PET/CT during  our last visit and she is here to discuss this. She does have a history of sleep apnea and sees Rexene Edison, NP for it.  She reports compliance with her CPAP and that it has made her sleep and energy levels a lot better.   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  She is a non-smoker and does not drink.  Ancillary information including prior medications, full medical/surgical/family/social histories, and PFTs  (when available) are listed below and have been reviewed.   Review of Systems  Constitutional:  Negative for chills, fever and weight loss (no unintentional weight loss).  Respiratory:  Negative for cough, hemoptysis, sputum production, shortness of breath and wheezing.   Cardiovascular:  Negative for chest pain and orthopnea.  Gastrointestinal:  Negative for heartburn.  Skin:  Negative for rash.     Objective:   Vitals:   10/17/21 0918  BP: 120/62  Pulse: 93  Temp: 97.7 F (36.5 C)  TempSrc: Temporal  SpO2: 98%  Weight: 133 lb 9.6 oz (60.6 kg)  Height: '5\' 6"'$  (1.676 m)   98% on RA  BMI Readings from Last 3 Encounters:  10/17/21 21.56 kg/m  09/28/21 21.66 kg/m  08/17/21 21.40 kg/m   Wt Readings from Last 3 Encounters:  10/17/21 133 lb 9.6 oz (60.6 kg)  09/28/21 134 lb 3.2 oz (60.9 kg)  08/17/21 132 lb 9.6 oz (60.1 kg)    Physical Exam Constitutional:      Appearance: Normal appearance.  HENT:     Head: Normocephalic.     Mouth/Throat:     Mouth: Mucous membranes are moist.  Cardiovascular:     Rate and Rhythm: Normal rate and regular rhythm.     Heart sounds: Normal heart sounds.  Pulmonary:     Effort: Pulmonary effort is normal.     Breath sounds: Normal breath sounds.  Musculoskeletal:        General: Normal range of motion.     Cervical back: Normal range of motion.  Skin:    General: Skin is warm.  Neurological:     General: No focal deficit present.     Mental Status: She is alert and oriented to person, place, and time.       Ancillary Information    Past Medical History:  Diagnosis Date   Anemia    h/o as a child   Arthritis    left shoulder   Cancer ([place]) 15 yrs ago   [employer] on the face. Surgical removal   Complication of anesthesia    Hemorrhoids    History of colonoscopy 2003   Normal, Dr. Sammuel Cooper, [place]    Hypercholesterolemia    Normal exercise sestamibi stress test 2008   Ken Fath   Obstructive sleep apnea on CPAP  2007   PONV (postoperative nausea and vomiting)    Rotator cuff tear, left    Sleep apnea      Family History  Problem Relation Age of Onset   Acute myelogenous leukemia Sister    Polycythemia Sister 41   Emphysema Father        smoker   Heart disease Father        myocardial infarction - 25   COPD Father    Heart attack Father    Breast cancer Paternal Aunt    Testicular cancer Brother    Heart disease Mother    Emphysema Mother    COPD Mother    Asthma Mother  Heart attack Mother    Leukemia Sister    Colon cancer Neg Hx      Past Surgical History:  Procedure Laterality Date   ABDOMINALPLASTY W/ LIPOSUCTION  AGE 32   BUNIONECTOMY  X3  LAST ONE 2003   CHOLECYSTECTOMY N/A 04/19/2015   Procedure: LAPAROSCOPIC CHOLECYSTECTOMY WITH INTRAOPERATIVE CHOLANGIOGRAM;  Surgeon: Robert Bellow, MD;  Location: [facility] ORS;  Service: General;  Laterality: N/A;   COLONOSCOPY  2012   Dr Wyline Mood in [place] N/A 09/11/2016   Procedure: COLONOSCOPY WITH PROPOFOL;  Surgeon: Lollie Sails, MD;  Location: [facility] ENDOSCOPY;  Service: Endoscopy;  Laterality: N/A;   ERCP N/A 04/29/2015   Procedure: ENDOSCOPIC RETROGRADE CHOLANGIOPANCREATOGRAPHY (ERCP);  Surgeon: Hulen Luster, MD;  Location: [facility] ENDOSCOPY;  Service: Gastroenterology;  Laterality: N/A;   EYE SURGERY     cataract extraction   NASAL SEPTUM SURGERY  AGE 54   NASAL SINUS SURGERY  AGE 51   SHOULDER ARTHROSCOPY  10/25/2011   Procedure: ARTHROSCOPY SHOULDER;  Surgeon: Magnus Sinning, MD;  Location: [place];  Service: Orthopedics;  Laterality: Left;  with SAD, shave of the labrium    TUBAL LIGATION  1982    Social History   Socioeconomic History   Marital status: Married    Spouse name: Not on file   Number of children: Not on file   Years of education: Not on file   Highest education level: Not on file  Occupational History   Not on file  Tobacco Use   Smoking status:  Former    Packs/day: 2.50    Years: 10.00    Total pack years: 25.00    Types: Cigarettes    Quit date: 03/28/1977    Years since quitting: 44.5   Smokeless tobacco: Never  Substance and Sexual Activity   Alcohol use: No    Alcohol/week: 0.0 standard drinks of alcohol   Drug use: No   Sexual activity: Not Currently    Birth control/protection: Post-menopausal  Other Topics Concern   Not on file  Social History Narrative   Not on file   Social Determinants of Health   Financial Resource Strain: Low Risk  (05/06/2021)   Overall Financial Resource Strain (CARDIA)    Difficulty of Paying Living Expenses: Not hard at all  Food Insecurity: No Food Insecurity (05/06/2021)   Hunger Vital Sign    Worried About Running Out of Food in the Last Year: Never true    Ran Out of Food in the Last Year: Never true  Transportation Needs: No Transportation Needs (05/06/2021)   PRAPARE - Hydrologist (Medical): No    Lack of Transportation (Non-Medical): No  Physical Activity: Sufficiently Active (05/06/2021)   Exercise Vital Sign    Days of Exercise per Week: 5 days    Minutes of Exercise per Session: 30 min  Stress: No Stress Concern Present (05/06/2021)   [place]    Feeling of Stress : Not at all  Social Connections: Unknown (05/06/2021)   Social Connection and Isolation Panel [NHANES]    Frequency of Communication with Friends and Family: Not on file    Frequency of Social Gatherings with Friends and Family: Not on file    Attends Religious Services: Not on file    Active Member of Clubs or Organizations: Not on file    Attends Archivist Meetings: Not on  file    Marital Status: Married  Human resources officer Violence: Not At Risk (05/06/2021)   Humiliation, Afraid, Rape, and Kick questionnaire    Fear of Current or Ex-Partner: No    Emotionally Abused: No    Physically Abused: No     Sexually Abused: No     Allergies  Allergen Reactions   Codeine Nausea And Vomiting     CBC    Component Value Date/Time   WBC 4.9 03/22/2021 1017   RBC 4.39 03/22/2021 1017   HGB 13.1 03/22/2021 1017   HCT 38.5 03/22/2021 1017   PLT 190.0 03/22/2021 1017   MCV 87.7 03/22/2021 1017   MCHC 34.1 03/22/2021 1017   RDW 13.7 03/22/2021 1017   LYMPHSABS 1.6 03/22/2021 1017   MONOABS 0.4 03/22/2021 1017   EOSABS 0.2 03/22/2021 1017   BASOSABS 0.0 03/22/2021 1017    Pulmonary Functions Testing Results:     No data to display          Outpatient Medications Prior to Visit  Medication Sig Dispense Refill   aspirin EC 81 MG tablet Take 81 mg by mouth daily.     latanoprost (XALATAN) 0.005 % ophthalmic solution Place 1 drop into both eyes.  3   MAGNESIUM PO Take by mouth.     Multiple Vitamin (MULTIVITAMIN) capsule Take 1 capsule by mouth daily.     pravastatin (PRAVACHOL) 10 MG tablet Take 1 tablet (10 mg total) by mouth daily. 90 tablet 3   No facility-administered medications prior to visit.

## 2021-10-18 ENCOUNTER — Encounter: Payer: Self-pay | Admitting: Internal Medicine

## 2021-10-18 DIAGNOSIS — R911 Solitary pulmonary nodule: Secondary | ICD-10-CM | POA: Insufficient documentation

## 2021-10-19 ENCOUNTER — Telehealth: Payer: Self-pay | Admitting: Student in an Organized Health Care Education/Training Program

## 2021-10-19 DIAGNOSIS — R911 Solitary pulmonary nodule: Secondary | ICD-10-CM

## 2021-10-19 NOTE — Telephone Encounter (Signed)
Lm x1 for patient.  

## 2021-10-20 MED ORDER — LORAZEPAM 0.5 MG PO TABS: 0.5000 mg | ORAL_TABLET | Freq: Once | ORAL | 0 refills | Status: AC

## 2021-10-20 NOTE — Telephone Encounter (Signed)
Lm for patient.  

## 2021-10-20 NOTE — Telephone Encounter (Signed)
Sent the script to [facility]. Please call the patient and let her know to take it 1 hour prior to the procedure. Thank you

## 2021-10-20 NOTE — Telephone Encounter (Signed)
Reviewed PDMP of [place], patient not on any narcotics or sedatives. Ordered a one time dose of lorazepam 0.5 mg orally once to be taken 1 hours prior to the proceedure

## 2021-10-20 NOTE — Telephone Encounter (Signed)
Called and spoke to patient. She stated that she is scheduled for MRI on Sunday and she would like a sedative because she has claustrophobia.   Dr. Genia Harold, please advise. Thanks

## 2021-10-21 NOTE — Telephone Encounter (Signed)
Patient is aware of below message and voiced her understanding.  Nothing further needed.   

## 2021-10-23 ENCOUNTER — Ambulatory Visit (HOSPITAL_COMMUNITY)
Admission: RE | Admit: 2021-10-23 | Discharge: 2021-10-23 | Disposition: A | Discharge status: Home / Self Care | Payer: Medicare HMO | Source: Ambulatory Visit | Attending: Student in an Organized Health Care Education/Training Program | Admitting: Student in an Organized Health Care Education/Training Program

## 2021-10-23 DIAGNOSIS — R911 Solitary pulmonary nodule: Secondary | ICD-10-CM

## 2021-11-16 ENCOUNTER — Ambulatory Visit
Admission: RE | Admit: 2021-11-16 | Discharge: 2021-11-16 | Disposition: A | Discharge status: Home / Self Care | Payer: Medicare HMO | Source: Ambulatory Visit | Attending: Student in an Organized Health Care Education/Training Program | Admitting: Student in an Organized Health Care Education/Training Program

## 2021-11-16 ENCOUNTER — Telehealth: Payer: Self-pay | Admitting: Student in an Organized Health Care Education/Training Program

## 2021-11-16 DIAGNOSIS — R911 Solitary pulmonary nodule: Secondary | ICD-10-CM

## 2021-11-16 NOTE — Telephone Encounter (Signed)
Pt called the office stating that she was scheduled for a MRI today and stated that she was not able to have it done due to being extremely claustrophobic. Pt said she was prescribed a medication to help relax her prior to the procedure but even after taking the medication, she still could not have the MRI done and had to walk out.  Pt wants to know if a CT might be able to be done instead or what could be recommended. Dr. Genia Harold, please advise.

## 2021-11-17 DIAGNOSIS — G4733 Obstructive sleep apnea (adult) (pediatric): Secondary | ICD-10-CM | POA: Diagnosis not present

## 2021-11-17 NOTE — Telephone Encounter (Signed)
Spoke to patient and relayed below message/recommendations. Offered appt with Dr. Genia Harold to discuss this further. She stated that she would like to hold off on appt and she would like to think about orthopedic surgeon  referral. She will call back if she wishes to proceed.  Nothing further needed.  Routing to Dr. Genia Harold as an Juluis Rainier.

## 2021-11-23 DIAGNOSIS — D485 Neoplasm of uncertain behavior of skin: Secondary | ICD-10-CM | POA: Diagnosis not present

## 2021-11-23 DIAGNOSIS — D2272 Melanocytic nevi of left lower limb, including hip: Secondary | ICD-10-CM | POA: Diagnosis not present

## 2021-11-23 DIAGNOSIS — D2271 Melanocytic nevi of right lower limb, including hip: Secondary | ICD-10-CM | POA: Diagnosis not present

## 2021-11-23 DIAGNOSIS — L570 Actinic keratosis: Secondary | ICD-10-CM | POA: Diagnosis not present

## 2021-11-23 DIAGNOSIS — Z85828 Personal history of other malignant neoplasm of skin: Secondary | ICD-10-CM | POA: Diagnosis not present

## 2021-11-23 DIAGNOSIS — L308 Other specified dermatitis: Secondary | ICD-10-CM | POA: Diagnosis not present

## 2021-11-23 DIAGNOSIS — D2261 Melanocytic nevi of right upper limb, including shoulder: Secondary | ICD-10-CM | POA: Diagnosis not present

## 2021-11-25 ENCOUNTER — Encounter: Payer: Self-pay | Admitting: Internal Medicine

## 2021-11-25 NOTE — Telephone Encounter (Signed)
MR ordered by Dr Anette Riedel  Pt asking for valium rx to take prior to MR

## 2021-11-25 NOTE — Telephone Encounter (Signed)
Instead of valium, see if agreeable to lorazepam (ativan).  Someone will need to drive her home.  I can send in rx.

## 2021-11-28 MED ORDER — ALPRAZOLAM 0.25 MG PO TABS: ORAL_TABLET | ORAL | 0 refills | Status: DC

## 2021-11-28 NOTE — Telephone Encounter (Signed)
Pt has never taken Valium - agreeable to Xanax rx. Please send

## 2021-11-28 NOTE — Telephone Encounter (Signed)
Has she taken valium previously and tolerated.  If no, what about xanax?

## 2021-11-28 NOTE — Telephone Encounter (Signed)
Rx sent in for xanax - take one tablet prior to procedure and may repeat x 1.  Have husband drive.

## 2021-12-05 ENCOUNTER — Ambulatory Visit
Admission: RE | Admit: 2021-12-05 | Discharge: 2021-12-05 | Disposition: A | Discharge status: Home / Self Care | Payer: Medicare HMO | Source: Ambulatory Visit | Attending: Student in an Organized Health Care Education/Training Program | Admitting: Student in an Organized Health Care Education/Training Program

## 2021-12-05 ENCOUNTER — Encounter: Payer: Self-pay | Admitting: Internal Medicine

## 2021-12-05 NOTE — Telephone Encounter (Signed)
She needs to let the ordering physician know of the issues (problems) and see how they want to proceed.

## 2021-12-15 ENCOUNTER — Other Ambulatory Visit (INDEPENDENT_AMBULATORY_CARE_PROVIDER_SITE_OTHER): Payer: Medicare HMO

## 2021-12-15 DIAGNOSIS — R748 Abnormal levels of other serum enzymes: Secondary | ICD-10-CM

## 2021-12-15 DIAGNOSIS — R739 Hyperglycemia, unspecified: Secondary | ICD-10-CM | POA: Diagnosis not present

## 2021-12-15 DIAGNOSIS — E7800 Pure hypercholesterolemia, unspecified: Secondary | ICD-10-CM

## 2021-12-15 LAB — HEPATIC FUNCTION PANEL
ALT: 14 U/L (ref 0–35)
AST: 20 U/L (ref 0–37)
Albumin: 4.3 g/dL (ref 3.5–5.2)
Alkaline Phosphatase: 57 U/L (ref 39–117)
Bilirubin, Direct: 0.1 mg/dL (ref 0.0–0.3)
Total Bilirubin: 0.6 mg/dL (ref 0.2–1.2)
Total Protein: 7.1 g/dL (ref 6.0–8.3)

## 2021-12-15 LAB — BASIC METABOLIC PANEL
BUN: 14 mg/dL (ref 6–23)
CO2: 30 mEq/L (ref 19–32)
Calcium: 9.4 mg/dL (ref 8.4–10.5)
Chloride: 106 mEq/L (ref 96–112)
Creatinine, Ser: 0.88 mg/dL (ref 0.40–1.20)
GFR: 64.38 mL/min (ref >60.00)
Glucose, Bld: 102 mg/dL — ABNORMAL HIGH (ref 70–99)
Potassium: 4.2 mEq/L (ref 3.5–5.1)
Sodium: 141 mEq/L (ref 135–145)

## 2021-12-15 LAB — LIPID PANEL
Cholesterol: 149 mg/dL (ref 0–200)
HDL: 44.7 mg/dL (ref >39.00)
LDL Cholesterol: 76 mg/dL (ref 0–99)
NonHDL: 104.03
Total CHOL/HDL Ratio: 3
Triglycerides: 141 mg/dL (ref 0.0–149.0)
VLDL: 28.2 mg/dL (ref 0.0–40.0)

## 2021-12-15 LAB — HEMOGLOBIN A1C: Hgb A1c MFr Bld: 5.9 % (ref 4.6–6.5)

## 2021-12-20 ENCOUNTER — Ambulatory Visit (INDEPENDENT_AMBULATORY_CARE_PROVIDER_SITE_OTHER): Payer: Medicare HMO | Admitting: Internal Medicine

## 2021-12-20 ENCOUNTER — Encounter: Payer: Self-pay | Admitting: Internal Medicine

## 2021-12-20 VITALS — BP 116/70 | HR 54 | Temp 98.4°F | Resp 16 | Ht 66.0 in | Wt 130.8 lb

## 2021-12-20 DIAGNOSIS — R911 Solitary pulmonary nodule: Secondary | ICD-10-CM | POA: Diagnosis not present

## 2021-12-20 DIAGNOSIS — R739 Hyperglycemia, unspecified: Secondary | ICD-10-CM | POA: Diagnosis not present

## 2021-12-20 DIAGNOSIS — R051 Acute cough: Secondary | ICD-10-CM | POA: Diagnosis not present

## 2021-12-20 DIAGNOSIS — G4733 Obstructive sleep apnea (adult) (pediatric): Secondary | ICD-10-CM | POA: Diagnosis not present

## 2021-12-20 DIAGNOSIS — R748 Abnormal levels of other serum enzymes: Secondary | ICD-10-CM | POA: Diagnosis not present

## 2021-12-20 DIAGNOSIS — E7800 Pure hypercholesterolemia, unspecified: Secondary | ICD-10-CM | POA: Diagnosis not present

## 2021-12-20 DIAGNOSIS — Z86010 Personal history of colonic polyps: Secondary | ICD-10-CM

## 2021-12-20 DIAGNOSIS — I720 Aneurysm of carotid artery: Secondary | ICD-10-CM | POA: Diagnosis not present

## 2021-12-20 MED ORDER — AZITHROMYCIN 250 MG PO TABS: ORAL_TABLET | ORAL | 0 refills | Status: AC

## 2021-12-20 MED ORDER — PREDNISONE 10 MG PO TABS: ORAL_TABLET | ORAL | 0 refills | Status: DC

## 2021-12-20 NOTE — Patient Instructions (Signed)
Saline nasal spray - flush nose 1-2 x/day  Nasacort nasal spray - 2 sprays each nostril one time per day.  Do this in the evening.

## 2021-12-20 NOTE — Progress Notes (Deleted)
Patient ID: Dawn Wolfe, female   DOB: 1945-07-28, 76 y.o.   MRN: [id number]

## 2021-12-20 NOTE — Progress Notes (Signed)
Patient ID: Dawn Wolfe, female   DOB: [birth year], 76 y.o.   MRN: [id number]   Subjective:    Patient ID: Dawn Wolfe, female    DOB: [birth year], 76 y.o.   MRN: [id number]   Patient here for  Chief Complaint  Patient presents with   Cough   .   HPI Seeing Dr Genia Harold- f/u pulmonary nodule - recommended f/u CT chest in 3 months.  Did recommend MRI thoracici spine given changes on PET scan. She has been unable to do the MRI - due to anxiety.  Could not do it with lorazepam.  Desires not to pursue at this time.  Increased stress.  Husband is in the hospital.  Discussed.  She reports that starting last week, developed sore throat.  No fever.  No body aches.  Felt symptoms might have been related to getting up leaves.  Reports head congestion.  Yellow mucus production.  Increased cough.  Hoarseness.  No nausea or vomiting.   F/u MRA - 09/04/21 - stable.  CPAP   Past Medical History:  Diagnosis Date   Anemia    h/o as a child   Arthritis    left shoulder   Cancer ([place]) 15 yrs ago   [employer] on the face. Surgical removal   Complication of anesthesia    Hemorrhoids    History of colonoscopy 2003   Normal, Dr. Sammuel Cooper, [place]    Hypercholesterolemia    Normal exercise sestamibi stress test 2008   Ken Fath   Obstructive sleep apnea on CPAP 2007   PONV (postoperative nausea and vomiting)    Rotator cuff tear, left    Sleep apnea    Past Surgical History:  Procedure Laterality Date   ABDOMINALPLASTY W/ LIPOSUCTION  AGE 22   BUNIONECTOMY  X3  LAST ONE 2003   CHOLECYSTECTOMY N/A 04/19/2015   Procedure: LAPAROSCOPIC CHOLECYSTECTOMY WITH INTRAOPERATIVE CHOLANGIOGRAM;  Surgeon: Robert Bellow, MD;  Location: [facility] ORS;  Service: General;  Laterality: N/A;   COLONOSCOPY  2012   Dr Wyline Mood in [place] N/A 09/11/2016   Procedure: COLONOSCOPY WITH PROPOFOL;  Surgeon: Lollie Sails, MD;  Location: [facility] ENDOSCOPY;  Service: Endoscopy;  Laterality:  N/A;   ERCP N/A 04/29/2015   Procedure: ENDOSCOPIC RETROGRADE CHOLANGIOPANCREATOGRAPHY (ERCP);  Surgeon: Hulen Luster, MD;  Location: [facility] ENDOSCOPY;  Service: Gastroenterology;  Laterality: N/A;   EYE SURGERY     cataract extraction   NASAL SEPTUM SURGERY  AGE 22   NASAL SINUS SURGERY  AGE 63   SHOULDER ARTHROSCOPY  10/25/2011   Procedure: ARTHROSCOPY SHOULDER;  Surgeon: Magnus Sinning, MD;  Location: [place];  Service: Orthopedics;  Laterality: Left;  with SAD, shave of the labrium    TUBAL LIGATION  1982   Family History  Problem Relation Age of Onset   Acute myelogenous leukemia Sister    Polycythemia Sister 12   Emphysema Father        smoker   Heart disease Father        myocardial infarction - 36   COPD Father    Heart attack Father    Breast cancer Paternal Aunt    Testicular cancer Brother    Heart disease Mother    Emphysema Mother    COPD Mother    Asthma Mother    Heart attack Mother    Leukemia Sister    Colon cancer Neg Hx    Social History  Socioeconomic History   Marital status: Married    Spouse name: Not on file   Number of children: Not on file   Years of education: Not on file   Highest education level: Not on file  Occupational History   Not on file  Tobacco Use   Smoking status: Former    Packs/day: 2.50    Years: 10.00    Total pack years: 25.00    Types: Cigarettes    Quit date: 03/28/1977    Years since quitting: 44.7   Smokeless tobacco: Never  Substance and Sexual Activity   Alcohol use: No    Alcohol/week: 0.0 standard drinks of alcohol   Drug use: No   Sexual activity: Not Currently    Birth control/protection: Post-menopausal  Other Topics Concern   Not on file  Social History Narrative   Not on file   Social Determinants of Health   Financial Resource Strain: Low Risk  (05/06/2021)   Overall Financial Resource Strain (CARDIA)    Difficulty of Paying Living Expenses: Not hard at all  Food Insecurity: No  Food Insecurity (05/06/2021)   Hunger Vital Sign    Worried About Running Out of Food in the Last Year: Never true    Ran Out of Food in the Last Year: Never true  Transportation Needs: No Transportation Needs (05/06/2021)   PRAPARE - Hydrologist (Medical): No    Lack of Transportation (Non-Medical): No  Physical Activity: Sufficiently Active (05/06/2021)   Exercise Vital Sign    Days of Exercise per Week: 5 days    Minutes of Exercise per Session: 30 min  Stress: No Stress Concern Present (05/06/2021)   [place]    Feeling of Stress : Not at all  Social Connections: Unknown (05/06/2021)   Social Connection and Isolation Panel [NHANES]    Frequency of Communication with Friends and Family: Not on file    Frequency of Social Gatherings with Friends and Family: Not on file    Attends Religious Services: Not on file    Active Member of Clubs or Organizations: Not on file    Attends Archivist Meetings: Not on file    Marital Status: Married     Review of Systems  Constitutional:  Negative for appetite change and fever.  HENT:  Positive for congestion and sinus pressure.   Respiratory:  Positive for cough. Negative for chest tightness and shortness of breath.   Cardiovascular:  Negative for chest pain, palpitations and leg swelling.  Gastrointestinal:  Negative for abdominal pain, diarrhea, nausea and vomiting.  Genitourinary:  Negative for difficulty urinating and dysuria.  Skin:  Negative for color change and rash.  Neurological:  Negative for dizziness and headaches.  Psychiatric/Behavioral:  Negative for agitation and dysphoric mood.        Objective:     BP 116/70 (BP Location: Left Arm, Patient Position: Sitting, Cuff Size: Small)   Pulse (!) 54   Temp 98.4 F (36.9 C) (Temporal)   Resp 16   Ht _0  (1.676 m)   Wt 130 lb 12.8 oz (59.3 kg)   SpO2 96%   BMI 21.11  kg/m  Wt Readings from Last 3 Encounters:  12/20/21 130 lb 12.8 oz (59.3 kg)  10/17/21 133 lb 9.6 oz (60.6 kg)  09/28/21 134 lb 3.2 oz (60.9 kg)    Physical Exam Vitals reviewed.  Constitutional:      General:  She is not in acute distress.    Appearance: Normal appearance.  HENT:     Head: Normocephalic and atraumatic.     Right Ear: External ear normal.     Left Ear: External ear normal.  Eyes:     General: No scleral icterus.       Right eye: No discharge.        Left eye: No discharge.     Conjunctiva/sclera: Conjunctivae normal.  Neck:     Thyroid: No thyromegaly.  Cardiovascular:     Rate and Rhythm: Normal rate and regular rhythm.  Pulmonary:     Effort: No respiratory distress.     Breath sounds: Normal breath sounds. No wheezing.     Comments: Increased cough - forced expiration.  Abdominal:     General: Bowel sounds are normal.     Palpations: Abdomen is soft.     Tenderness: There is no abdominal tenderness.  Musculoskeletal:        General: No swelling or tenderness.     Cervical back: Neck supple. No tenderness.  Lymphadenopathy:     Cervical: No cervical adenopathy.  Skin:    Findings: No erythema or rash.  Neurological:     Mental Status: She is alert.  Psychiatric:        Mood and Affect: Mood normal.        Behavior: Behavior normal.      Outpatient Encounter Medications as of 12/20/2021  Medication Sig   aspirin EC 81 MG tablet Take 81 mg by mouth daily.   azithromycin (ZITHROMAX) 250 MG tablet Take 2 tablets on day 1, then 1 tablet daily on days 2 through 5   latanoprost (XALATAN) 0.005 % ophthalmic solution Place 1 drop into both eyes.   Multiple Vitamin (MULTIVITAMIN) capsule Take 1 capsule by mouth daily.   pravastatin (PRAVACHOL) 10 MG tablet Take 1 tablet (10 mg total) by mouth daily.   predniSONE (DELTASONE) 10 MG tablet Take 4 tablets x 1 day and then decrease by 1/2 tablet per day until down to zero mg.   [DISCONTINUED] ALPRAZolam  (XANAX) 0.25 MG tablet Take one tablet prior to procedure.  May repeat x 1.   [DISCONTINUED] MAGNESIUM PO Take by mouth.   No facility-administered encounter medications on file as of 12/20/2021.     Lab Results  Component Value Date   WBC 4.9 03/22/2021   HGB 13.1 03/22/2021   HCT 38.5 03/22/2021   PLT 190.0 03/22/2021   GLUCOSE 102 (H) 12/15/2021   CHOL 149 12/15/2021   TRIG 141.0 12/15/2021   HDL 44.70 12/15/2021   LDLDIRECT 88.0 07/22/2014   LDLCALC 76 12/15/2021   ALT 14 12/15/2021   AST 20 12/15/2021   NA 141 12/15/2021   K 4.2 12/15/2021   CL 106 12/15/2021   CREATININE 0.88 12/15/2021   BUN 14 12/15/2021   CO2 30 12/15/2021   TSH 2.46 03/22/2021   HGBA1C 5.9 12/15/2021       Assessment & Plan:   Problem List Items Addressed This Visit     Abnormal liver enzymes    Previous abdominal ultrasound - changes c/w fatty liver.  She has adjusted her diet.  Follow liver function tests.        Relevant Orders   Hepatic function panel   CBC w/Diff   Carotid artery aneurysm (HCC)   Cough    Increased cough and congestion as outlined.  Has been visiting in the hospital.  Discussed possible etiologies.  Nasal swab for RSV, covid and flu.  Saline nasal spray and steroid nasal spray as directed.  Robitussin DM.  Zpak.  Prednisone taper as directed.  Follow.  Call with update.  Quarantine guidelines.       Relevant Orders   COVID-19, Flu A+B and RSV (Completed)   History of colon polyps    Colonoscopy 08/2016.  Per pt - due.  Referred back to GI.       Hypercholesteremia - Primary    On pravastatin.  Low cholesterol diet and exercise.  Follow lipid panel and liver function tests.   Lab Results  Component Value Date   CHOL 149 12/15/2021   HDL 44.70 12/15/2021   LDLCALC 76 12/15/2021   LDLDIRECT 88.0 07/22/2014   TRIG 141.0 12/15/2021   CHOLHDL 3 12/15/2021       Relevant Orders   Basic Metabolic Panel (BMET)   Lipid Profile   Hyperglycemia    Continues to  watch diet.  Follow met b and a1c.   Lab Results  Component Value Date   HGBA1C 5.9 12/15/2021       Relevant Orders   HgB A1c   Obstructive sleep apnea    Continue cpap.       Pulmonary nodule    Saw pulmonary 09/2021 -repeat CT of the chest in 3 months for surveillance. Also,  MRI of the thoracic spine given the increase in FDG avidity on the PET.  Unable to do the MRI thoracic spine - due to anxiety.  Tried benzo prior.  Unable to complete.  Wants to hold on further scanning right now.  Follow.         Einar Pheasant, MD

## 2021-12-22 ENCOUNTER — Telehealth: Payer: Self-pay | Admitting: Internal Medicine

## 2021-12-22 LAB — COVID-19, FLU A+B AND RSV
Influenza A, NAA: NOT DETECTED
Influenza B, NAA: NOT DETECTED
RSV, NAA: NOT DETECTED
SARS-CoV-2, NAA: NOT DETECTED

## 2021-12-22 NOTE — Telephone Encounter (Signed)
Pt called wanting clarity on directions for prednisone. Pt stated to leave the message on mychart

## 2021-12-23 ENCOUNTER — Telehealth: Payer: Self-pay

## 2021-12-23 NOTE — Telephone Encounter (Signed)
[place]

## 2021-12-23 NOTE — Telephone Encounter (Signed)
MESSAGE SENT WITH DIRECTIONS

## 2021-12-25 ENCOUNTER — Encounter: Payer: Self-pay | Admitting: Internal Medicine

## 2021-12-25 DIAGNOSIS — R059 Cough, unspecified: Secondary | ICD-10-CM | POA: Insufficient documentation

## 2021-12-25 DIAGNOSIS — R053 Chronic cough: Secondary | ICD-10-CM | POA: Insufficient documentation

## 2021-12-25 NOTE — Assessment & Plan Note (Signed)
Colonoscopy 08/2016.  Per pt - due.  Referred back to GI.

## 2021-12-25 NOTE — Assessment & Plan Note (Signed)
Increased cough and congestion as outlined.  Has been visiting in the hospital.  Discussed possible etiologies.  Nasal swab for RSV, covid and flu.  Saline nasal spray and steroid nasal spray as directed.  Robitussin DM.  Zpak.  Prednisone taper as directed.  Follow.  Call with update.  Quarantine guidelines.

## 2021-12-25 NOTE — Assessment & Plan Note (Signed)
Continue cpap.  

## 2021-12-25 NOTE — Assessment & Plan Note (Signed)
Previous abdominal ultrasound - changes c/w fatty liver.  She has adjusted her diet.  Follow liver function tests.

## 2021-12-25 NOTE — Assessment & Plan Note (Signed)
Saw pulmonary 09/2021 -repeat CT of the chest in 3 months for surveillance. Also,  MRI of the thoracic spine given the increase in FDG avidity on the PET.  Unable to do the MRI thoracic spine - due to anxiety.  Tried benzo prior.  Unable to complete.  Wants to hold on further scanning right now.  Follow.  

## 2021-12-25 NOTE — Assessment & Plan Note (Signed)
Continues to watch diet.  Follow met b and a1c.   Lab Results  Component Value Date   HGBA1C 5.9 12/15/2021

## 2021-12-25 NOTE — Assessment & Plan Note (Signed)
On pravastatin.  Low cholesterol diet and exercise.  Follow lipid panel and liver function tests.   Lab Results  Component Value Date   CHOL 149 12/15/2021   HDL 44.70 12/15/2021   LDLCALC 76 12/15/2021   LDLDIRECT 88.0 07/22/2014   TRIG 141.0 12/15/2021   CHOLHDL 3 12/15/2021

## 2022-01-12 DIAGNOSIS — G4733 Obstructive sleep apnea (adult) (pediatric): Secondary | ICD-10-CM | POA: Diagnosis not present

## 2022-01-19 ENCOUNTER — Ambulatory Visit: Admission: RE | Admit: 2022-01-19 | Payer: Medicare HMO | Source: Ambulatory Visit

## 2022-01-19 ENCOUNTER — Ambulatory Visit
Admission: RE | Admit: 2022-01-19 | Discharge: 2022-01-19 | Disposition: A | Discharge status: Home / Self Care | Payer: Medicare HMO | Source: Ambulatory Visit | Attending: Student in an Organized Health Care Education/Training Program | Admitting: Student in an Organized Health Care Education/Training Program

## 2022-01-19 DIAGNOSIS — R911 Solitary pulmonary nodule: Secondary | ICD-10-CM | POA: Diagnosis not present

## 2022-01-19 DIAGNOSIS — R918 Other nonspecific abnormal finding of lung field: Secondary | ICD-10-CM | POA: Diagnosis not present

## 2022-01-19 DIAGNOSIS — J479 Bronchiectasis, uncomplicated: Secondary | ICD-10-CM | POA: Diagnosis not present

## 2022-01-24 ENCOUNTER — Ambulatory Visit: Payer: Medicare HMO | Admitting: Family Medicine

## 2022-01-27 ENCOUNTER — Other Ambulatory Visit: Payer: Self-pay

## 2022-01-27 ENCOUNTER — Emergency Department: Payer: Medicare HMO

## 2022-01-27 ENCOUNTER — Telehealth: Payer: Self-pay

## 2022-01-27 ENCOUNTER — Emergency Department
Admission: EM | Admit: 2022-01-27 | Discharge: 2022-01-27 | Disposition: A | Discharge status: Home / Self Care | Payer: Medicare HMO | Attending: Emergency Medicine | Admitting: Emergency Medicine

## 2022-01-27 DIAGNOSIS — I471 Supraventricular tachycardia, unspecified: Secondary | ICD-10-CM | POA: Diagnosis not present

## 2022-01-27 DIAGNOSIS — R55 Syncope and collapse: Secondary | ICD-10-CM

## 2022-01-27 DIAGNOSIS — M25552 Pain in left hip: Secondary | ICD-10-CM | POA: Diagnosis not present

## 2022-01-27 LAB — URINALYSIS, ROUTINE W REFLEX MICROSCOPIC
Bilirubin Urine: NEGATIVE
Glucose, UA: NEGATIVE mg/dL
Hgb urine dipstick: NEGATIVE
Ketones, ur: NEGATIVE mg/dL
Leukocytes,Ua: NEGATIVE
Nitrite: NEGATIVE
Protein, ur: NEGATIVE mg/dL
Specific Gravity, Urine: 1.003 — ABNORMAL LOW (ref 1.005–1.030)
pH: 6 (ref 5.0–8.0)

## 2022-01-27 LAB — BASIC METABOLIC PANEL
Anion gap: 10 (ref 5–15)
BUN: 22 mg/dL (ref 8–23)
CO2: 24 mmol/L (ref 22–32)
Calcium: 9.7 mg/dL (ref 8.9–10.3)
Chloride: 102 mmol/L (ref 98–111)
Creatinine, Ser: 0.79 mg/dL (ref 0.44–1.00)
GFR, Estimated: >60 mL/min (ref >60)
Glucose, Bld: 122 mg/dL — ABNORMAL HIGH (ref 70–99)
Potassium: 3.8 mmol/L (ref 3.5–5.1)
Sodium: 136 mmol/L (ref 135–145)

## 2022-01-27 LAB — TROPONIN I (HIGH SENSITIVITY): Troponin I (High Sensitivity): 3 ng/L (ref <18)

## 2022-01-27 LAB — CBC
HCT: 40 % (ref 36.0–46.0)
Hemoglobin: 13.2 g/dL (ref 12.0–15.0)
MCH: 29.3 pg (ref 26.0–34.0)
MCHC: 33 g/dL (ref 30.0–36.0)
MCV: 88.7 fL (ref 80.0–100.0)
Platelets: 184 10*3/uL (ref 150–400)
RBC: 4.51 MIL/uL (ref 3.87–5.11)
RDW: 13 % (ref 11.5–15.5)
WBC: 7.3 10*3/uL (ref 4.0–10.5)
nRBC: 0 % (ref 0.0–0.2)

## 2022-01-27 LAB — MAGNESIUM: Magnesium: 2.1 mg/dL (ref 1.7–2.4)

## 2022-01-27 MED ORDER — METOPROLOL TARTRATE 5 MG/5ML IV SOLN
5.0000 mg | INTRAVENOUS | Status: AC | PRN
  Administered 2022-01-27 (×3): 5 mg via INTRAVENOUS
  Filled 2022-01-27 (×3): qty 5

## 2022-01-27 MED ORDER — LACTATED RINGERS IV BOLUS
1000.0000 mL | Freq: Once | INTRAVENOUS | Status: AC
  Administered 2022-01-27: 1000 mL via INTRAVENOUS

## 2022-01-27 MED ORDER — METOPROLOL TARTRATE 25 MG PO TABS: 25.0000 mg | ORAL_TABLET | Freq: Two times a day (BID) | ORAL | 1 refills | Status: DC

## 2022-01-27 MED ORDER — ADENOSINE 12 MG/4ML IV SOLN
12.0000 mg | Freq: Once | INTRAVENOUS | Status: AC
  Administered 2022-01-27: 12 mg via INTRAVENOUS
  Filled 2022-01-27: qty 4

## 2022-01-27 NOTE — ED Provider Notes (Signed)
[facility] Provider Note    Event Date/Time   First MD Initiated Contact with Patient 01/27/22 1122     (approximate)   History   Chief Complaint Near Syncope   HPI  Dawn Wolfe is a 77 y.o. female with past medical history of hyperlipidemia and carotid aneurysm who presents to the ED complaining of syncope.  Patient reports that yesterday evening she had a hard time getting up off of the toilet due to pain in her left hip.  Her husband was eventually able to help her up and she was walking towards her recliner when she began to feel very lightheaded, like she was going to pass out.  She made it to her recliner, but shortly after doing so, lost consciousness for about a minute.  She denies any associated chest pain or shortness of breath, but when she woke up she felt nauseous with multiple episodes of vomiting and diarrhea.  She states she has felt well since then with no recurrent episodes, eventually decided to seek care in the ED today.  She denies any history of similar episodes, denies any cardiac history.  She does complain of ongoing pain in her left hip but denies any recent trauma to the hip.  She reports a history of carotid aneurysm, but does not have any headache or other neurologic symptoms.     Physical Exam   Triage Vital Signs: ED Triage Vitals  Enc Vitals Group     BP 01/27/22 1028 (!) 150/80     Pulse Rate 01/27/22 1028 (!) 105     Resp 01/27/22 1028 16     Temp 01/27/22 1028 98.5 F (36.9 C)     Temp src --      SpO2 01/27/22 1028 96 %     Weight 01/27/22 1028 134 lb (60.8 kg)     Height 01/27/22 1028 '5\' 6"'$  (1.676 m)     Head Circumference --      Peak Flow --      Pain Score 01/27/22 1021 0     Pain Loc --      Pain Edu? --      Excl. in [place]? --     Most recent vital signs: Vitals:   01/27/22 1245 01/27/22 1300  BP:  (!) 141/78  Pulse: (!) 134 79  Resp: 15 17  Temp:    SpO2: 98% 99%    Constitutional: Alert and  oriented. Eyes: Conjunctivae are normal. Head: Atraumatic. Nose: No congestion/rhinnorhea. Mouth/Throat: Mucous membranes are moist.  Cardiovascular: Tachycardic, regular rhythm. Grossly normal heart sounds.  2+ radial pulses bilaterally. Respiratory: Normal respiratory effort.  No retractions. Lungs CTAB. Gastrointestinal: Soft and nontender. No distention. Musculoskeletal: No lower extremity tenderness nor edema.  Neurologic:  Normal speech and language. No gross focal neurologic deficits are appreciated.    ED Results / Procedures / Treatments   Labs (all labs ordered are listed, but only abnormal results are displayed) Labs Reviewed  BASIC METABOLIC PANEL - Abnormal; Notable for the following components:      Result Value   Glucose, Bld 122 (*)    All other components within normal limits  URINALYSIS, ROUTINE W REFLEX MICROSCOPIC - Abnormal; Notable for the following components:   Color, Urine STRAW (*)    APPearance CLEAR (*)    Specific Gravity, Urine 1.003 (*)    All other components within normal limits  CBC  MAGNESIUM  CBG MONITORING, ED  TROPONIN I (HIGH  SENSITIVITY)     EKG  ED ECG REPORT I, Blake Divine, the attending physician, personally viewed and interpreted this ECG.   Date: 01/27/2022  EKG Time: 10:46  Rate: 109  Rhythm: sinus tachycardia  Axis: Normal  Intervals:none  ST&T Change: None  ED ECG REPORT I, Blake Divine, the attending physician, personally viewed and interpreted this ECG.   Date: 01/27/2022  EKG Time: 11:46  Rate: 152  Rhythm: Atrial flutter with 2:1 block vs sinus tachycardia  Axis: Normal  Intervals:none  ST&T Change: None  ED ECG REPORT I, Blake Divine, the attending physician, personally viewed and interpreted this ECG.   Date: 01/27/2022  EKG Time: 11:59  Rate: 142  Rhythm: Junctional Tachycardia  Axis: Normal  Intervals:none  ST&T Change: Diffuse ST depressions, likely rate related   RADIOLOGY CT head  reviewed and interpreted by me with no infiltrate, edema, or effusion.  PROCEDURES:  Critical Care performed: Yes, see critical care procedure note(s)  .Critical Care  Performed by: Blake Divine, MD Authorized by: Blake Divine, MD   Critical care provider statement:    Critical care time (minutes):  30   Critical care time was exclusive of:  Separately billable procedures and treating other patients and teaching time   Critical care was necessary to treat or prevent imminent or life-threatening deterioration of the following conditions:  Cardiac failure   Critical care was time spent personally by me on the following activities:  Development of treatment plan with patient or surrogate, discussions with consultants, evaluation of patient's response to treatment, examination of patient, ordering and review of laboratory studies, ordering and review of radiographic studies, ordering and performing treatments and interventions, pulse oximetry, re-evaluation of patient's condition and review of old charts   I assumed direction of critical care for this patient from another provider in my specialty: no      MEDICATIONS ORDERED IN ED: Medications  lactated ringers bolus 1,000 mL (0 mLs Intravenous Stopped 01/27/22 1255)  metoprolol tartrate (LOPRESSOR) injection 5 mg (5 mg Intravenous Given 01/27/22 1255)  adenosine (ADENOCARD) 12 MG/4ML injection 12 mg (12 mg Intravenous Given 01/27/22 1250)     IMPRESSION / MDM / [place] / ED COURSE  I reviewed the triage vital signs and the nursing notes.                              77 y.o. female with past medical history of hyperlipidemia and carotid aneurysm who presents to the ED complaining of syncopal episode yesterday after requiring help to get up from the toilet.  Patient's presentation is most consistent with acute presentation with potential threat to life or bodily function.  Differential diagnosis includes, but is not  limited to, arrhythmia, ACS, PE, pneumonia, electrolyte abnormality, anemia, AKI, vasovagal episode, orthostatic hypotension.  Patient well-appearing and in no acute distress, vital signs remarkable for tachycardia but otherwise reassuring.  She does report a history of carotid artery aneurysm, but no headache or focal neurologic deficits noted, CT head obtained from triage is negative for acute process.  EKG shows sinus tachycardia with no ischemic changes, will observe on cardiac monitor here in the ED and check troponin.  Remainder of labs are reassuring with no significant anemia, leukocytosis, lecture abnormality, or AKI.  Plan to hydrate with IV fluids and reassess.  Patient does complain of ongoing left hip pain, will further assess with x-ray.  Once placed on cardiac monitor, patient  noted to be significantly more tachycardic in the 150-160 range, appears most consistent with atrial flutter with 2-1 block on follow-up EKG.  Patient was given IV metoprolol with improvement in heart rate to the 130s, but now appears to be in SVT versus junctional tachycardia.  Findings reviewed with Dr. Clayborn Bigness of cardiology, who recommends 12 mg IV adenosine.  After administration, patient converted to normal sinus rhythm and Dr. Clayborn Bigness recommends follow-up dose of IV metoprolol for ongoing rate control.  Blood pressure remained stable and patient continues to be asymptomatic.  Labs are unremarkable with no significant anemia, leukocytosis, electrolyte abnormality, or AKI.  Troponin and magnesium are within normal limits.  Per cardiology, patient is appropriate for discharge home with close cardiology follow-up early next week, recommend starting her on 25 mg metoprolol twice daily.  Left hip x-ray is unremarkable.  She was counseled to return to the ED for new or worsening symptoms, patient agrees with plan.      FINAL CLINICAL IMPRESSION(S) / ED DIAGNOSES   Final diagnoses:  SVT (supraventricular tachycardia)   Syncope, unspecified syncope type     Rx / DC Orders   ED Discharge Orders          Ordered    metoprolol tartrate (LOPRESSOR) 25 MG tablet  2 times daily        01/27/22 1321             Note:  This document was prepared using Dragon voice recognition software and may include unintentional dictation errors.   Blake Divine, MD 01/27/22 1326

## 2022-01-27 NOTE — Telephone Encounter (Signed)
S/w Dawn Wolfe and Holley Raring - agreeable to go over to [facility] for eval. Advised needs eval due to syncope and [facility] can do XRAY for Hip if they deem appropriate. [name] agreeable, stated she will get dressed and they will head over.  Kathlene denies any injury from syncope episode - didn't hit head.  Felt like legs gave out.  Feeling well today, but still with hip pain.  No sob, dizziness, chest pain, weakness.

## 2022-01-27 NOTE — ED Notes (Signed)
Pt has had "excruciating" right hip for the last 5 days. She took Aleve which helped and used Lidocaine patch which took rest of pain away. Pain occurs with "bending or getting out of chair". Requested x-ray of hip. Believes nausea and syncope from yesterday was related to the pain.

## 2022-01-27 NOTE — ED Triage Notes (Addendum)
Pt comes with c/o syncopal episode yesterday. Family reports that pt went out and had blank stare for about 3 minutes. Pt then came to and was all over the place. Pt also had episode of diarrhea and vomiting after.  Pt denies any complaints today.  No thinners. Pt does have hx of brain aneurysm

## 2022-01-27 NOTE — Telephone Encounter (Signed)
Dawn Wolfe states patient has been experiencing left hip pain for a few days.  Dawn Wolfe states patient states it was hard for her to get up yesterday and she passed out, then woke up and threw up several times and broke out in a cold sweat and had a headache.  Dawn Wolfe states patient states she is fine today but still has left hip pain.  Vera recommended she go to the ED, but patient refused.

## 2022-01-27 NOTE — Telephone Encounter (Signed)
Reviewed message and reviewed chart.  Pt in ER now.

## 2022-01-30 DIAGNOSIS — G4733 Obstructive sleep apnea (adult) (pediatric): Secondary | ICD-10-CM | POA: Diagnosis not present

## 2022-01-30 DIAGNOSIS — I471 Supraventricular tachycardia, unspecified: Secondary | ICD-10-CM | POA: Diagnosis not present

## 2022-01-30 DIAGNOSIS — R002 Palpitations: Secondary | ICD-10-CM | POA: Diagnosis not present

## 2022-01-31 NOTE — Telephone Encounter (Signed)
Late entry from Access nurse 01/27/22  [place] RECORD AccessNurse Patient Name: Dawn Wolfe Gender: Female DOB: 30-Jun-1945 Age: 77 Y 33 M Return Phone Number: [phone] (Primary), [phone] (Secondary) Address: City/ State/ Zip: [place]  [address] Client [facility] Night - Cl Client Site [place] Provider Einar Pheasant - MD Contact Type Call Who Is Calling Patient / Member / Family / Caregiver Call Type Triage / Clinical Relationship To Patient Self Return Phone Number [phone] (Secondary) Chief Complaint Back Pain - General Reason for Call Symptomatic / Request for Health Information Initial Comment Caller states back has given out for past few days; Translation No Nurse Assessment Nurse: Raphael Gibney, RN, Vanita Ingles Date/Time (Eastern Time): 01/27/2022 8:27:21 AM Confirm and document reason for call. If symptomatic, describe symptoms. ---Caller states she has had left hip pain. Yesterday, she could hardly get up from the commode. Her spouse said she passed out in chair and woke up vomiting and broke out in a cold sweat. Vomited several times. Feels fine today. Drinking coffee and has urinated. headache yesterday but not today. pain level 10 when she tries to get up. no fever. Does the patient have any new or worsening symptoms? ---Yes Will a triage be completed? ---Yes Related visit to physician within the last 2 weeks? ---No Does the PT have any chronic conditions? (i.e. diabetes, asthma, this includes High risk factors for pregnancy, etc.) ---Yes List chronic conditions. ---brain aneurysm Is this a behavioral health or substance abuse call? ---No Guidelines Guideline Title Affirmed Question Affirmed Notes Nurse Date/Time (Eastern Time) Fainting [1] Age > 26 years AND [2] now alert and feels fine Raphael Gibney, RN, Vanita Ingles  01/27/2022 8:35:07 AM PLEASE NOTE: All timestamps contained within this report are represented as [place] Standard Time. CONFIDENTIALTY NOTICE: This fax transmission is intended only for the addressee. It contains information that is legally privileged, confidential or otherwise protected from use or disclosure. If you are not the intended recipient, you are strictly prohibited from reviewing, disclosing, copying using or disseminating any of this information or taking any action in reliance on or regarding this information. If you have received this fax in error, please notify us immediately by telephone so that we can arrange for its return to [place]. Phone: [phone], Toll-Free: [phone], Fax: [phone] Page: 2 of 2 Call Id: [id number] [place]. Time Eilene Ghazi Time) Disposition Final User 01/27/2022 8:39:03 AM Go to ED Now (or PCP triage) Yes Raphael Gibney, RN, Vanita Ingles Final Disposition 01/27/2022 8:39:03 AM Go to ED Now (or PCP triage) Yes Raphael Gibney, RN, Doreatha Lew Disagree/Comply Disagree Caller Understands Yes PreDisposition [place] Advice Given Per Guideline GO TO ED NOW (OR PCP TRIAGE): * IF NO PCP (PRIMARY CARE PROVIDER) SECOND-LEVEL TRIAGE: You need to be seen within the next hour. Go to the [facility] at _____________ [place] as soon as you can. ANOTHER ADULT SHOULD DRIVE: * It is better and safer if another adult drives instead of you. CARE ADVICE given per Fainting (Adult) guideline. CALL BACK IF: * You become worse Comments User: Dannielle Burn, RN Date/Time Eilene Ghazi Time): 01/27/2022 8:39:39 AM pt does not want to go to urgent care or the ER User: Dannielle Burn, RN Date/Time Eilene Ghazi Time): 01/27/2022 8:49:17 AM Called office and spoke to Dustin and gave report has had left hip pain. Yesterday, she could hardly get up from the commode. Her spouse said she passed out in chair and woke up  vomiting and broke out in a cold sweat. Vomited several times. Feels fine today.  Drinking coffee and has urinated. headache yesterday but not today. pain level 10 when she tries to get up. Triage outcome go to ER now ( or PCP triage) but pt does not want to go. Referrals GO TO FACILITY REFUSED

## 2022-02-02 DIAGNOSIS — H40153 Residual stage of open-angle glaucoma, bilateral: Secondary | ICD-10-CM | POA: Diagnosis not present

## 2022-02-22 ENCOUNTER — Encounter: Payer: Self-pay | Admitting: Internal Medicine

## 2022-02-22 ENCOUNTER — Ambulatory Visit (INDEPENDENT_AMBULATORY_CARE_PROVIDER_SITE_OTHER): Payer: Medicare HMO | Admitting: Internal Medicine

## 2022-02-22 VITALS — BP 120/68 | HR 70 | Temp 98.1°F | Resp 16 | Ht 66.0 in | Wt 127.6 lb

## 2022-02-22 DIAGNOSIS — R748 Abnormal levels of other serum enzymes: Secondary | ICD-10-CM

## 2022-02-22 DIAGNOSIS — G4733 Obstructive sleep apnea (adult) (pediatric): Secondary | ICD-10-CM

## 2022-02-22 DIAGNOSIS — E7800 Pure hypercholesterolemia, unspecified: Secondary | ICD-10-CM | POA: Diagnosis not present

## 2022-02-22 DIAGNOSIS — I471 Supraventricular tachycardia, unspecified: Secondary | ICD-10-CM | POA: Diagnosis not present

## 2022-02-22 DIAGNOSIS — I720 Aneurysm of carotid artery: Secondary | ICD-10-CM | POA: Diagnosis not present

## 2022-02-22 DIAGNOSIS — B349 Viral infection, unspecified: Secondary | ICD-10-CM

## 2022-02-22 DIAGNOSIS — R739 Hyperglycemia, unspecified: Secondary | ICD-10-CM | POA: Diagnosis not present

## 2022-02-22 DIAGNOSIS — R911 Solitary pulmonary nodule: Secondary | ICD-10-CM

## 2022-02-22 NOTE — Progress Notes (Signed)
Subjective:    Patient ID: Dawn Wolfe, female    DOB: 1945-05-09, 77 y.o.   MRN: [id number]  Patient here for  Chief Complaint  Patient presents with   Medical Management of Chronic Issues   Hyperlipidemia    HPI Here to follow up regarding hypercholesterolemia, increased stress and recent ER visit - syncope.  Presented to ER 01/27/22 - with reported increased hip pain.  Increased pain trying to get up off the toilet.  While walking to her recliner, felt like she was going to pass out.  Reported - lost consciousness for approximately one minute.  After coming to, increased nausea and vomiting and diarrhea.  CT head - no acute abnormality.  Given IVFs. Also noted - SVT - given IV adenosine.  Started on metoprolol.  Left hip xray - unremarkable.  Saw Dr Clayborn Bigness 01/30/22 - referred to Dr Mylinda Latina - EP. States has had two episodes - watching TV - increased heart rate - lasted approximately 15 min.  Took extra metoprolol.  Reports Saturday before last, sore throat.  Next morning - arms and legs heavy.  Decreased appetite.  Blowing nose.  Just started eating a little more.  Husband diagnosed with covid. She has not checked.  Similar symptoms.  Starting to feel better.  No vomiting.   MRI thoracic spine   Past Medical History:  Diagnosis Date   Anemia    h/o as a child   Arthritis    left shoulder   Cancer ([place]) 15 yrs ago   [employer] on the face. Surgical removal   Complication of anesthesia    Hemorrhoids    History of colonoscopy 2003   Normal, Dr. Sammuel Cooper, [place]    Hypercholesterolemia    Normal exercise sestamibi stress test 2008   [facility]   Obstructive sleep apnea on CPAP 2007   PONV (postoperative nausea and vomiting)    Rotator cuff tear, left    Sleep apnea    Past Surgical History:  Procedure Laterality Date   ABDOMINALPLASTY W/ LIPOSUCTION  AGE 52   BUNIONECTOMY  X3  LAST ONE 2003   CHOLECYSTECTOMY N/A 04/19/2015   Procedure: LAPAROSCOPIC CHOLECYSTECTOMY WITH  INTRAOPERATIVE CHOLANGIOGRAM;  Surgeon: Robert Bellow, MD;  Location: [facility] ORS;  Service: General;  Laterality: N/A;   COLONOSCOPY  2012   Dr Wyline Mood in [place] N/A 09/11/2016   Procedure: COLONOSCOPY WITH PROPOFOL;  Surgeon: Lollie Sails, MD;  Location: [facility] ENDOSCOPY;  Service: Endoscopy;  Laterality: N/A;   ERCP N/A 04/29/2015   Procedure: ENDOSCOPIC RETROGRADE CHOLANGIOPANCREATOGRAPHY (ERCP);  Surgeon: Hulen Luster, MD;  Location: [facility] ENDOSCOPY;  Service: Gastroenterology;  Laterality: N/A;   EYE SURGERY     cataract extraction   NASAL SEPTUM SURGERY  AGE 36   NASAL SINUS SURGERY  AGE 36   SHOULDER ARTHROSCOPY  10/25/2011   Procedure: ARTHROSCOPY SHOULDER;  Surgeon: Magnus Sinning, MD;  Location: [facility];  Service: Orthopedics;  Laterality: Left;  with SAD, shave of the labrium    TUBAL LIGATION  1982   Family History  Problem Relation Age of Onset   Acute myelogenous leukemia Sister    Polycythemia Sister 67   Emphysema Father        smoker   Heart disease Father        myocardial infarction - 67   COPD Father    Heart attack Father    Breast cancer Paternal Aunt  Testicular cancer Brother    Heart disease Mother    Emphysema Mother    COPD Mother    Asthma Mother    Heart attack Mother    Leukemia Sister    Colon cancer Neg Hx    Social History   Socioeconomic History   Marital status: Married    Spouse name: Not on file   Number of children: Not on file   Years of education: Not on file   Highest education level: Not on file  Occupational History   Not on file  Tobacco Use   Smoking status: Former    Packs/day: 2.50    Years: 10.00    Total pack years: 25.00    Types: Cigarettes    Quit date: 03/28/1977    Years since quitting: 44.9   Smokeless tobacco: Never  Substance and Sexual Activity   Alcohol use: No    Alcohol/week: 0.0 standard drinks of alcohol   Drug use: No   Sexual activity:  Not Currently    Birth control/protection: Post-menopausal  Other Topics Concern   Not on file  Social History Narrative   Not on file   Social Determinants of Health   Financial Resource Strain: Low Risk  (05/06/2021)   Overall Financial Resource Strain (CARDIA)    Difficulty of Paying Living Expenses: Not hard at all  Food Insecurity: No Food Insecurity (05/06/2021)   Hunger Vital Sign    Worried About Running Out of Food in the Last Year: Never true    [place] in the Last Year: Never true  Transportation Needs: No Transportation Needs (05/06/2021)   PRAPARE - Hydrologist (Medical): No    Lack of Transportation (Non-Medical): No  Physical Activity: Sufficiently Active (05/06/2021)   Exercise Vital Sign    Days of Exercise per Week: 5 days    Minutes of Exercise per Session: 30 min  Stress: No Stress Concern Present (05/06/2021)   [place]    Feeling of Stress : Not at all  Social Connections: Unknown (05/06/2021)   Social Connection and Isolation Panel [NHANES]    Frequency of Communication with Friends and Family: Not on file    Frequency of Social Gatherings with Friends and Family: Not on file    Attends Religious Services: Not on file    Active Member of Clubs or Organizations: Not on file    Attends Archivist Meetings: Not on file    Marital Status: Married     Review of Systems  Constitutional:  Positive for appetite change and fatigue. Negative for fever.  HENT:  Positive for congestion. Negative for sinus pressure.   Respiratory:  Negative for cough and chest tightness.        No increased sob.   Cardiovascular:  Negative for chest pain and leg swelling.       Episodes of increased heart rate as outlined.   Gastrointestinal:  Negative for abdominal pain, diarrhea, nausea and vomiting.  Genitourinary:  Negative for difficulty urinating and dysuria.   Musculoskeletal:  Negative for joint swelling and myalgias.  Skin:  Negative for color change and rash.  Neurological:  Negative for dizziness and headaches.  Psychiatric/Behavioral:  Negative for agitation and dysphoric mood.        Objective:     BP 120/68   Pulse 70   Temp 98.1 F (36.7 C) (Temporal)   Resp 16  Ht 5' 6"$  (1.676 m)   Wt 127 lb 9.6 oz (57.9 kg)   SpO2 98%   BMI 20.60 kg/m  Wt Readings from Last 3 Encounters:  02/22/22 127 lb 9.6 oz (57.9 kg)  01/27/22 134 lb (60.8 kg)  12/20/21 130 lb 12.8 oz (59.3 kg)    Physical Exam Vitals reviewed.  Constitutional:      General: She is not in acute distress.    Appearance: Normal appearance.  HENT:     Head: Normocephalic and atraumatic.     Right Ear: External ear normal.     Left Ear: External ear normal.  Eyes:     General: No scleral icterus.       Right eye: No discharge.        Left eye: No discharge.     Conjunctiva/sclera: Conjunctivae normal.  Neck:     Thyroid: No thyromegaly.  Cardiovascular:     Rate and Rhythm: Normal rate and regular rhythm.  Pulmonary:     Effort: No respiratory distress.     Breath sounds: Normal breath sounds. No wheezing.  Abdominal:     General: Bowel sounds are normal.     Palpations: Abdomen is soft.     Tenderness: There is no abdominal tenderness.  Musculoskeletal:        General: No swelling or tenderness.     Cervical back: Neck supple. No tenderness.  Lymphadenopathy:     Cervical: No cervical adenopathy.  Skin:    Findings: No erythema or rash.  Neurological:     Mental Status: She is alert.  Psychiatric:        Mood and Affect: Mood normal.        Behavior: Behavior normal.      Outpatient Encounter Medications as of 02/22/2022  Medication Sig   aspirin EC 81 MG tablet Take 81 mg by mouth daily.   latanoprost (XALATAN) 0.005 % ophthalmic solution Place 1 drop into both eyes.   metoprolol tartrate (LOPRESSOR) 25 MG tablet Take 1 tablet (25 mg  total) by mouth 2 (two) times daily.   Multiple Vitamin (MULTIVITAMIN) capsule Take 1 capsule by mouth daily.   pravastatin (PRAVACHOL) 10 MG tablet Take 1 tablet (10 mg total) by mouth daily.   predniSONE (DELTASONE) 10 MG tablet Take 4 tablets x 1 day and then decrease by 1/2 tablet per day until down to zero mg.   No facility-administered encounter medications on file as of 02/22/2022.     Lab Results  Component Value Date   WBC 7.6 02/22/2022   HGB 13.3 02/22/2022   HCT 38.1 02/22/2022   PLT 210.0 02/22/2022   GLUCOSE 106 (H) 02/22/2022   CHOL 145 02/22/2022   TRIG 325.0 (H) 02/22/2022   HDL 32.30 (L) 02/22/2022   LDLDIRECT 70.0 02/22/2022   LDLCALC 76 12/15/2021   ALT 16 02/22/2022   AST 19 02/22/2022   NA 140 02/22/2022   K 4.4 02/22/2022   CL 103 02/22/2022   CREATININE 0.87 02/22/2022   BUN 20 02/22/2022   CO2 27 02/22/2022   TSH 2.05 02/22/2022   HGBA1C 5.8 02/22/2022    DG Hip Unilat W or Wo Pelvis 2-3 Views Left  Result Date: 01/27/2022 CLINICAL DATA:  Right hip pain for 5 days. EXAM: DG HIP (WITH OR WITHOUT PELVIS) 2-3V LEFT COMPARISON:  None Available. FINDINGS: There is no acute fracture or dislocation. Femoroacetabular alignment is maintained bilaterally. The joint spaces are preserved, with no significant degenerative change. There  is no erosive change. The SI joints and symphysis pubis are intact. The soft tissues are unremarkable. IMPRESSION: Normal hip radiographs. Electronically Signed   By: Valetta Mole M.D.   On: 01/27/2022 12:09   CT HEAD WO CONTRAST  Result Date: 01/27/2022 CLINICAL DATA:  Syncopal episode yesterday. EXAM: CT HEAD WITHOUT CONTRAST TECHNIQUE: Contiguous axial images were obtained from the base of the skull through the vertex without intravenous contrast. RADIATION DOSE REDUCTION: This exam was performed according to the departmental dose-optimization program which includes automated exposure control, adjustment of the mA and/or kV according  to patient size and/or use of iterative reconstruction technique. COMPARISON:  MRA head on 09/04/2021 FINDINGS: Brain: No evidence of acute infarction, hemorrhage, hydrocephalus, extra-axial collection or mass lesion/mass effect. Vascular: No hyperdense vessel or unexpected calcification. Skull: Normal. Negative for fracture or focal lesion. Sinuses/Orbits: No acute finding. Other: None. IMPRESSION: No acute intracranial pathology. Electronically Signed   By: Nolon Nations M.D.   On: 01/27/2022 10:49       Assessment & Plan:  SVT (supraventricular tachycardia) Assessment & Plan: Recent ER evaluation as outlined.  Saw Dr Clayborn Bigness.  Started on metoprolol.  Referred to Dr Marcello Moores.    Orders: -     Basic metabolic panel -     TSH  Abnormal liver enzymes Assessment & Plan: Previous abdominal ultrasound - changes c/w fatty liver.  Follow liver function tests.    Orders: -     CBC with Differential/Platelet -     Hepatic function panel  Hyperglycemia Assessment & Plan: Continues to watch diet.  Follow met b and a1c.   Lab Results  Component Value Date   HGBA1C 5.8 02/22/2022    Orders: -     Hemoglobin A1c  Hypercholesteremia Assessment & Plan: On pravastatin.  Low cholesterol diet and exercise.  Follow lipid panel and liver function tests.   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  Just had f/u MRA (08/2021). Stable.   Obstructive sleep apnea Assessment & Plan: Continue cpap.    Pulmonary nodule Assessment & Plan: Saw pulmonary 09/2021 -repeat CT of the chest in 3 months for surveillance. Also,  MRI of the thoracic spine given the increase in FDG avidity  on the PET.  Unable to do the MRI thoracic spine - due to anxiety.  Tried benzo prior.  Unable to complete.  Wants to hold on further scanning right now.  Follow.    Viral infection Assessment & Plan: Husband recently diagnosed with covid.  Her symptoms have been similar (as outlined).  Started to eat a little more.  Energy improving.  Continue to stay hydrated.  Follow.     Other orders -     LDL cholesterol, direct     Einar Pheasant, MD

## 2022-02-23 LAB — LIPID PANEL
Cholesterol: 145 mg/dL (ref 0–200)
HDL: 32.3 mg/dL — ABNORMAL LOW (ref >39.00)
NonHDL: 112.63
Total CHOL/HDL Ratio: 4
Triglycerides: 325 mg/dL — ABNORMAL HIGH (ref 0.0–149.0)
VLDL: 65 mg/dL — ABNORMAL HIGH (ref 0.0–40.0)

## 2022-02-23 LAB — BASIC METABOLIC PANEL
BUN: 20 mg/dL (ref 6–23)
CO2: 27 mEq/L (ref 19–32)
Calcium: 9.7 mg/dL (ref 8.4–10.5)
Chloride: 103 mEq/L (ref 96–112)
Creatinine, Ser: 0.87 mg/dL (ref 0.40–1.20)
GFR: 65.18 mL/min (ref >60.00)
Glucose, Bld: 106 mg/dL — ABNORMAL HIGH (ref 70–99)
Potassium: 4.4 mEq/L (ref 3.5–5.1)
Sodium: 140 mEq/L (ref 135–145)

## 2022-02-23 LAB — CBC WITH DIFFERENTIAL/PLATELET
Basophils Absolute: 0 10*3/uL (ref 0.0–0.1)
Basophils Relative: 0.6 % (ref 0.0–3.0)
Eosinophils Absolute: 0.2 10*3/uL (ref 0.0–0.7)
Eosinophils Relative: 2.6 % (ref 0.0–5.0)
HCT: 38.1 % (ref 36.0–46.0)
Hemoglobin: 13.3 g/dL (ref 12.0–15.0)
Lymphocytes Relative: 23 % (ref 12.0–46.0)
Lymphs Abs: 1.7 10*3/uL (ref 0.7–4.0)
MCHC: 34.9 g/dL (ref 30.0–36.0)
MCV: 86.9 fl (ref 78.0–100.0)
Monocytes Absolute: 0.4 10*3/uL (ref 0.1–1.0)
Monocytes Relative: 5.7 % (ref 3.0–12.0)
Neutro Abs: 5.2 10*3/uL (ref 1.4–7.7)
Neutrophils Relative %: 68.1 % (ref 43.0–77.0)
Platelets: 210 10*3/uL (ref 150.0–400.0)
RBC: 4.38 Mil/uL (ref 3.87–5.11)
RDW: 13.3 % (ref 11.5–15.5)
WBC: 7.6 10*3/uL (ref 4.0–10.5)

## 2022-02-23 LAB — HEPATIC FUNCTION PANEL
ALT: 16 U/L (ref 0–35)
AST: 19 U/L (ref 0–37)
Albumin: 4.4 g/dL (ref 3.5–5.2)
Alkaline Phosphatase: 60 U/L (ref 39–117)
Bilirubin, Direct: 0.1 mg/dL (ref 0.0–0.3)
Total Bilirubin: 0.5 mg/dL (ref 0.2–1.2)
Total Protein: 7 g/dL (ref 6.0–8.3)

## 2022-02-23 LAB — TSH: TSH: 2.05 u[IU]/mL (ref 0.35–5.50)

## 2022-02-23 LAB — LDL CHOLESTEROL, DIRECT: Direct LDL: 70 mg/dL

## 2022-02-23 LAB — HEMOGLOBIN A1C: Hgb A1c MFr Bld: 5.8 % (ref 4.6–6.5)

## 2022-02-26 ENCOUNTER — Encounter: Payer: Self-pay | Admitting: Internal Medicine

## 2022-02-26 ENCOUNTER — Telehealth: Payer: Self-pay | Admitting: Internal Medicine

## 2022-02-26 DIAGNOSIS — B349 Viral infection, unspecified: Secondary | ICD-10-CM | POA: Insufficient documentation

## 2022-02-26 NOTE — Assessment & Plan Note (Signed)
Continue cpap.  

## 2022-02-26 NOTE — Assessment & Plan Note (Addendum)
Reviewed with neurology - recommended f/u MRA in one year.  Just had f/u MRA (08/2021). Stable.

## 2022-02-26 NOTE — Assessment & Plan Note (Signed)
Saw pulmonary 09/2021 -repeat CT of the chest in 3 months for surveillance. Also,  MRI of the thoracic spine given the increase in FDG avidity on the PET.  Unable to do the MRI thoracic spine - due to anxiety.  Tried benzo prior.  Unable to complete.  Wants to hold on further scanning right now.  Follow.

## 2022-02-26 NOTE — Assessment & Plan Note (Signed)
Recent ER evaluation as outlined.  Saw Dr Clayborn Bigness.  Started on metoprolol.  Referred to Dr Marcello Moores.

## 2022-02-26 NOTE — Telephone Encounter (Signed)
Please cancel fasting labs scheduled in 4 weeks.  Just had fasting labs.

## 2022-02-26 NOTE — Assessment & Plan Note (Signed)
On pravastatin.  Low cholesterol diet and exercise.  Follow lipid panel and liver function tests.   Lab Results  Component Value Date   CHOL 145 02/22/2022   HDL 32.30 (L) 02/22/2022   LDLCALC 76 12/15/2021   LDLDIRECT 70.0 02/22/2022   TRIG 325.0 (H) 02/22/2022   CHOLHDL 4 02/22/2022

## 2022-02-26 NOTE — Assessment & Plan Note (Signed)
Continues to watch diet.  Follow met b and a1c.   Lab Results  Component Value Date   HGBA1C 5.8 02/22/2022

## 2022-02-26 NOTE — Assessment & Plan Note (Signed)
Previous abdominal ultrasound - changes c/w fatty liver.  Follow liver function tests.

## 2022-02-26 NOTE — Assessment & Plan Note (Signed)
Husband recently diagnosed with covid.  Her symptoms have been similar (as outlined).  Started to eat a little more.  Energy improving.  Continue to stay hydrated.  Follow.

## 2022-02-27 NOTE — Telephone Encounter (Signed)
Appt cancelled patient is aware

## 2022-03-02 DIAGNOSIS — G4733 Obstructive sleep apnea (adult) (pediatric): Secondary | ICD-10-CM | POA: Diagnosis not present

## 2022-03-09 DIAGNOSIS — G4733 Obstructive sleep apnea (adult) (pediatric): Secondary | ICD-10-CM | POA: Diagnosis not present

## 2022-03-13 ENCOUNTER — Ambulatory Visit: Payer: Medicare HMO | Admitting: Student in an Organized Health Care Education/Training Program

## 2022-03-13 VITALS — BP 120/72 | HR 85 | Temp 97.8°F | Ht 66.0 in | Wt 135.6 lb

## 2022-03-13 DIAGNOSIS — R911 Solitary pulmonary nodule: Secondary | ICD-10-CM

## 2022-03-13 NOTE — Progress Notes (Signed)
Synopsis: Referred in for pulmonary nodule by Einar Pheasant, MD  Assessment & Plan:   1. Pulmonary nodule 1 cm or greater in diameter  Presenting with a 10 mm LLL solid nodule and similarly a ground glass nodule in the RLL. The nodule is not spiculated, with no associated lymphadenopathy and is not PET avid on CT. The LLL nodule exhibits 1.4% risk of being malignant based on the [facility] SPN risk score calculator. She is ECOG 0. The nodule has been stable on repeat chest CT.   Today, we also discussed the importance of diagnosis in lung malignancies, and discussed the differential for the nodule with benign possibilities as well as the possibility of malignancy. We discussed the approach to obtaining a diagnosis which would include robotic assisted navigational bronchoscopy vs. watchful waiting with serial imaging. I explained to the patients that her options include obtaining a repeat chest CT vs robotic assisted navigational bronchoscopy. Patient is adamant about not having a biopsy and would prefer continuing with surveillance of the nodule. Will obtain a repeat chest CT in 6 months.  - CT CHEST WO CONTRAST; Future   Return in about 5 months (around 08/17/2022).  I spent 30 minutes caring for this patient today, including preparing to see the patient, obtaining a medical history , reviewing a separately obtained history, performing a medically appropriate examination and/or evaluation, counseling and educating the patient/family/caregiver, ordering medications, tests, or procedures, and documenting clinical information in the electronic health record  Armando Reichert, MD [facility] 03/13/2022 3:13 PM    End of visit medications:  No orders of the defined types were placed in this encounter.    Current Outpatient Medications:    aspirin EC 81 MG tablet, Take 81 mg by mouth daily., Disp: , Rfl:    latanoprost (XALATAN) 0.005 % ophthalmic solution, Place 1 drop  into the left eye., Disp: , Rfl: 3   metoprolol tartrate (LOPRESSOR) 25 MG tablet, Take 1 tablet (25 mg total) by mouth 2 (two) times daily., Disp: 60 tablet, Rfl: 1   Multiple Vitamin (MULTIVITAMIN) capsule, Take 1 capsule by mouth daily., Disp: , Rfl:    pravastatin (PRAVACHOL) 10 MG tablet, Take 1 tablet (10 mg total) by mouth daily., Disp: 90 tablet, Rfl: 3   predniSONE (DELTASONE) 10 MG tablet, Take 4 tablets x 1 day and then decrease by 1/2 tablet per day until down to zero mg. (Patient not taking: Reported on 03/13/2022), Disp: 18 tablet, Rfl: 0   Subjective:   PATIENT ID: Dawn Wolfe GENDER: female DOB: 1946/01/10, MRN: [id number]  Chief Complaint  Patient presents with   Follow-up    Lung Nodule. CT 01/19/2022.     HPI  Dawn Wolfe is a pleasant 77 year old female presenting for follow up. I saw her on 09/28/2021 for the evaluation of an incidental pulmonary nodule. She is here to discuss the result of the follow up CT.   Overall, she has not had any change in symptoms. She feels well. She has no shortness of breath, cough, chest pain, wheezing, chest tightness, or other constitutional symptoms. She had COVID over the winter that was self limiting.  She was unable to do the MRI due to claustrophobia (did not have any benefit with valium).   She tells me that a friend of hers recommended that she get a coronary calcium screen which she obtained on 09/06/2021.  The scan was noted to have a solid pulmonary nodule in the left  lower lobe and a groundglass nodule in the right lower lobe. We had ordered a dedicated CT of the chest and a PET/CT during our last visit and she is here to discuss this. She does have a history of sleep apnea and sees Rexene Edison, NP for it.  She reports compliance with her CPAP and that it has made her sleep and energy levels a lot better.   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  She is a non-smoker and does not  drink.  Ancillary information including prior medications, full medical/surgical/family/social histories, and PFTs (when available) are listed below and have been reviewed.   Review of Systems  Constitutional:  Negative for chills, fever and weight loss (no unintentional weight loss).  Respiratory:  Negative for cough, hemoptysis, sputum production, shortness of breath and wheezing.   Cardiovascular:  Negative for chest pain and orthopnea.  Gastrointestinal:  Negative for heartburn.  Skin:  Negative for rash.     Objective:   Vitals:   03/13/22 1435  BP: 120/72  Pulse: 85  Temp: 97.8 F (36.6 C)  SpO2: 98%  Weight: 135 lb 9.6 oz (61.5 kg)  Height: '5\' 6"'$  (1.676 m)   98% on RA BMI Readings from Last 3 Encounters:  03/13/22 21.89 kg/m  02/22/22 20.60 kg/m  01/27/22 21.63 kg/m   Wt Readings from Last 3 Encounters:  03/13/22 135 lb 9.6 oz (61.5 kg)  02/22/22 127 lb 9.6 oz (57.9 kg)  01/27/22 134 lb (60.8 kg)    Physical Exam Constitutional:      Appearance: Normal appearance.  HENT:     Head: Normocephalic.     Mouth/Throat:     Mouth: Mucous membranes are moist.  Cardiovascular:     Rate and Rhythm: Normal rate and regular rhythm.     Heart sounds: Normal heart sounds.  Pulmonary:     Effort: Pulmonary effort is normal.     Breath sounds: Normal breath sounds.  Musculoskeletal:        General: Normal range of motion.     Cervical back: Normal range of motion.  Skin:    General: Skin is warm.  Neurological:     General: No focal deficit present.     Mental Status: She is alert and oriented to person, place, and time.       Ancillary Information    Past Medical History:  Diagnosis Date   Anemia    h/o as a child   Arthritis    left shoulder   Cancer ([place]) 15 yrs ago   [employer] on the face. Surgical removal   Complication of anesthesia    Hemorrhoids    History of colonoscopy 2003   Normal, Dr. Sammuel Cooper, [place]    Hypercholesterolemia     Normal exercise sestamibi stress test 2008   Ken Fath   Obstructive sleep apnea on CPAP 2007   PONV (postoperative nausea and vomiting)    Rotator cuff tear, left    Sleep apnea      Family History  Problem Relation Age of Onset   Acute myelogenous leukemia Sister    Polycythemia Sister 81   Emphysema Father        smoker   Heart disease Father        myocardial infarction - 104   COPD Father    Heart attack Father    Breast cancer Paternal Aunt    Testicular cancer Brother    Heart disease  Mother    Emphysema Mother    COPD Mother    Asthma Mother    Heart attack Mother    Leukemia Sister    Colon cancer Neg Hx      Past Surgical History:  Procedure Laterality Date   ABDOMINALPLASTY W/ LIPOSUCTION  AGE 66   BUNIONECTOMY  X3  LAST ONE 2003   CHOLECYSTECTOMY N/A 04/19/2015   Procedure: LAPAROSCOPIC CHOLECYSTECTOMY WITH INTRAOPERATIVE CHOLANGIOGRAM;  Surgeon: Robert Bellow, MD;  Location: [facility] ORS;  Service: General;  Laterality: N/A;   COLONOSCOPY  2012   Dr Wyline Mood in [place] N/A 09/11/2016   Procedure: COLONOSCOPY WITH PROPOFOL;  Surgeon: Lollie Sails, MD;  Location: [facility] ENDOSCOPY;  Service: Endoscopy;  Laterality: N/A;   ERCP N/A 04/29/2015   Procedure: ENDOSCOPIC RETROGRADE CHOLANGIOPANCREATOGRAPHY (ERCP);  Surgeon: Hulen Luster, MD;  Location: [facility] ENDOSCOPY;  Service: Gastroenterology;  Laterality: N/A;   EYE SURGERY     cataract extraction   NASAL SEPTUM SURGERY  AGE 68   NASAL SINUS SURGERY  AGE 46   SHOULDER ARTHROSCOPY  10/25/2011   Procedure: ARTHROSCOPY SHOULDER;  Surgeon: Magnus Sinning, MD;  Location: [place];  Service: Orthopedics;  Laterality: Left;  with SAD, shave of the labrium    TUBAL LIGATION  1982    Social History   Socioeconomic History   Marital status: Married    Spouse name: Not on file   Number of children: Not on file   Years of education: Not on file   Highest education  level: Not on file  Occupational History   Not on file  Tobacco Use   Smoking status: Former    Packs/day: 2.50    Years: 10.00    Total pack years: 25.00    Types: Cigarettes    Quit date: 03/28/1977    Years since quitting: 44.9   Smokeless tobacco: Never  Substance and Sexual Activity   Alcohol use: No    Alcohol/week: 0.0 standard drinks of alcohol   Drug use: No   Sexual activity: Not Currently    Birth control/protection: Post-menopausal  Other Topics Concern   Not on file  Social History Narrative   Not on file   Social Determinants of Health   Financial Resource Strain: Low Risk  (05/06/2021)   Overall Financial Resource Strain (CARDIA)    Difficulty of Paying Living Expenses: Not hard at all  Food Insecurity: No Food Insecurity (05/06/2021)   Hunger Vital Sign    Worried About Running Out of Food in the Last Year: Never true    Ran Out of Food in the Last Year: Never true  Transportation Needs: No Transportation Needs (05/06/2021)   PRAPARE - Hydrologist (Medical): No    Lack of Transportation (Non-Medical): No  Physical Activity: Sufficiently Active (05/06/2021)   Exercise Vital Sign    Days of Exercise per Week: 5 days    Minutes of Exercise per Session: 30 min  Stress: No Stress Concern Present (05/06/2021)   [place]    Feeling of Stress : Not at all  Social Connections: Unknown (05/06/2021)   Social Connection and Isolation Panel [NHANES]    Frequency of Communication with Friends and Family: Not on file    Frequency of Social Gatherings with Friends and Family: Not on file    Attends Religious Services: Not on file  Active Member of Clubs or Organizations: Not on file    Attends Club or Organization Meetings: Not on file    Marital Status: Married  Intimate Partner Violence: Not At Risk (05/06/2021)   Humiliation, Afraid, Rape, and Kick questionnaire    Fear  of Current or Ex-Partner: No    Emotionally Abused: No    Physically Abused: No    Sexually Abused: No     Allergies  Allergen Reactions   Codeine Nausea And Vomiting     CBC    Component Value Date/Time   WBC 7.6 02/22/2022 1351   RBC 4.38 02/22/2022 1351   HGB 13.3 02/22/2022 1351   HCT 38.1 02/22/2022 1351   PLT 210.0 02/22/2022 1351   MCV 86.9 02/22/2022 1351   MCH 29.3 01/27/2022 1022   MCHC 34.9 02/22/2022 1351   RDW 13.3 02/22/2022 1351   LYMPHSABS 1.7 02/22/2022 1351   MONOABS 0.4 02/22/2022 1351   EOSABS 0.2 02/22/2022 1351   BASOSABS 0.0 02/22/2022 1351    Pulmonary Functions Testing Results:     No data to display          Outpatient Medications Prior to Visit  Medication Sig Dispense Refill   aspirin EC 81 MG tablet Take 81 mg by mouth daily.     latanoprost (XALATAN) 0.005 % ophthalmic solution Place 1 drop into the left eye.  3   metoprolol tartrate (LOPRESSOR) 25 MG tablet Take 1 tablet (25 mg total) by mouth 2 (two) times daily. 60 tablet 1   Multiple Vitamin (MULTIVITAMIN) capsule Take 1 capsule by mouth daily.     pravastatin (PRAVACHOL) 10 MG tablet Take 1 tablet (10 mg total) by mouth daily. 90 tablet 3   predniSONE (DELTASONE) 10 MG tablet Take 4 tablets x 1 day and then decrease by 1/2 tablet per day until down to zero mg. (Patient not taking: Reported on 03/13/2022) 18 tablet 0   No facility-administered medications prior to visit.

## 2022-03-22 ENCOUNTER — Other Ambulatory Visit: Payer: Medicare HMO

## 2022-03-22 DIAGNOSIS — G4733 Obstructive sleep apnea (adult) (pediatric): Secondary | ICD-10-CM | POA: Diagnosis not present

## 2022-03-31 DIAGNOSIS — G4733 Obstructive sleep apnea (adult) (pediatric): Secondary | ICD-10-CM | POA: Diagnosis not present

## 2022-03-31 DIAGNOSIS — I471 Supraventricular tachycardia, unspecified: Secondary | ICD-10-CM | POA: Diagnosis not present

## 2022-05-01 DIAGNOSIS — G4733 Obstructive sleep apnea (adult) (pediatric): Secondary | ICD-10-CM | POA: Diagnosis not present

## 2022-05-01 DIAGNOSIS — I471 Supraventricular tachycardia, unspecified: Secondary | ICD-10-CM | POA: Diagnosis not present

## 2022-05-01 DIAGNOSIS — R030 Elevated blood-pressure reading, without diagnosis of hypertension: Secondary | ICD-10-CM | POA: Diagnosis not present

## 2022-05-01 DIAGNOSIS — E7800 Pure hypercholesterolemia, unspecified: Secondary | ICD-10-CM | POA: Diagnosis not present

## 2022-05-01 DIAGNOSIS — I5032 Chronic diastolic (congestive) heart failure: Secondary | ICD-10-CM | POA: Diagnosis not present

## 2022-05-01 DIAGNOSIS — R002 Palpitations: Secondary | ICD-10-CM | POA: Diagnosis not present

## 2022-05-01 DIAGNOSIS — R000 Tachycardia, unspecified: Secondary | ICD-10-CM | POA: Diagnosis not present

## 2022-05-03 ENCOUNTER — Encounter: Payer: Self-pay | Admitting: Internal Medicine

## 2022-05-03 ENCOUNTER — Ambulatory Visit (INDEPENDENT_AMBULATORY_CARE_PROVIDER_SITE_OTHER): Payer: Medicare HMO | Admitting: Internal Medicine

## 2022-05-03 VITALS — BP 118/70 | HR 83 | Temp 98.0°F | Resp 16 | Ht 66.0 in | Wt 130.8 lb

## 2022-05-03 DIAGNOSIS — G4733 Obstructive sleep apnea (adult) (pediatric): Secondary | ICD-10-CM | POA: Diagnosis not present

## 2022-05-03 DIAGNOSIS — R739 Hyperglycemia, unspecified: Secondary | ICD-10-CM

## 2022-05-03 DIAGNOSIS — I471 Supraventricular tachycardia, unspecified: Secondary | ICD-10-CM | POA: Diagnosis not present

## 2022-05-03 DIAGNOSIS — E7800 Pure hypercholesterolemia, unspecified: Secondary | ICD-10-CM

## 2022-05-03 DIAGNOSIS — R479 Unspecified speech disturbances: Secondary | ICD-10-CM | POA: Diagnosis not present

## 2022-05-03 NOTE — Progress Notes (Unsigned)
Subjective:    Patient ID: Dawn Wolfe, female    DOB: May 06, 1945, 77 y.o.   MRN: [id number]  Patient here for  Chief Complaint  Patient presents with   Numbness    Left hand numb and tingling, unable to speak 1-2 minutes     HPI Work in appt.  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  Was with her husband.  Noticed when she went to say something to him  - could not get the work kleenex out.  She knew what she wanted to say, but could not get the word out.  Also noticed left hand numbness.  Left hand numbness only lasted for a brief period - seconds.  Checked out and went out to the car.  States she just did not feel right.  The whole episode lasted approximately 5-7 minutes.  Husband reports no slurring of speech.  No facial drooping.  No other neurological changes.  No headache.  No dizziness.  No light headedness.  No chest pain or sob.  No increased heart rate or palpitations.  No other episodes.  Stays active. Planning for cardiac ablation next week.     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 Sherin Quarry, [place]    Hypercholesterolemia    Normal exercise sestamibi stress test 2008   Ken Fath   Obstructive sleep apnea on CPAP 2007   PONV (postoperative nausea and vomiting)    Rotator cuff tear, left    Sleep apnea    Past Surgical History:  Procedure Laterality Date   ABDOMINALPLASTY W/ LIPOSUCTION  AGE 53   BUNIONECTOMY  X3  LAST ONE 2003   CHOLECYSTECTOMY N/A 04/19/2015   Procedure: LAPAROSCOPIC CHOLECYSTECTOMY WITH INTRAOPERATIVE CHOLANGIOGRAM;  Surgeon: Earline Mayotte, MD;  Location: [facility] ORS;  Service: General;  Laterality: N/A;   COLONOSCOPY  2012   Dr Robin Searing in [place]   COLONOSCOPY WITH PROPOFOL N/A 09/11/2016   Procedure:  COLONOSCOPY WITH PROPOFOL;  Surgeon: Christena Deem, MD;  Location: [facility] ENDOSCOPY;  Service: Endoscopy;  Laterality: N/A;   ERCP N/A 04/29/2015   Procedure: ENDOSCOPIC RETROGRADE CHOLANGIOPANCREATOGRAPHY (ERCP);  Surgeon: Wallace Cullens, MD;  Location: [facility] ENDOSCOPY;  Service: Gastroenterology;  Laterality: N/A;   EYE SURGERY     cataract extraction   NASAL SEPTUM SURGERY  AGE 46   NASAL SINUS SURGERY  AGE 39   SHOULDER ARTHROSCOPY  10/25/2011   Procedure: ARTHROSCOPY SHOULDER;  Surgeon: Drucilla Schmidt, MD;  Location: [facility];  Service: Orthopedics;  Laterality: Left;  with SAD, shave of the labrium    TUBAL LIGATION  1982   Family History  Problem Relation Age of Onset   Acute myelogenous leukemia Sister    Polycythemia Sister 57   Emphysema Father        smoker   Heart disease Father        myocardial infarction - 67   COPD Father    Heart attack Father    Breast cancer Paternal Aunt    Testicular cancer Brother    Heart disease Mother    Emphysema Mother    COPD Mother  Asthma Mother    Heart attack Mother    Leukemia Sister    Colon cancer Neg Hx    Social History   Socioeconomic History   Marital status: Married    Spouse name: Not on file   Number of children: Not on file   Years of education: Not on file   Highest education level: Not on file  Occupational History   Not on file  Tobacco Use   Smoking status: Former    Packs/day: 2.50    Years: 10.00    Additional pack years: 0.00    Total pack years: 25.00    Types: Cigarettes    Quit date: 03/28/1977    Years since quitting: 45.1   Smokeless tobacco: Never  Substance and Sexual Activity   Alcohol use: No    Alcohol/week: 0.0 standard drinks of alcohol   Drug use: No   Sexual activity: Not Currently    Birth control/protection: Post-menopausal  Other Topics Concern   Not on file  Social History Narrative   Not on file   Social Determinants of Health   Financial Resource  Strain: Low Risk  (05/06/2021)   Overall Financial Resource Strain (CARDIA)    Difficulty of Paying Living Expenses: Not hard at all  Food Insecurity: No Food Insecurity (05/06/2021)   Hunger Vital Sign    Worried About Running Out of Food in the Last Year: Never true    Ran Out of Food in the Last Year: Never true  Transportation Needs: No Transportation Needs (05/06/2021)   PRAPARE - Administrator, Civil Service (Medical): No    Lack of Transportation (Non-Medical): No  Physical Activity: Sufficiently Active (05/06/2021)   Exercise Vital Sign    Days of Exercise per Week: 5 days    Minutes of Exercise per Session: 30 min  Stress: No Stress Concern Present (05/06/2021)   [employer] of Occupational Health - Occupational Stress Questionnaire    Feeling of Stress : Not at all  Social Connections: Unknown (05/06/2021)   Social Connection and Isolation Panel [NHANES]    Frequency of Communication with Friends and Family: Not on file    Frequency of Social Gatherings with Friends and Family: Not on file    Attends Religious Services: Not on file    Active Member of Clubs or Organizations: Not on file    Attends Banker Meetings: Not on file    Marital Status: Married     Review of Systems  Constitutional:  Negative for appetite change and unexpected weight change.  HENT:  Negative for congestion and sinus pressure.   Respiratory:  Negative for cough, chest tightness and shortness of breath.   Cardiovascular:  Negative for chest pain and palpitations.  Gastrointestinal:  Negative for abdominal pain, diarrhea, nausea and vomiting.  Genitourinary:  Negative for difficulty urinating and dysuria.  Musculoskeletal:  Negative for joint swelling and myalgias.  Skin:  Negative for color change and rash.  Neurological:  Positive for speech difficulty. Negative for dizziness and headaches.  Psychiatric/Behavioral:  Negative for agitation and dysphoric mood.         Objective:     BP 118/70   Pulse 83   Temp 98 F (36.7 C)   Resp 16   Ht 5\' 6"  (1.676 m)   Wt 130 lb 12.8 oz (59.3 kg)   SpO2 98%   BMI 21.11 kg/m  Wt Readings from Last 3 Encounters:  05/03/22 130 lb 12.8 oz (  59.3 kg)  03/13/22 135 lb 9.6 oz (61.5 kg)  02/22/22 127 lb 9.6 oz (57.9 kg)    Physical Exam Vitals reviewed.  Constitutional:      General: She is not in acute distress.    Appearance: Normal appearance.  HENT:     Head: Normocephalic and atraumatic.     Right Ear: External ear normal.     Left Ear: External ear normal.  Eyes:     General: No scleral icterus.       Right eye: No discharge.        Left eye: No discharge.     Conjunctiva/sclera: Conjunctivae normal.  Neck:     Thyroid: No thyromegaly.  Cardiovascular:     Rate and Rhythm: Normal rate and regular rhythm.  Pulmonary:     Effort: No respiratory distress.     Breath sounds: Normal breath sounds. No wheezing.  Abdominal:     General: Bowel sounds are normal.     Palpations: Abdomen is soft.     Tenderness: There is no abdominal tenderness.  Musculoskeletal:        General: No swelling or tenderness.     Cervical back: Neck supple. No tenderness.  Skin:    Findings: No erythema or rash.  Neurological:     Mental Status: She is alert.     Comments: No focal neurological deficits currently.   Psychiatric:        Mood and Affect: Mood normal.        Behavior: Behavior normal.      Outpatient Encounter Medications as of 05/03/2022  Medication Sig   aspirin EC 81 MG tablet Take 81 mg by mouth daily.   latanoprost (XALATAN) 0.005 % ophthalmic solution Place 1 drop into the left eye.   metoprolol tartrate (LOPRESSOR) 25 MG tablet Take 1 tablet (25 mg total) by mouth 2 (two) times daily.   Multiple Vitamin (MULTIVITAMIN) capsule Take 1 capsule by mouth daily.   pravastatin (PRAVACHOL) 10 MG tablet Take 1 tablet (10 mg total) by mouth daily.   [DISCONTINUED] predniSONE (DELTASONE) 10 MG  tablet Take 4 tablets x 1 day and then decrease by 1/2 tablet per day until down to zero mg. (Patient not taking: Reported on 03/13/2022)   No facility-administered encounter medications on file as of 05/03/2022.     Lab Results  Component Value Date   WBC 7.6 02/22/2022   HGB 13.3 02/22/2022   HCT 38.1 02/22/2022   PLT 210.0 02/22/2022   GLUCOSE 106 (H) 02/22/2022   CHOL 145 02/22/2022   TRIG 325.0 (H) 02/22/2022   HDL 32.30 (L) 02/22/2022   LDLDIRECT 70.0 02/22/2022   LDLCALC 76 12/15/2021   ALT 16 02/22/2022   AST 19 02/22/2022   NA 140 02/22/2022   K 4.4 02/22/2022   CL 103 02/22/2022   CREATININE 0.87 02/22/2022   BUN 20 02/22/2022   CO2 27 02/22/2022   TSH 2.05 02/22/2022   HGBA1C 5.8 02/22/2022    DG Hip Unilat W or Wo Pelvis 2-3 Views Left  Result Date: 01/27/2022 CLINICAL DATA:  Right hip pain for 5 days. EXAM: DG HIP (WITH OR WITHOUT PELVIS) 2-3V LEFT COMPARISON:  None Available. FINDINGS: There is no acute fracture or dislocation. Femoroacetabular alignment is maintained bilaterally. The joint spaces are preserved, with no significant degenerative change. There is no erosive change. The SI joints and symphysis pubis are intact. The soft tissues are unremarkable. IMPRESSION: Normal hip radiographs. Electronically Signed  By: Lesia Hausen M.D.   On: 01/27/2022 12:09   CT HEAD WO CONTRAST  Result Date: 01/27/2022 CLINICAL DATA:  Syncopal episode yesterday. EXAM: CT HEAD WITHOUT CONTRAST TECHNIQUE: Contiguous axial images were obtained from the base of the skull through the vertex without intravenous contrast. RADIATION DOSE REDUCTION: This exam was performed according to the departmental dose-optimization program which includes automated exposure control, adjustment of the mA and/or kV according to patient size and/or use of iterative reconstruction technique. COMPARISON:  MRA head on 09/04/2021 FINDINGS: Brain: No evidence of acute infarction, hemorrhage, hydrocephalus,  extra-axial collection or mass lesion/mass effect. Vascular: No hyperdense vessel or unexpected calcification. Skull: Normal. Negative for fracture or focal lesion. Sinuses/Orbits: No acute finding. Other: None. IMPRESSION: No acute intracranial pathology. Electronically Signed   By: Norva Pavlov M.D.   On: 01/27/2022 10:49       Assessment & Plan:  Difficulty with speech Assessment & Plan: Had the recent episode as outlined.  Knew what she wanted to say. Could not get the word out. Concern - expressive aphasia with associated left hand tingling. Question of TIA.  No weakness.  The episode lasted 5-7 minutes.  No residual problems.  No recurrence.  On aspirin daily.  Planning for ablation next week.  Will hold on additional anticoagulation given upcoming procedure, but she does need to continue aspirin daily.  Will obtain CT head.  She declines MRI.  Discussed open MRI.  She continues to decline.  Will obtain carotid ultrasound.  Pursue above testing.  Continue cpap for sleep apnea. Explained to her if symptoms recur, change, she is to be evaluated immediately.  Discussed neurology f/u - they will contact her with appt.    Orders: -     CT HEAD WO CONTRAST ( ); Future -     VAS US CAROTID; Future  Hypercholesteremia Assessment & Plan: On pravastatin.  Low cholesterol diet and exercise.  Follow lipid panel and liver function tests.   Lab Results  Component Value Date   CHOL 145 02/22/2022   HDL 32.30 (L) 02/22/2022   LDLCALC 76 12/15/2021   LDLDIRECT 70.0 02/22/2022   TRIG 325.0 (H) 02/22/2022   CHOLHDL 4 02/22/2022     Hyperglycemia Assessment & Plan: Continues to watch diet.  Follow met b and a1c.   Lab Results  Component Value Date   HGBA1C 5.8 02/22/2022     Obstructive sleep apnea Assessment & Plan: Continue cpap.    SVT (supraventricular tachycardia) Assessment & Plan: Seeing Dr Maisie Fus.  Planning for ablation next week.  Continue daily aspirin.        Dale [place], MD

## 2022-05-04 ENCOUNTER — Encounter: Payer: Self-pay | Admitting: Internal Medicine

## 2022-05-04 ENCOUNTER — Ambulatory Visit
Admission: RE | Admit: 2022-05-04 | Discharge: 2022-05-04 | Disposition: A | Discharge status: Home / Self Care | Payer: Medicare HMO | Source: Ambulatory Visit | Attending: Internal Medicine | Admitting: Internal Medicine

## 2022-05-04 DIAGNOSIS — R479 Unspecified speech disturbances: Secondary | ICD-10-CM | POA: Diagnosis not present

## 2022-05-04 DIAGNOSIS — G459 Transient cerebral ischemic attack, unspecified: Secondary | ICD-10-CM | POA: Diagnosis not present

## 2022-05-04 NOTE — Assessment & Plan Note (Signed)
Continues to watch diet.  Follow met b and a1c.   Lab Results  Component Value Date   HGBA1C 5.8 02/22/2022   

## 2022-05-04 NOTE — Assessment & Plan Note (Signed)
Continue cpap.  

## 2022-05-04 NOTE — Assessment & Plan Note (Signed)
On pravastatin.  Low cholesterol diet and exercise.  Follow lipid panel and liver function tests.   Lab Results  Component Value Date   CHOL 145 02/22/2022   HDL 32.30 (L) 02/22/2022   LDLCALC 76 12/15/2021   LDLDIRECT 70.0 02/22/2022   TRIG 325.0 (H) 02/22/2022   CHOLHDL 4 02/22/2022   

## 2022-05-04 NOTE — Assessment & Plan Note (Addendum)
Had the recent episode as outlined.  Knew what she wanted to say. Could not get the word out. Concern - expressive aphasia with associated left hand tingling. Question of TIA.  No weakness.  The episode lasted 5-7 minutes.  No residual problems.  No recurrence.  On aspirin daily.  Planning for ablation next week.  Will hold on additional anticoagulation given upcoming procedure, but she does need to continue aspirin daily.  Will obtain CT head.  She declines MRI.  Discussed open MRI.  She continues to decline.  Will obtain carotid ultrasound.  Pursue above testing.  Continue cpap for sleep apnea. Explained to her if symptoms recur, change, she is to be evaluated immediately.  Discussed neurology f/u - they will contact her with appt.

## 2022-05-04 NOTE — Assessment & Plan Note (Signed)
Seeing Dr Maisie Fus.  Planning for ablation next week.  Continue daily aspirin.

## 2022-05-08 ENCOUNTER — Telehealth: Payer: Self-pay | Admitting: Internal Medicine

## 2022-05-08 NOTE — Telephone Encounter (Signed)
Contacted Dawn Wolfe to schedule their annual wellness visit. Appointment made for 05/19/2022.  Thank you,  [facility] Support [facility] Direct dial  [phone]

## 2022-05-11 ENCOUNTER — Ambulatory Visit
Admission: EM | Admit: 2022-05-11 | Discharge: 2022-05-11 | Disposition: A | Discharge status: Home / Self Care | Payer: Medicare HMO

## 2022-05-11 ENCOUNTER — Ambulatory Visit (INDEPENDENT_AMBULATORY_CARE_PROVIDER_SITE_OTHER): Payer: Medicare HMO

## 2022-05-11 DIAGNOSIS — R479 Unspecified speech disturbances: Secondary | ICD-10-CM | POA: Diagnosis not present

## 2022-05-11 DIAGNOSIS — J209 Acute bronchitis, unspecified: Secondary | ICD-10-CM

## 2022-05-11 DIAGNOSIS — J0100 Acute maxillary sinusitis, unspecified: Secondary | ICD-10-CM | POA: Diagnosis not present

## 2022-05-11 MED ORDER — AZITHROMYCIN 250 MG PO TABS: 250.0000 mg | ORAL_TABLET | Freq: Every day | ORAL | 0 refills | Status: DC

## 2022-05-11 NOTE — ED Provider Notes (Signed)
Dawn Wolfe    CSN: [id number] Arrival date & time: 05/11/22  4098      History   Chief Complaint Chief Complaint  Patient presents with   Nasal Congestion    HPI Dawn Wolfe is a 77 y.o. female.  Patient presents with nasal congestion and cough x 4 days.  Her voice has become hoarse and her cough has become productive of yellow phlegm.  She denies fever, shortness of breath, chest pain, or other symptoms.  Treating with Zyrtec and promethazine DM.  Patient states she needs to get well quickly as she has cardiac ablation scheduled in May.  Her medical history includes seasonal allergies, obstructive sleep apnea, paroxysmal SVT, chronic diastolic heart failure, carotid artery aneurysm, pulmonary nodule.  The history is provided by the patient and medical records.    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 Sherin Quarry, [place]    Hypercholesterolemia    Normal exercise sestamibi stress test 2008   Ken Fath   Obstructive sleep apnea on CPAP 2007   PONV (postoperative nausea and vomiting)    Rotator cuff tear, left    Sleep apnea     Patient Active Problem List   Diagnosis Date Noted   Difficulty with speech 05/04/2022   Viral infection 02/26/2022   SVT (supraventricular tachycardia) 02/22/2022   Cough 12/25/2021   Pulmonary nodule 10/18/2021   Increased heart rate 03/22/2021   Carotid artery aneurysm 11/21/2020   Upper back pain 07/09/2020   Back pain 03/11/2020   Renal cyst 03/09/2019   Double vision 10/19/2018   Loose stools 07/25/2018   Hypoglycemia 02/18/2017   History of colon polyps 11/03/2016   Family history of colonic polyps 02/13/2016   Hyperglycemia 02/11/2016   Fluttering sensation of heart 08/11/2015   Choledocholithiasis 04/28/2015    Rash and nonspecific skin eruption 03/18/2015   Health care maintenance 02/07/2015   Environmental allergies 01/12/2014   Elevated blood pressure reading 03/30/2013   Obstructive sleep apnea 04/03/2012   Hypercholesteremia 11/15/2011   Abnormal liver enzymes 11/15/2011    Past Surgical History:  Procedure Laterality Date   ABDOMINALPLASTY W/ LIPOSUCTION  AGE 20   BUNIONECTOMY  X3  LAST ONE 2003   CHOLECYSTECTOMY N/A 04/19/2015   Procedure: LAPAROSCOPIC CHOLECYSTECTOMY WITH INTRAOPERATIVE CHOLANGIOGRAM;  Surgeon: Earline Mayotte, MD;  Location: [facility] ORS;  Service: General;  Laterality: N/A;   COLONOSCOPY  2012   Dr Robin Searing in [place]   COLONOSCOPY WITH PROPOFOL N/A 09/11/2016   Procedure: COLONOSCOPY WITH PROPOFOL;  Surgeon: Christena Deem, MD;  Location: [facility] ENDOSCOPY;  Service: Endoscopy;  Laterality: N/A;   ERCP N/A 04/29/2015   Procedure: ENDOSCOPIC RETROGRADE CHOLANGIOPANCREATOGRAPHY (ERCP);  Surgeon: Wallace Cullens, MD;  Location: [facility] ENDOSCOPY;  Service: Gastroenterology;  Laterality: N/A;   EYE SURGERY     cataract extraction   NASAL SEPTUM SURGERY  AGE 65   NASAL SINUS SURGERY  AGE 65   SHOULDER ARTHROSCOPY  10/25/2011   Procedure: ARTHROSCOPY SHOULDER;  Surgeon: Drucilla Schmidt, MD;  Location: [facility];  Service: Orthopedics;  Laterality: Left;  with SAD, shave of the labrium    TUBAL LIGATION  1982  OB History     Gravida  2   Para      Term      Preterm      AB  2   Living  0      SAB  2   IAB      Ectopic      Multiple      Live Births           Obstetric Comments  Menstrual age: 82  Age 1st Pregnancy:           Home Medications    Prior to Admission medications   Medication Sig Start Date End Date Taking? Authorizing Provider  azithromycin (ZITHROMAX) 250 MG tablet Take 1 tablet (250 mg total) by mouth daily. Take first 2 tablets together, then 1 every day until finished. 05/11/22  Yes Mickie Bail,  NP  UNABLE TO FIND daily. Med Name: Probiotic   Yes [provider]  aspirin EC 81 MG tablet Take 81 mg by mouth daily.    [provider]  latanoprost (XALATAN) 0.005 % ophthalmic solution Place 1 drop into the left eye. 08/07/16   [provider]  metoprolol tartrate (LOPRESSOR) 25 MG tablet Take 1 tablet (25 mg total) by mouth 2 (two) times daily. 01/27/22 01/27/23  Chesley Noon, MD  Multiple Vitamin (MULTIVITAMIN) capsule Take 1 capsule by mouth daily.    [provider]  pravastatin (PRAVACHOL) 10 MG tablet Take 1 tablet (10 mg total) by mouth daily. 08/17/21   Dale Hickman, MD    Family History Family History  Problem Relation Age of Onset   Acute myelogenous leukemia Sister    Polycythemia Sister 26   Emphysema Father        smoker   Heart disease Father        myocardial infarction - 36   COPD Father    Heart attack Father    Breast cancer Paternal Aunt    Testicular cancer Brother    Heart disease Mother    Emphysema Mother    COPD Mother    Asthma Mother    Heart attack Mother    Leukemia Sister    Colon cancer Neg Hx     Social History Social History   Tobacco Use   Smoking status: Former    Packs/day: 2.50    Years: 10.00    Additional pack years: 0.00    Total pack years: 25.00    Types: Cigarettes    Quit date: 03/28/1977    Years since quitting: 45.1   Smokeless tobacco: Never  Substance Use Topics   Alcohol use: No    Alcohol/week: 0.0 standard drinks of alcohol   Drug use: No     Allergies   Codeine   Review of Systems Review of Systems  Constitutional:  Negative for chills and fever.  HENT:  Positive for congestion, postnasal drip and voice change. Negative for ear pain and sore throat.   Respiratory:  Positive for cough. Negative for shortness of breath.   Cardiovascular:  Negative for chest pain and palpitations.  All other systems reviewed and are negative.    Physical Exam Triage Vital  Signs ED Triage Vitals [05/11/22 0842]  Enc Vitals Group     BP      Pulse Rate 80     Resp 18     Temp 98.1 F (36.7 C)     Temp src      SpO2 96 %  Weight      Height      Head Circumference      Peak Flow      Pain Score      Pain Loc      Pain Edu?      Excl. in GC?    No data found.  Updated Vital Signs BP 116/73   Pulse 80   Temp 98.1 F (36.7 C)   Resp 18   SpO2 96%   Visual Acuity Right Eye Distance:   Left Eye Distance:   Bilateral Distance:    Right Eye Near:   Left Eye Near:    Bilateral Near:     Physical Exam Vitals and nursing note reviewed.  Constitutional:      General: She is not in acute distress.    Appearance: Normal appearance. She is well-developed. She is not ill-appearing.  HENT:     Right Ear: Tympanic membrane normal.     Left Ear: Tympanic membrane normal.     Nose: Congestion and rhinorrhea present.     Mouth/Throat:     Mouth: Mucous membranes are moist.     Pharynx: Oropharynx is clear.  Cardiovascular:     Rate and Rhythm: Normal rate and regular rhythm.     Heart sounds: Normal heart sounds.  Pulmonary:     Effort: Pulmonary effort is normal. No respiratory distress.     Breath sounds: Normal breath sounds.     Comments: Frequent cough during exam. Musculoskeletal:     Cervical back: Neck supple.  Skin:    General: Skin is warm and dry.  Neurological:     Mental Status: She is alert.  Psychiatric:        Mood and Affect: Mood normal.        Behavior: Behavior normal.      UC Treatments / Results  Labs (all labs ordered are listed, but only abnormal results are displayed) Labs Reviewed - No data to display  EKG   Radiology No results found.  Procedures Procedures (including critical care time)  Medications Ordered in UC Medications - No data to display  Initial Impression / Assessment and Plan / UC Course  I have reviewed the triage vital signs and the nursing notes.  Pertinent labs & imaging  results that were available during my care of the patient were reviewed by me and considered in my medical decision making (see chart for details).    Acute sinusitis and bronchitis.  Afebrile, VSS.  No respiratory distress, O2 sat 96%.  Patient has only been symptomatic for 4 days but her symptoms are getting worse.  She is concerned because she has a cardiac ablation scheduled in couple of weeks.  Treating today with Zithromax.  Instructed her to follow-up with her PCP if her symptoms are not improving.  She agrees to plan of care.  Final Clinical Impressions(s) / UC Diagnoses   Final diagnoses:  Acute non-recurrent maxillary sinusitis  Acute bronchitis, unspecified organism     Discharge Instructions      Follow up with your primary care provider if your symptoms are not improving.        ED Prescriptions     Medication Sig Dispense Auth. Provider   azithromycin (ZITHROMAX) 250 MG tablet Take 1 tablet (250 mg total) by mouth daily. Take first 2 tablets together, then 1 every day until finished. 6 tablet Mickie Bail, NP      PDMP not reviewed this encounter.  Mickie Bail, NP 05/11/22 [phone]

## 2022-05-11 NOTE — ED Triage Notes (Addendum)
Patient to Urgent Care with complaints of seasonal allergies. Reports last Sunday after church her symptoms worsened last Sunday: including hoarseness/ loss of voice/ barking and productive cough/ nasal congestion/ generalized body aches. Denies any known fevers.   Taking zyrtec. States she can't take a decongestant d/t SVT. Also taking promethazine-DM cough syrup/ cough drops. Has been resting over the last five days. Reports concerns due to upcoming cardiac ablation.

## 2022-05-11 NOTE — Discharge Instructions (Addendum)
Follow up with your primary care provider if your symptoms are not improving.     

## 2022-05-15 ENCOUNTER — Encounter: Payer: Self-pay | Admitting: *Deleted

## 2022-05-15 NOTE — Telephone Encounter (Signed)
Pt called back and I read the message to her and she stated no one has contacted her for an neurology appointment

## 2022-05-15 NOTE — Telephone Encounter (Signed)
-----   Message from Dale Cottonwood, MD sent at 05/15/2022  9:13 AM EDT ----- Please call and notify - carotid ultrasound - ok.  No significant plaque/blockage.  Please confirm with her that she has been schedule an appt with neurology.  I spoke to Dr Sherryll Burger.  He had seen her previously.  New referral not needed.

## 2022-05-18 NOTE — Telephone Encounter (Signed)
See result note.  

## 2022-05-19 ENCOUNTER — Ambulatory Visit (INDEPENDENT_AMBULATORY_CARE_PROVIDER_SITE_OTHER): Payer: Medicare HMO

## 2022-05-19 VITALS — Wt 130.0 lb

## 2022-05-19 DIAGNOSIS — Z0000 Encounter for general adult medical examination without abnormal findings: Secondary | ICD-10-CM

## 2022-05-19 NOTE — Patient Instructions (Signed)
[name] , Thank you for taking time to come for your Medicare Wellness Visit. I appreciate your ongoing commitment to your health goals. Please review the following plan we discussed and let me know if I can assist you in the future.   These are the goals we discussed:  Goals       Patient Stated     Maintain Weight (pt-stated)      Stay active. Stay hydrated. Healthy diet.      Other     Patient Stated      To work more in garden work on tomatoes         This is a Horticulturist, commercial of the screening recommended for you and due dates:  Health Maintenance  Topic Date Due   COVID-19 Vaccine (4 - 2023-24 season) 05/19/2022*   Flu Shot  08/17/2022   Mammogram  09/03/2022   Medicare Annual Wellness Visit  05/19/2023   DTaP/Tdap/Td vaccine (2 - Td or Tdap) 02/23/2025   Colon Cancer Screening  09/12/2026   Pneumonia Vaccine  Completed   DEXA scan (bone density measurement)  Completed   Hepatitis C Screening: USPSTF Recommendation to screen - Ages 3-79 yo.  Completed   Zoster (Shingles) Vaccine  Completed   HPV Vaccine  Aged Out  *Topic was postponed. The date shown is not the original due date.    Advanced directives:Please bring a copy of your health care power of attorney and living will to the office to be added to your chart at your convenience.   Conditions/risks identified: Aim for 30 minutes of exercise or brisk walking, 6-8 glasses of water, and 5 servings of fruits and vegetables each day.   Next appointment: Follow up in one year for your annual wellness visit    Preventive Care 65 Years and Older, Female Preventive care refers to lifestyle choices and visits with your health care provider that can promote health and wellness. What does preventive care include? A yearly physical exam. This is also called an annual well check. Dental exams once or twice a year. Routine eye exams. Ask your health care provider how often you should have your eyes checked. Personal  lifestyle choices, including: Daily care of your teeth and gums. Regular physical activity. Eating a healthy diet. Avoiding tobacco and drug use. Limiting alcohol use. Practicing safe sex. Taking low-dose aspirin every day. Taking vitamin and mineral supplements as recommended by your health care provider. What happens during an annual well check? The services and screenings done by your health care provider during your annual well check will depend on your age, overall health, lifestyle risk factors, and family history of disease. Counseling  Your health care provider may ask you questions about your: Alcohol use. Tobacco use. Drug use. Emotional well-being. Home and relationship well-being. Sexual activity. Eating habits. History of falls. Memory and ability to understand (cognition). Work and work Astronomer. Reproductive health. Screening  You may have the following tests or measurements: Height, weight, and BMI. Blood pressure. Lipid and cholesterol levels. These may be checked every 5 years, or more frequently if you are over 28 years old. Skin check. Lung cancer screening. You may have this screening every year starting at age 45 if you have a 30-pack-year history of smoking and currently smoke or have quit within the past 15 years. Fecal occult blood test (FOBT) of the stool. You may have this test every year starting at age 44. Flexible sigmoidoscopy or colonoscopy. You may have a sigmoidoscopy  every 5 years or a colonoscopy every 10 years starting at age 8. Hepatitis C blood test. Hepatitis B blood test. Sexually transmitted disease (STD) testing. Diabetes screening. This is done by checking your blood sugar (glucose) after you have not eaten for a while (fasting). You may have this done every 1-3 years. Bone density scan. This is done to screen for osteoporosis. You may have this done starting at age 62. Mammogram. This may be done every 1-2 years. Talk to your  health care provider about how often you should have regular mammograms. Talk with your health care provider about your test results, treatment options, and if necessary, the need for more tests. Vaccines  Your health care provider may recommend certain vaccines, such as: Influenza vaccine. This is recommended every year. Tetanus, diphtheria, and acellular pertussis (Tdap, Td) vaccine. You may need a Td booster every 10 years. Zoster vaccine. You may need this after age 73. Pneumococcal 13-valent conjugate (PCV13) vaccine. One dose is recommended after age 19. Pneumococcal polysaccharide (PPSV23) vaccine. One dose is recommended after age 72. Talk to your health care provider about which screenings and vaccines you need and how often you need them. This information is not intended to replace advice given to you by your health care provider. Make sure you discuss any questions you have with your health care provider. Document Released: 01/29/2015 Document Revised: 09/22/2015 Document Reviewed: 11/03/2014 Elsevier Interactive Patient Education  2017 [place] Prevention in the Home Falls can cause injuries. They can happen to people of all ages. There are many things you can do to make your home safe and to help prevent falls. What can I do on the outside of my home? Regularly fix the edges of walkways and driveways and fix any cracks. Remove anything that might make you trip as you walk through a door, such as a raised step or threshold. Trim any bushes or trees on the path to your home. Use bright outdoor lighting. Clear any walking paths of anything that might make someone trip, such as rocks or tools. Regularly check to see if handrails are loose or broken. Make sure that both sides of any steps have handrails. Any raised decks and porches should have guardrails on the edges. Have any leaves, snow, or ice cleared regularly. Use sand or salt on walking paths during winter. Clean  up any spills in your garage right away. This includes oil or grease spills. What can I do in the bathroom? Use night lights. Install grab bars by the toilet and in the tub and shower. Do not use towel bars as grab bars. Use non-skid mats or decals in the tub or shower. If you need to sit down in the shower, use a plastic, non-slip stool. Keep the floor dry. Clean up any water that spills on the floor as soon as it happens. Remove soap buildup in the tub or shower regularly. Attach bath mats securely with double-sided non-slip rug tape. Do not have throw rugs and other things on the floor that can make you trip. What can I do in the bedroom? Use night lights. Make sure that you have a light by your bed that is easy to reach. Do not use any sheets or blankets that are too big for your bed. They should not hang down onto the floor. Have a firm chair that has side arms. You can use this for support while you get dressed. Do not have throw rugs and other things  on the floor that can make you trip. What can I do in the kitchen? Clean up any spills right away. Avoid walking on wet floors. Keep items that you use a lot in easy-to-reach places. If you need to reach something above you, use a strong step stool that has a grab bar. Keep electrical cords out of the way. Do not use floor polish or wax that makes floors slippery. If you must use wax, use non-skid floor wax. Do not have throw rugs and other things on the floor that can make you trip. What can I do with my stairs? Do not leave any items on the stairs. Make sure that there are handrails on both sides of the stairs and use them. Fix handrails that are broken or loose. Make sure that handrails are as long as the stairways. Check any carpeting to make sure that it is firmly attached to the stairs. Fix any carpet that is loose or worn. Avoid having throw rugs at the top or bottom of the stairs. If you do have throw rugs, attach them to the  floor with carpet tape. Make sure that you have a light switch at the top of the stairs and the bottom of the stairs. If you do not have them, ask someone to add them for you. What else can I do to help prevent falls? Wear shoes that: Do not have high heels. Have rubber bottoms. Are comfortable and fit you well. Are closed at the toe. Do not wear sandals. If you use a stepladder: Make sure that it is fully opened. Do not climb a closed stepladder. Make sure that both sides of the stepladder are locked into place. Ask someone to hold it for you, if possible. Clearly mark and make sure that you can see: Any grab bars or handrails. First and last steps. Where the edge of each step is. Use tools that help you move around (mobility aids) if they are needed. These include: Canes. Walkers. Scooters. Crutches. Turn on the lights when you go into a dark area. Replace any light bulbs as soon as they burn out. Set up your furniture so you have a clear path. Avoid moving your furniture around. If any of your floors are uneven, fix them. If there are any pets around you, be aware of where they are. Review your medicines with your doctor. Some medicines can make you feel dizzy. This can increase your chance of falling. Ask your doctor what other things that you can do to help prevent falls. This information is not intended to replace advice given to you by your health care provider. Make sure you discuss any questions you have with your health care provider. Document Released: 10/29/2008 Document Revised: 06/10/2015 Document Reviewed: 02/06/2014 Elsevier Interactive Patient Education  2017 [employer].

## 2022-05-19 NOTE — Progress Notes (Signed)
Subjective:   Dawn Wolfe is a 77 y.o. female who presents for Medicare Annual (Subsequent) preventive examination.  Review of Systems    I connected with  Dawn Wolfe on 05/19/22 by a audio enabled telemedicine application and verified that I am speaking with the correct person using two identifiers.  Patient Location: Home  Provider Location: Home Office  I discussed the limitations of evaluation and management by telemedicine. The patient expressed understanding and agreed to proceed.  Cardiac Risk Factors include: advanced age (>65men, >57 women)     Objective:    Today's Vitals   05/19/22 1303  Weight: 130 lb (59 kg)   Body mass index is 20.98 kg/m.     05/19/2022    1:11 PM 05/11/2022    8:48 AM 01/27/2022   10:22 AM 05/06/2021    3:31 PM 05/05/2020   10:43 AM 05/02/2019   10:38 AM 05/01/2018   10:02 AM  Advanced Directives  Does Patient Have a Medical Advance Directive? Yes No No Yes Yes Yes Yes  Type of Horticulturist, commercial will Healthcare Power of [place];Living will Healthcare Power of [employer] of [place];Living will  Does patient want to make changes to medical advance directive?    No - Patient declined No - Patient declined No - Patient declined No - Patient declined  Copy of Healthcare Power of Attorney in Chart?     No - copy requested No - copy requested No - copy requested    Current Medications (verified) Outpatient Encounter Medications as of 05/19/2022  Medication Sig   aspirin EC 81 MG tablet Take 81 mg by mouth daily.   latanoprost (XALATAN) 0.005 % ophthalmic solution Place 1 drop into the left eye.   metoprolol tartrate (LOPRESSOR) 25 MG tablet Take 1 tablet (25 mg total) by mouth 2 (two) times daily.   pravastatin (PRAVACHOL) 10 MG tablet Take 1 tablet (10 mg total) by mouth daily.   UNABLE TO FIND daily. Med Name: Probiotic   [DISCONTINUED] azithromycin (ZITHROMAX) 250 MG tablet Take  1 tablet (250 mg total) by mouth daily. Take first 2 tablets together, then 1 every day until finished.   [DISCONTINUED] Multiple Vitamin (MULTIVITAMIN) capsule Take 1 capsule by mouth daily.   No facility-administered encounter medications on file as of 05/19/2022.    Allergies (verified) Codeine   History: Past Medical History:  Diagnosis Date   Anemia    h/o as a child   Arthritis    left shoulder   Cancer (HCC) 15 yrs ago   [employer] on the face. Surgical removal   Complication of anesthesia    Hemorrhoids    History of colonoscopy 2003   Normal, Dr. Sherin Quarry, [place]    Hypercholesterolemia    Normal exercise sestamibi stress test 2008   Ken Fath   Obstructive sleep apnea on CPAP 2007   PONV (postoperative nausea and vomiting)    Rotator cuff tear, left    Sleep apnea    Past Surgical History:  Procedure Laterality Date   ABDOMINALPLASTY W/ LIPOSUCTION  AGE 88   BUNIONECTOMY  X3  LAST ONE 2003   CHOLECYSTECTOMY N/A 04/19/2015   Procedure: LAPAROSCOPIC CHOLECYSTECTOMY WITH INTRAOPERATIVE CHOLANGIOGRAM;  Surgeon: Earline Mayotte, MD;  Location: [facility] ORS;  Service: General;  Laterality: N/A;   COLONOSCOPY  2012   Dr Robin Searing in [place]   COLONOSCOPY WITH PROPOFOL N/A 09/11/2016   Procedure: COLONOSCOPY WITH PROPOFOL;  Surgeon: Christena Deem, MD;  Location: [facility] ENDOSCOPY;  Service: Endoscopy;  Laterality: N/A;   ERCP N/A 04/29/2015   Procedure: ENDOSCOPIC RETROGRADE CHOLANGIOPANCREATOGRAPHY (ERCP);  Surgeon: Wallace Cullens, MD;  Location: [facility] ENDOSCOPY;  Service: Gastroenterology;  Laterality: N/A;   EYE SURGERY     cataract extraction   NASAL SEPTUM SURGERY  AGE 62   NASAL SINUS SURGERY  AGE 48   SHOULDER ARTHROSCOPY  10/25/2011   Procedure: ARTHROSCOPY SHOULDER;  Surgeon: Drucilla Schmidt, MD;  Location: [facility];  Service: Orthopedics;  Laterality: Left;  with SAD, shave of the labrium    TUBAL LIGATION  1982   Family History  Problem  Relation Age of Onset   Acute myelogenous leukemia Sister    Polycythemia Sister 30   Emphysema Father        smoker   Heart disease Father        myocardial infarction - 48   COPD Father    Heart attack Father    Breast cancer Paternal Aunt    Testicular cancer Brother    Heart disease Mother    Emphysema Mother    COPD Mother    Asthma Mother    Heart attack Mother    Leukemia Sister    Colon cancer Neg Hx    Social History   Socioeconomic History   Marital status: Married    Spouse name: Not on file   Number of children: Not on file   Years of education: Not on file   Highest education level: Not on file  Occupational History   Not on file  Tobacco Use   Smoking status: Former    Packs/day: 2.50    Years: 10.00    Additional pack years: 0.00    Total pack years: 25.00    Types: Cigarettes    Quit date: 03/28/1977    Years since quitting: 45.1   Smokeless tobacco: Never  Substance and Sexual Activity   Alcohol use: No    Alcohol/week: 0.0 standard drinks of alcohol   Drug use: No   Sexual activity: Not Currently    Birth control/protection: Post-menopausal  Other Topics Concern   Not on file  Social History Narrative   Not on file   Social Determinants of Health   Financial Resource Strain: Low Risk  (05/19/2022)   Overall Financial Resource Strain (CARDIA)    Difficulty of Paying Living Expenses: Not hard at all  Food Insecurity: No Food Insecurity (05/19/2022)   Hunger Vital Sign    Worried About Running Out of Food in the Last Year: Never true    Ran Out of Food in the Last Year: Never true  Transportation Needs: No Transportation Needs (05/06/2021)   PRAPARE - Administrator, Civil Service (Medical): No    Lack of Transportation (Non-Medical): No  Physical Activity: Insufficiently Active (05/19/2022)   Exercise Vital Sign    Days of Exercise per Week: 7 days    Minutes of Exercise per Session: 20 min  Stress: No Stress Concern Present  (05/19/2022)   [employer] of Occupational Health - Occupational Stress Questionnaire    Feeling of Stress : Not at all  Social Connections: Socially Integrated (05/19/2022)   Social Connection and Isolation Panel [NHANES]    Frequency of Communication with Friends and Family: More than three times a week    Frequency of Social Gatherings with Friends and Family: More than three times a week  Attends Religious Services: More than 4 times per year    Active Member of Clubs or Organizations: Yes    Attends Banker Meetings: More than 4 times per year    Marital Status: Married    Tobacco Counseling Counseling given: Yes   Clinical Intake:  Pre-visit preparation completed: Yes  Pain : No/denies pain     BMI - recorded: 20.98 Nutritional Status: BMI 25 -29 Overweight Nutritional Risks: None Diabetes: No  How often do you need to have someone help you when you read instructions, pamphlets, or other written materials from your doctor or pharmacy?: 1 - Never  Diabetic?no  Interpreter Needed?: No  Information entered by :: Fredirick Maudlin   Activities of Daily Living    05/19/2022    1:13 PM 05/19/2022    9:40 AM  In your present state of health, do you have any difficulty performing the following activities:  Hearing? 0 0  Vision? 0 0  Difficulty concentrating or making decisions? 0 0  Walking or climbing stairs? 0 0  Dressing or bathing? 0 0  Doing errands, shopping? 0 0  Preparing Food and eating ? Y N  Using the Toilet? Y N  In the past six months, have you accidently leaked urine? Y N  Do you have problems with loss of bowel control? Y   Managing your Medications? Y N  Managing your Finances? Y N  Housekeeping or managing your Housekeeping? [place]    Patient Care Team: Dale Alta, MD as PCP - General (Internal Medicine) Dale Tselakai Dezza, MD (Internal Medicine) Lemar Livings Merrily Pew, MD (General Surgery)  Indicate any recent Medical  Services you may have received from other than [facility] providers in the past year (date may be approximate).     Assessment:   This is a routine wellness examination for Cristan.  Hearing/Vision screen Hearing Screening - Comments:: Denies hearing difficulties   Vision Screening - Comments:: Wears rx glasses - up to date with routine eye exams with  Dr.Bell  Dietary issues and exercise activities discussed: Current Exercise Habits: Home exercise routine, Type of exercise: treadmill, Time (Minutes): 45, Frequency (Times/Week): 5, Weekly Exercise (Minutes/Week): 225, Intensity: Mild, Exercise limited by: None identified   Goals Addressed               This Visit's Progress     Patient Stated     Maintain Weight (pt-stated)   On track     Stay active. Stay hydrated. Healthy diet.      Other     Patient Stated        To work more in garden work on tomatoes       Depression Screen    05/19/2022    1:08 PM 02/22/2022   12:30 PM 12/20/2021    8:24 AM 05/06/2021    3:29 PM 11/10/2020   10:22 AM 05/05/2020   10:44 AM 05/02/2019   10:49 AM  PHQ 2/9 Scores  PHQ - 2 Score 0 0 0 0 0 0 0    Fall Risk    05/19/2022    9:40 AM 02/22/2022   12:30 PM 12/20/2021    8:24 AM 05/06/2021    3:37 PM 11/10/2020   10:22 AM  Fall Risk   Falls in the past year? 0 0 0 0 0  Number falls in past yr: 0 0 0 0 0  Injury with Fall? 0 0 0  0  Risk for fall due to :  No Fall Risks No Fall Risks  No Fall Risks  Follow up  Falls evaluation completed Falls evaluation completed Falls evaluation completed Falls evaluation completed    FALL RISK PREVENTION PERTAINING TO THE HOME:  Any stairs in or around the home? No  If so, are there any without handrails? No  Home free of loose throw rugs in walkways, pet beds, electrical cords, etc? No  Adequate lighting in your home to reduce risk of falls? No   ASSISTIVE DEVICES UTILIZED TO PREVENT FALLS:  Life alert? No  Use of a cane, walker or w/c? No  Grab  bars in the bathroom? Yes  Shower chair or bench in shower? Yes  Elevated toilet seat or a handicapped toilet? No   TIMED UP AND GO:  Was the test performed? No televisit.   Cognitive Function:    11/26/2014    9:32 AM  MMSE - Mini Mental State Exam  Orientation to time 5  Orientation to Place 5  Registration 3  Attention/ Calculation 5  Recall 3  Language- name 2 objects 2  Language- repeat 1  Language- follow 3 step command 3  Language- read & follow direction 1  Write a sentence 1  Copy design 1  Total score 30        05/19/2022    1:09 PM 05/02/2019   10:50 AM 05/01/2018   10:56 AM  6CIT Screen  What Year? 0 points 0 points 0 points  What month? 0 points 0 points 0 points  What time? 0 points  0 points  Count back from 20 0 points 0 points 0 points  Months in reverse 0 points  0 points  Repeat phrase 0 points  0 points  Total Score 0 points  0 points    Immunizations Immunization History  Administered Date(s) Administered   Fluad Quad(high Dose 65+) 09/19/2019, 10/28/2020   Influenza Split 10/26/2010, 10/16/2011, 10/21/2012, 08/22/2013   Influenza, High Dose Seasonal PF 10/18/2016, 10/14/2018, 10/17/2021   Influenza-Unspecified 10/08/2014, 10/20/2015, 10/24/2017   PFIZER(Purple Top)SARS-COV-2 Vaccination 03/02/2019, 04/01/2019, 11/13/2019   Pneumococcal Conjugate-13 11/26/2014   Pneumococcal Polysaccharide-23 12/07/2015   Tdap 02/24/2015   Zoster Recombinat (Shingrix) 03/08/2020, 07/20/2020   Zoster, Live 04/10/2013    TDAP status: Up to date  Flu Vaccine status: Up to date  Pneumococcal vaccine status: Up to date  Covid-19 vaccine status: Completed vaccines  Qualifies for Shingles Vaccine? Yes   Zostavax completed Yes   Shingrix Completed?: Yes  Screening Tests Health Maintenance  Topic Date Due   COVID-19 Vaccine (4 - 2023-24 season) 05/19/2022 (Originally 09/16/2021)   INFLUENZA VACCINE  08/17/2022   MAMMOGRAM  09/03/2022   Medicare Annual  Wellness (AWV)  05/19/2023   DTaP/Tdap/Td (2 - Td or Tdap) 02/23/2025   COLONOSCOPY (Pts 45-96yrs Insurance coverage will need to be confirmed)  09/12/2026   Pneumonia Vaccine 77+ Years old  Completed   DEXA SCAN  Completed   Hepatitis C Screening  Completed   Zoster Vaccines- Shingrix  Completed   HPV VACCINES  Aged Out    Health Maintenance  There are no preventive care reminders to display for this patient.   Colorectal cancer screening: No longer required.   Mammogram status: Completed 09/02/2021. Repeat every year 1 year   Bone Density status: Completed 01/29/2014. Results reflect: Bone density results: NORMAL. Repeat every 5 years.  Lung Cancer Screening: (Low Dose CT Chest recommended if Age 49-80 years, 30 pack-year currently smoking OR have quit w/in 15years.) does  not qualify.      Additional Screening:  Hepatitis C Screening: does not qualify;   Vision Screening: Recommended annual ophthalmology exams for early detection of glaucoma and other disorders of the eye. Is the patient up to date with their annual eye exam?  Yes  Who is the provider or what is the name of the office in which the patient attends annual eye exams? Dr Alvester Morin If pt is not established with a provider, would they like to be referred to a provider to establish care? No .   Dental Screening: Recommended annual dental exams for proper oral hygiene  Community Resource Referral / Chronic Care Management: CRR required this visit?  No   CCM required this visit?  No      Plan:     I have personally reviewed and noted the following in the patient's chart:   Medical and social history Use of alcohol, tobacco or illicit drugs  Current medications and supplements including opioid prescriptions. Patient is not currently taking opioid prescriptions. Functional ability and status Nutritional status Physical activity Advanced directives List of other physicians Hospitalizations, surgeries, and  ER visits in previous 12 months Vitals Screenings to include cognitive, depression, and falls Referrals and appointments  In addition, I have reviewed and discussed with patient certain preventive protocols, quality metrics, and best practice recommendations. A written personalized care plan for preventive services as well as general preventive health recommendations were provided to patient.     Annabell Sabal, CMA   05/19/2022   Nurse Notes: None

## 2022-05-24 DIAGNOSIS — D225 Melanocytic nevi of trunk: Secondary | ICD-10-CM | POA: Diagnosis not present

## 2022-05-24 DIAGNOSIS — L300 Nummular dermatitis: Secondary | ICD-10-CM | POA: Diagnosis not present

## 2022-05-24 DIAGNOSIS — Z85828 Personal history of other malignant neoplasm of skin: Secondary | ICD-10-CM | POA: Diagnosis not present

## 2022-05-24 DIAGNOSIS — Z01818 Encounter for other preprocedural examination: Secondary | ICD-10-CM | POA: Diagnosis not present

## 2022-05-24 DIAGNOSIS — D2272 Melanocytic nevi of left lower limb, including hip: Secondary | ICD-10-CM | POA: Diagnosis not present

## 2022-05-24 DIAGNOSIS — D2261 Melanocytic nevi of right upper limb, including shoulder: Secondary | ICD-10-CM | POA: Diagnosis not present

## 2022-05-24 DIAGNOSIS — D2262 Melanocytic nevi of left upper limb, including shoulder: Secondary | ICD-10-CM | POA: Diagnosis not present

## 2022-05-31 DIAGNOSIS — G4733 Obstructive sleep apnea (adult) (pediatric): Secondary | ICD-10-CM | POA: Diagnosis not present

## 2022-05-31 DIAGNOSIS — Z7982 Long term (current) use of aspirin: Secondary | ICD-10-CM | POA: Diagnosis not present

## 2022-05-31 DIAGNOSIS — Z79899 Other long term (current) drug therapy: Secondary | ICD-10-CM | POA: Diagnosis not present

## 2022-05-31 DIAGNOSIS — Z87891 Personal history of nicotine dependence: Secondary | ICD-10-CM | POA: Diagnosis not present

## 2022-05-31 DIAGNOSIS — I4719 Other supraventricular tachycardia: Secondary | ICD-10-CM | POA: Diagnosis not present

## 2022-05-31 DIAGNOSIS — I471 Supraventricular tachycardia, unspecified: Secondary | ICD-10-CM | POA: Diagnosis not present

## 2022-06-01 DIAGNOSIS — Z87891 Personal history of nicotine dependence: Secondary | ICD-10-CM | POA: Diagnosis not present

## 2022-06-01 DIAGNOSIS — Z8679 Personal history of other diseases of the circulatory system: Secondary | ICD-10-CM | POA: Insufficient documentation

## 2022-06-01 DIAGNOSIS — Z79899 Other long term (current) drug therapy: Secondary | ICD-10-CM | POA: Diagnosis not present

## 2022-06-01 DIAGNOSIS — I4719 Other supraventricular tachycardia: Secondary | ICD-10-CM | POA: Diagnosis not present

## 2022-06-01 DIAGNOSIS — Z7982 Long term (current) use of aspirin: Secondary | ICD-10-CM | POA: Diagnosis not present

## 2022-06-01 DIAGNOSIS — G4733 Obstructive sleep apnea (adult) (pediatric): Secondary | ICD-10-CM | POA: Diagnosis not present

## 2022-06-01 DIAGNOSIS — I471 Supraventricular tachycardia, unspecified: Secondary | ICD-10-CM | POA: Diagnosis not present

## 2022-06-22 DIAGNOSIS — I5032 Chronic diastolic (congestive) heart failure: Secondary | ICD-10-CM | POA: Diagnosis not present

## 2022-06-22 DIAGNOSIS — Z8679 Personal history of other diseases of the circulatory system: Secondary | ICD-10-CM | POA: Diagnosis not present

## 2022-06-22 DIAGNOSIS — G4733 Obstructive sleep apnea (adult) (pediatric): Secondary | ICD-10-CM | POA: Diagnosis not present

## 2022-06-22 DIAGNOSIS — I471 Supraventricular tachycardia, unspecified: Secondary | ICD-10-CM | POA: Diagnosis not present

## 2022-06-22 DIAGNOSIS — E7800 Pure hypercholesterolemia, unspecified: Secondary | ICD-10-CM | POA: Diagnosis not present

## 2022-06-22 DIAGNOSIS — Z9889 Other specified postprocedural states: Secondary | ICD-10-CM | POA: Diagnosis not present

## 2022-06-23 ENCOUNTER — Encounter: Payer: Self-pay | Admitting: Internal Medicine

## 2022-06-23 ENCOUNTER — Ambulatory Visit (INDEPENDENT_AMBULATORY_CARE_PROVIDER_SITE_OTHER): Payer: Medicare HMO | Admitting: Internal Medicine

## 2022-06-23 VITALS — BP 124/76 | HR 73 | Temp 98.0°F | Resp 16 | Ht 65.0 in | Wt 130.6 lb

## 2022-06-23 DIAGNOSIS — R748 Abnormal levels of other serum enzymes: Secondary | ICD-10-CM

## 2022-06-23 DIAGNOSIS — I471 Supraventricular tachycardia, unspecified: Secondary | ICD-10-CM | POA: Diagnosis not present

## 2022-06-23 DIAGNOSIS — I720 Aneurysm of carotid artery: Secondary | ICD-10-CM

## 2022-06-23 DIAGNOSIS — R911 Solitary pulmonary nodule: Secondary | ICD-10-CM | POA: Diagnosis not present

## 2022-06-23 DIAGNOSIS — Z1231 Encounter for screening mammogram for malignant neoplasm of breast: Secondary | ICD-10-CM | POA: Diagnosis not present

## 2022-06-23 DIAGNOSIS — R479 Unspecified speech disturbances: Secondary | ICD-10-CM

## 2022-06-23 DIAGNOSIS — Z860100 Personal history of colon polyps, unspecified: Secondary | ICD-10-CM

## 2022-06-23 DIAGNOSIS — D649 Anemia, unspecified: Secondary | ICD-10-CM | POA: Insufficient documentation

## 2022-06-23 DIAGNOSIS — G4733 Obstructive sleep apnea (adult) (pediatric): Secondary | ICD-10-CM

## 2022-06-23 DIAGNOSIS — R739 Hyperglycemia, unspecified: Secondary | ICD-10-CM | POA: Diagnosis not present

## 2022-06-23 DIAGNOSIS — E7800 Pure hypercholesterolemia, unspecified: Secondary | ICD-10-CM | POA: Diagnosis not present

## 2022-06-23 LAB — CBC WITH DIFFERENTIAL/PLATELET
Basophils Absolute: 0.1 10*3/uL (ref 0.0–0.1)
Basophils Relative: 1.2 % (ref 0.0–3.0)
Eosinophils Absolute: 0.2 10*3/uL (ref 0.0–0.7)
Eosinophils Relative: 3.5 % (ref 0.0–5.0)
HCT: 38 % (ref 36.0–46.0)
Hemoglobin: 12.8 g/dL (ref 12.0–15.0)
Lymphocytes Relative: 30.6 % (ref 12.0–46.0)
Lymphs Abs: 1.9 10*3/uL (ref 0.7–4.0)
MCHC: 33.6 g/dL (ref 30.0–36.0)
MCV: 87.5 fl (ref 78.0–100.0)
Monocytes Absolute: 0.4 10*3/uL (ref 0.1–1.0)
Monocytes Relative: 6.3 % (ref 3.0–12.0)
Neutro Abs: 3.6 10*3/uL (ref 1.4–7.7)
Neutrophils Relative %: 58.4 % (ref 43.0–77.0)
Platelets: 233 10*3/uL (ref 150.0–400.0)
RBC: 4.35 Mil/uL (ref 3.87–5.11)
RDW: 13.9 % (ref 11.5–15.5)
WBC: 6.2 10*3/uL (ref 4.0–10.5)

## 2022-06-23 LAB — BASIC METABOLIC PANEL
BUN: 16 mg/dL (ref 6–23)
CO2: 26 mEq/L (ref 19–32)
Calcium: 9.7 mg/dL (ref 8.4–10.5)
Chloride: 103 mEq/L (ref 96–112)
Creatinine, Ser: 0.93 mg/dL (ref 0.40–1.20)
GFR: 60.03 mL/min (ref >60.00)
Glucose, Bld: 104 mg/dL — ABNORMAL HIGH (ref 70–99)
Potassium: 4.3 mEq/L (ref 3.5–5.1)
Sodium: 137 mEq/L (ref 135–145)

## 2022-06-23 LAB — IBC + FERRITIN
Ferritin: 23.6 ng/mL (ref 10.0–291.0)
Iron: 98 ug/dL (ref 42–145)
Saturation Ratios: 26 % (ref 20.0–50.0)
TIBC: 376.6 ug/dL (ref 250.0–450.0)
Transferrin: 269 mg/dL (ref 212.0–360.0)

## 2022-06-23 LAB — HEPATIC FUNCTION PANEL
ALT: 14 U/L (ref 0–35)
AST: 23 U/L (ref 0–37)
Albumin: 4.6 g/dL (ref 3.5–5.2)
Alkaline Phosphatase: 56 U/L (ref 39–117)
Bilirubin, Direct: 0.2 mg/dL (ref 0.0–0.3)
Total Bilirubin: 0.7 mg/dL (ref 0.2–1.2)
Total Protein: 7.8 g/dL (ref 6.0–8.3)

## 2022-06-23 LAB — LIPID PANEL
Cholesterol: 159 mg/dL (ref 0–200)
HDL: 49.8 mg/dL (ref >39.00)
LDL Cholesterol: 84 mg/dL (ref 0–99)
NonHDL: 108.95
Total CHOL/HDL Ratio: 3
Triglycerides: 127 mg/dL (ref 0.0–149.0)
VLDL: 25.4 mg/dL (ref 0.0–40.0)

## 2022-06-23 LAB — VITAMIN B12: Vitamin B-12: 190 pg/mL — ABNORMAL LOW (ref 211–911)

## 2022-06-23 LAB — HEMOGLOBIN A1C: Hgb A1c MFr Bld: 5.6 % (ref 4.6–6.5)

## 2022-06-23 MED ORDER — PRAVASTATIN SODIUM 10 MG PO TABS: 10.0000 mg | ORAL_TABLET | Freq: Every day | ORAL | 3 refills | Status: DC

## 2022-06-23 NOTE — Assessment & Plan Note (Signed)
Colonoscopy 08/2016.  Had referred back to GI. Keep f/u with neurology and will address after w/up/treatment complete.

## 2022-06-23 NOTE — Assessment & Plan Note (Signed)
Recent hgb decreased.  Recheck today.  Also check b12 and iron studies.

## 2022-06-23 NOTE — Assessment & Plan Note (Signed)
Continues to watch diet.  Follow met b and a1c.   Lab Results  Component Value Date   HGBA1C 5.6 06/23/2022

## 2022-06-23 NOTE — Assessment & Plan Note (Signed)
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  Off metoprolol. Doing well since ablation.  Feels good.

## 2022-06-23 NOTE — Assessment & Plan Note (Signed)
Previous abdominal ultrasound - changes c/w fatty liver.  Follow liver function tests.   

## 2022-06-23 NOTE — Assessment & Plan Note (Signed)
Saw pulmonary 09/2021 -repeat CT of the chest in 3 months for surveillance. CT 01/19/22 - recommended 6 month f/u.  Scheduled for 07/2022. Also,  MRI of the thoracic spine given the increase in FDG avidity on the PET.  Unable to do the MRI thoracic spine - due to anxiety.  Tried benzo prior.  Unable to complete.  Wants to hold on further scanning.  Follow.

## 2022-06-23 NOTE — Assessment & Plan Note (Signed)
Had the recent episode as outlined in previous note. Knew what she wanted to say. Could not get the word out. Raised concern - expressive aphasia with associated left hand tingling. Question of TIA.  No weakness.  The episode lasted 5-7 minutes.  No residual problems.  No recurrence.  On aspirin daily.  Given that she was scheduled for ablation, she was continued on aspirin.  Hel on further anticoagulation.  Had CT head - no acute abnormality. Carotid ultrasound - no signifcant blockage. She declined MRI.  Continue cpap for sleep apnea. Explained to her if symptoms recur, change, she is to be evaluated immediately.  Discussed neurology f/u.  Has appt scheduled. Has had no recurring episodes.

## 2022-06-23 NOTE — Progress Notes (Signed)
Subjective:    Patient ID: Dawn Wolfe, female    DOB: [birth year], 77 y.o.   MRN: [id number]  Patient here for  Chief Complaint  Patient presents with   Medical Management of Chronic Issues    HPI Here for follow up.  Recently evaluated after noticing difficulty getting words out.  See last note for details. Question of TIA.  Continues on aspirin.  Is scheduled to see neurology.  Has not had recurring episode.  Is s/p ablation for paroxysmal SVT (05/31/22).  Doing well sinc ablation.  Feels better.  No increased heart rate or palpitations.  No chest pain reported.  Breathing stable. Off metoprolol. Had f/u with Dr Juliann Pares yesterday.  No changes. Evaluated 05/11/22 - diagnosed with sinusitis/bronchitis.  Treated with zpak.  Scheduled for f/u CT chest 07/2022 - f/u pulmonary nodules.   Past Medical History:  Diagnosis Date   Anemia    h/o as a child   Arthritis    left shoulder   Cancer (HCC) 15 yrs ago   [employer] on the face. Surgical removal   Complication of anesthesia    Hemorrhoids    History of colonoscopy 2003   Normal, Dr. Sherin Quarry, [place]    Hypercholesterolemia    Normal exercise sestamibi stress test 2008   Ken Fath   Obstructive sleep apnea on CPAP 2007   PONV (postoperative nausea and vomiting)    Rotator cuff tear, left    Sleep apnea    Past Surgical History:  Procedure Laterality Date   ABDOMINALPLASTY W/ LIPOSUCTION  AGE 9   BUNIONECTOMY  X3  LAST ONE 2003   CHOLECYSTECTOMY N/A 04/19/2015   Procedure: LAPAROSCOPIC CHOLECYSTECTOMY WITH INTRAOPERATIVE CHOLANGIOGRAM;  Surgeon: Earline Mayotte, MD;  Location: [facility] ORS;  Service: General;  Laterality: N/A;   COLONOSCOPY  2012   Dr Robin Searing in [place]   COLONOSCOPY WITH PROPOFOL N/A 09/11/2016   Procedure: COLONOSCOPY WITH PROPOFOL;  Surgeon: Christena Deem, MD;  Location: [facility] ENDOSCOPY;  Service: Endoscopy;  Laterality: N/A;   ERCP N/A 04/29/2015   Procedure: ENDOSCOPIC RETROGRADE  CHOLANGIOPANCREATOGRAPHY (ERCP);  Surgeon: Wallace Cullens, MD;  Location: [facility] ENDOSCOPY;  Service: Gastroenterology;  Laterality: N/A;   EYE SURGERY     cataract extraction   NASAL SEPTUM SURGERY  AGE 48   NASAL SINUS SURGERY  AGE 36   SHOULDER ARTHROSCOPY  10/25/2011   Procedure: ARTHROSCOPY SHOULDER;  Surgeon: Drucilla Schmidt, MD;  Location: [facility];  Service: Orthopedics;  Laterality: Left;  with SAD, shave of the labrium    TUBAL LIGATION  1982   Family History  Problem Relation Age of Onset   Acute myelogenous leukemia Sister    Polycythemia Sister 42   Emphysema Father        smoker   Heart disease Father        myocardial infarction - 69   COPD Father    Heart attack Father    Breast cancer Paternal Aunt    Testicular cancer Brother    Heart disease Mother    Emphysema Mother    COPD Mother    Asthma Mother    Heart attack Mother    Leukemia Sister    Colon cancer Neg Hx    Social History   Socioeconomic History   Marital status: Married    Spouse name: Not on file   Number of children: Not on file   Years of education: Not on file  Highest education level: Not on file  Occupational History   Not on file  Tobacco Use   Smoking status: Former    Packs/day: 2.50    Years: 10.00    Additional pack years: 0.00    Total pack years: 25.00    Types: Cigarettes    Quit date: 03/28/1977    Years since quitting: 45.2   Smokeless tobacco: Never  Substance and Sexual Activity   Alcohol use: No    Alcohol/week: 0.0 standard drinks of alcohol   Drug use: No   Sexual activity: Not Currently    Birth control/protection: Post-menopausal  Other Topics Concern   Not on file  Social History Narrative   Not on file   Social Determinants of Health   Financial Resource Strain: Low Risk  (05/19/2022)   Overall Financial Resource Strain (CARDIA)    Difficulty of Paying Living Expenses: Not hard at all  Food Insecurity: No Food Insecurity (05/19/2022)    Hunger Vital Sign    Worried About Running Out of Food in the Last Year: Never true    Ran Out of Food in the Last Year: Never true  Transportation Needs: No Transportation Needs (05/06/2021)   PRAPARE - Administrator, Civil Service (Medical): No    Lack of Transportation (Non-Medical): No  Physical Activity: Insufficiently Active (05/19/2022)   Exercise Vital Sign    Days of Exercise per Week: 7 days    Minutes of Exercise per Session: 20 min  Stress: No Stress Concern Present (05/19/2022)   [employer] of Occupational Health - Occupational Stress Questionnaire    Feeling of Stress : Not at all  Social Connections: Socially Integrated (05/19/2022)   Social Connection and Isolation Panel [NHANES]    Frequency of Communication with Friends and Family: More than three times a week    Frequency of Social Gatherings with Friends and Family: More than three times a week    Attends Religious Services: More than 4 times per year    Active Member of [employer] or Organizations: Yes    Attends Engineer, structural: More than 4 times per year    Marital Status: Married     Review of Systems  Constitutional:  Negative for appetite change and unexpected weight change.  HENT:  Negative for congestion and sinus pressure.   Respiratory:  Negative for cough, chest tightness and shortness of breath.   Cardiovascular:  Negative for chest pain, palpitations and leg swelling.  Gastrointestinal:  Negative for abdominal pain, diarrhea, nausea and vomiting.  Genitourinary:  Negative for difficulty urinating and dysuria.  Musculoskeletal:  Negative for joint swelling and myalgias.  Skin:  Negative for color change and rash.  Neurological:  Negative for dizziness, light-headedness and headaches.  Psychiatric/Behavioral:  Negative for agitation and dysphoric mood.        Objective:     BP 124/76   Pulse 73   Temp 98 F (36.7 C)   Resp 16   Ht 5\' 5"  (1.651 m)   Wt 130 lb 9.6 oz  (59.2 kg)   SpO2 98%   BMI 21.73 kg/m  Wt Readings from Last 3 Encounters:  06/23/22 130 lb 9.6 oz (59.2 kg)  05/19/22 130 lb (59 kg)  05/03/22 130 lb 12.8 oz (59.3 kg)    Physical Exam Vitals reviewed.  Constitutional:      General: She is not in acute distress.    Appearance: Normal appearance.  HENT:     Head:  Normocephalic and atraumatic.     Right Ear: External ear normal.     Left Ear: External ear normal.  Eyes:     General: No scleral icterus.       Right eye: No discharge.        Left eye: No discharge.     Conjunctiva/sclera: Conjunctivae normal.  Neck:     Thyroid: No thyromegaly.  Cardiovascular:     Rate and Rhythm: Normal rate and regular rhythm.  Pulmonary:     Effort: No respiratory distress.     Breath sounds: Normal breath sounds. No wheezing.  Abdominal:     General: Bowel sounds are normal.     Palpations: Abdomen is soft.     Tenderness: There is no abdominal tenderness.  Musculoskeletal:        General: No swelling or tenderness.     Cervical back: Neck supple. No tenderness.  Lymphadenopathy:     Cervical: No cervical adenopathy.  Skin:    Findings: No erythema or rash.  Neurological:     Mental Status: She is alert.  Psychiatric:        Mood and Affect: Mood normal.        Behavior: Behavior normal.      Outpatient Encounter Medications as of 06/23/2022  Medication Sig   aspirin EC 81 MG tablet Take 81 mg by mouth daily.   latanoprost (XALATAN) 0.005 % ophthalmic solution Place 1 drop into the left eye.   pravastatin (PRAVACHOL) 10 MG tablet Take 1 tablet (10 mg total) by mouth daily.   saccharomyces boulardii (FLORASTOR) 250 MG capsule Take by mouth.   UNABLE TO FIND daily. Med Name: Probiotic   [DISCONTINUED] metoprolol tartrate (LOPRESSOR) 25 MG tablet Take 1 tablet (25 mg total) by mouth 2 (two) times daily.   [DISCONTINUED] pravastatin (PRAVACHOL) 10 MG tablet Take 1 tablet (10 mg total) by mouth daily.   No facility-administered  encounter medications on file as of 06/23/2022.     Lab Results  Component Value Date   WBC 6.2 06/23/2022   HGB 12.8 06/23/2022   HCT 38.0 06/23/2022   PLT 233.0 06/23/2022   GLUCOSE 104 (H) 06/23/2022   CHOL 159 06/23/2022   TRIG 127.0 06/23/2022   HDL 49.80 06/23/2022   LDLDIRECT 70.0 02/22/2022   LDLCALC 84 06/23/2022   ALT 14 06/23/2022   AST 23 06/23/2022   NA 137 06/23/2022   K 4.3 06/23/2022   CL 103 06/23/2022   CREATININE 0.93 06/23/2022   BUN 16 06/23/2022   CO2 26 06/23/2022   TSH 2.05 02/22/2022   HGBA1C 5.6 06/23/2022    VAS US CAROTID  Result Date: 05/15/2022 Carotid Arterial Duplex Study Patient Name:  MERLINDA MCCLEAN  Date of Exam:   05/11/2022 Medical Rec #: [id number]           Accession #:    [id number] Date of Birth: [birth year]           Patient Gender: F Patient Age:   50 years Exam Location:  [facility] Procedure:      VAS US CAROTID Referring Phys: Westley Hummer Shamara Soza --------------------------------------------------------------------------------  Indications:   Speech disturbance and Visual disturbance. Risk Factors:  Hyperlipidemia, past history of smoking. Other Factors: Transient of expressive aphasia and tingling in the left hand. Performing Technologist: Hardie Lora RVT  Examination Guidelines: A complete evaluation includes B-mode imaging, spectral Doppler, color Doppler, and power Doppler as needed of all accessible portions of each vessel.  Bilateral testing is considered an integral part of a complete examination. Limited examinations for reoccurring indications may be performed as noted.  Right Carotid Findings: +----------+--------+--------+--------+------------------+--------+           PSV cm/sEDV cm/sStenosisPlaque DescriptionComments +----------+--------+--------+--------+------------------+--------+ CCA Prox  90      19                                          +----------+--------+--------+--------+------------------+--------+ CCA Mid   90      23                                         +----------+--------+--------+--------+------------------+--------+ CCA Distal70      14                                         +----------+--------+--------+--------+------------------+--------+ ICA Prox  90      30      Normal                             +----------+--------+--------+--------+------------------+--------+ ICA Mid   100     36                                         +----------+--------+--------+--------+------------------+--------+ ICA Distal114     40                                         +----------+--------+--------+--------+------------------+--------+ ECA       131     19              irregular                  +----------+--------+--------+--------+------------------+--------+ +----------+--------+-------+----------------+-------------------+           PSV cm/sEDV cmsDescribe        Arm Pressure (mmHG) +----------+--------+-------+----------------+-------------------+ Subclavian101            Multiphasic, WNL                    +----------+--------+-------+----------------+-------------------+ +---------+--------+--+--------+--+---------+ VertebralPSV cm/s87EDV cm/s23Antegrade +---------+--------+--+--------+--+---------+  Left Carotid Findings: +----------+--------+--------+--------+------------------+--------+           PSV cm/sEDV cm/sStenosisPlaque DescriptionComments +----------+--------+--------+--------+------------------+--------+ CCA Prox  91      20                                         +----------+--------+--------+--------+------------------+--------+ CCA Mid   99      24                                         +----------+--------+--------+--------+------------------+--------+ CCA Distal87      21                                          +----------+--------+--------+--------+------------------+--------+  ICA Prox  92      22      Normal                             +----------+--------+--------+--------+------------------+--------+ ICA Mid   86      35                                         +----------+--------+--------+--------+------------------+--------+ ICA Distal112     38                                         +----------+--------+--------+--------+------------------+--------+ ECA       98      14                                         +----------+--------+--------+--------+------------------+--------+ +----------+--------+--------+----------------+-------------------+           PSV cm/sEDV cm/sDescribe        Arm Pressure (mmHG) +----------+--------+--------+----------------+-------------------+ [id number]             Multiphasic, WNL                    +----------+--------+--------+----------------+-------------------+ +---------+--------+--+--------+--+---------+ VertebralPSV cm/s50EDV cm/s16Antegrade +---------+--------+--+--------+--+---------+   Summary: Right Carotid: There was no evidence of thrombus, dissection, atherosclerotic                plaque or stenosis in the cervical carotid system. Left Carotid: There was no evidence of thrombus, dissection, atherosclerotic               plaque or stenosis in the cervical carotid system. Vertebrals:  Bilateral vertebral arteries demonstrate antegrade flow. Subclavians: Normal flow hemodynamics were seen in bilateral subclavian              arteries. *See table(s) above for measurements and observations.  Electronically signed by Levora Dredge MD on 05/15/2022 at 8:48:13 AM.    Final        Assessment & Plan:  Hypercholesteremia Assessment & Plan: On pravastatin.  Low cholesterol diet and exercise.  Follow lipid panel and liver function tests.   Lab Results  Component Value Date   CHOL 159 06/23/2022   HDL 49.80 06/23/2022    LDLCALC 84 06/23/2022   LDLDIRECT 70.0 02/22/2022   TRIG 127.0 06/23/2022   CHOLHDL 3 06/23/2022    Orders: -     Lipid panel -     Hepatic function panel -     Basic metabolic panel  Hyperglycemia Assessment & Plan: Continues to watch diet.  Follow met b and a1c.   Lab Results  Component Value Date   HGBA1C 5.6 06/23/2022    Orders: -     Hemoglobin A1c  Visit for screening mammogram -     3D Screening Mammogram, Left and Right; Future  Anemia, unspecified type Assessment & Plan: Recent hgb decreased.  Recheck today.  Also check b12 and iron studies.   Orders: -     CBC with Differential/Platelet -     Vitamin B12 -     IBC + Ferritin  Abnormal liver enzymes Assessment & Plan: Previous abdominal ultrasound - changes c/w fatty  liver.  Follow liver function tests.     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  Just had f/u MRA (08/2021). Stable. Has f/u with neurology scheduled.   Difficulty with speech Assessment & Plan: Had the recent episode as outlined in previous note. Knew what she wanted to say. Could not get the word out. Raised concern - expressive aphasia with associated left hand tingling. Question of TIA.  No weakness.  The episode lasted 5-7 minutes.  No residual problems.  No recurrence.  On aspirin daily.  Given that she was scheduled for ablation, she was continued on aspirin.  Hel on further anticoagulation.  Had CT head - no acute abnormality. Carotid ultrasound - no signifcant blockage. She declined MRI.  Continue cpap for sleep apnea. Explained to her if symptoms recur, change, she is to be evaluated immediately.  Discussed neurology f/u.  Has appt scheduled. Has had no recurring episodes.    Obstructive sleep apnea Assessment & Plan: Continue cpap.    Pulmonary nodule Assessment & Plan: Saw pulmonary 09/2021 -repeat CT of the chest in 3 months for surveillance. CT 01/19/22 -  recommended 6 month f/u.  Scheduled for 07/2022. Also,  MRI of the thoracic spine given the increase in FDG avidity on the PET.  Unable to do the MRI thoracic spine - due to anxiety.  Tried benzo prior.  Unable to complete.  Wants to hold on further scanning.  Follow.    SVT (supraventricular tachycardia) Assessment & Plan: S/p catheter ablation (SVT) 05/31/22.  Off metoprolol. Doing well since ablation.  Feels good.    History of colon polyps Assessment & Plan: Colonoscopy 08/2016.  Had referred back to GI. Keep f/u with neurology and will address after w/up/treatment complete.    Other orders -     Pravastatin Sodium; Take 1 tablet (10 mg total) by mouth daily.  Dispense: 90 tablet; Refill: 3     Dale Eagle, MD

## 2022-06-23 NOTE — Assessment & Plan Note (Signed)
Continue cpap.  

## 2022-06-23 NOTE — Assessment & Plan Note (Signed)
Reviewed with neurology - recommended f/u MRA in one year.  Just had f/u MRA (08/2021). Stable. Has f/u with neurology scheduled.

## 2022-06-23 NOTE — Assessment & Plan Note (Signed)
On pravastatin.  Low cholesterol diet and exercise.  Follow lipid panel and liver function tests.   Lab Results  Component Value Date   CHOL 159 06/23/2022   HDL 49.80 06/23/2022   LDLCALC 84 06/23/2022   LDLDIRECT 70.0 02/22/2022   TRIG 127.0 06/23/2022   CHOLHDL 3 06/23/2022

## 2022-06-26 ENCOUNTER — Other Ambulatory Visit: Payer: Self-pay

## 2022-06-26 MED ORDER — PRAVASTATIN SODIUM 20 MG PO TABS: 20.0000 mg | ORAL_TABLET | Freq: Every day | ORAL | 3 refills | Status: DC

## 2022-07-01 DIAGNOSIS — G4733 Obstructive sleep apnea (adult) (pediatric): Secondary | ICD-10-CM | POA: Diagnosis not present

## 2022-07-03 ENCOUNTER — Ambulatory Visit (INDEPENDENT_AMBULATORY_CARE_PROVIDER_SITE_OTHER): Payer: Medicare HMO

## 2022-07-03 DIAGNOSIS — E538 Deficiency of other specified B group vitamins: Secondary | ICD-10-CM

## 2022-07-03 MED ORDER — CYANOCOBALAMIN 1000 MCG/ML IJ SOLN
1000.0000 ug | Freq: Once | INTRAMUSCULAR | Status: AC
Start: 2022-07-03 — End: 2022-07-03
  Administered 2022-07-03: 1000 ug via INTRAMUSCULAR

## 2022-07-03 NOTE — Progress Notes (Signed)
Pt presented for their vitamin B12 injection. Pt was identified through two identifiers. Pt tolerated shot well in their left  deltoid.  

## 2022-07-04 DIAGNOSIS — I720 Aneurysm of carotid artery: Secondary | ICD-10-CM | POA: Diagnosis not present

## 2022-07-04 DIAGNOSIS — I471 Supraventricular tachycardia, unspecified: Secondary | ICD-10-CM | POA: Diagnosis not present

## 2022-07-04 DIAGNOSIS — R55 Syncope and collapse: Secondary | ICD-10-CM | POA: Diagnosis not present

## 2022-07-04 DIAGNOSIS — G4733 Obstructive sleep apnea (adult) (pediatric): Secondary | ICD-10-CM | POA: Diagnosis not present

## 2022-07-07 DIAGNOSIS — G4733 Obstructive sleep apnea (adult) (pediatric): Secondary | ICD-10-CM | POA: Diagnosis not present

## 2022-07-10 ENCOUNTER — Ambulatory Visit (INDEPENDENT_AMBULATORY_CARE_PROVIDER_SITE_OTHER): Payer: Medicare HMO

## 2022-07-10 DIAGNOSIS — H524 Presbyopia: Secondary | ICD-10-CM | POA: Diagnosis not present

## 2022-07-10 DIAGNOSIS — H40153 Residual stage of open-angle glaucoma, bilateral: Secondary | ICD-10-CM | POA: Diagnosis not present

## 2022-07-10 DIAGNOSIS — E538 Deficiency of other specified B group vitamins: Secondary | ICD-10-CM

## 2022-07-10 MED ORDER — CYANOCOBALAMIN 1000 MCG/ML IJ SOLN
1000.0000 ug | Freq: Once | INTRAMUSCULAR | Status: AC
  Administered 2022-07-10: 1000 ug via INTRAMUSCULAR

## 2022-07-10 NOTE — Progress Notes (Signed)
After obtaining consent, and per orders of  Margaret Arnett, NP, injection of B-12 given IM in right deltoid by Maximilian Tallo Lynn. Patient tolerated injection well.  

## 2022-07-17 ENCOUNTER — Ambulatory Visit (INDEPENDENT_AMBULATORY_CARE_PROVIDER_SITE_OTHER): Payer: Medicare HMO

## 2022-07-17 DIAGNOSIS — E538 Deficiency of other specified B group vitamins: Secondary | ICD-10-CM

## 2022-07-17 MED ORDER — CYANOCOBALAMIN 1000 MCG/ML IJ SOLN
1000.0000 ug | Freq: Once | INTRAMUSCULAR | Status: AC
Start: 2022-07-17 — End: 2022-07-17
  Administered 2022-07-17: 1000 ug via INTRAMUSCULAR

## 2022-07-17 NOTE — Progress Notes (Signed)
Pt presented for their vitamin B12 injection. Pt was identified through two identifiers. Pt tolerated shot well in their right deltoid.  

## 2022-07-24 ENCOUNTER — Ambulatory Visit
Admission: RE | Admit: 2022-07-24 | Discharge: 2022-07-24 | Disposition: A | Discharge status: Home / Self Care | Payer: Medicare HMO | Source: Ambulatory Visit | Attending: Internal Medicine | Admitting: Internal Medicine

## 2022-07-24 ENCOUNTER — Ambulatory Visit (INDEPENDENT_AMBULATORY_CARE_PROVIDER_SITE_OTHER): Payer: Medicare HMO

## 2022-07-24 DIAGNOSIS — J479 Bronchiectasis, uncomplicated: Secondary | ICD-10-CM | POA: Diagnosis not present

## 2022-07-24 DIAGNOSIS — J984 Other disorders of lung: Secondary | ICD-10-CM | POA: Diagnosis not present

## 2022-07-24 DIAGNOSIS — R918 Other nonspecific abnormal finding of lung field: Secondary | ICD-10-CM | POA: Diagnosis not present

## 2022-07-24 DIAGNOSIS — R911 Solitary pulmonary nodule: Secondary | ICD-10-CM | POA: Diagnosis not present

## 2022-07-24 DIAGNOSIS — E538 Deficiency of other specified B group vitamins: Secondary | ICD-10-CM | POA: Diagnosis not present

## 2022-07-24 MED ORDER — CYANOCOBALAMIN 1000 MCG/ML IJ SOLN
1000.00 ug | Freq: Once | INTRAMUSCULAR | Status: AC
Start: 2022-07-24 — End: 2022-07-24
  Administered 2022-07-24: 1000 ug via INTRAMUSCULAR

## 2022-07-24 NOTE — Progress Notes (Signed)
Pt presented for their vitamin B12 injection. Pt was identified through two identifiers. Pt tolerated shot well in their left  deltoid.  

## 2022-07-28 ENCOUNTER — Ambulatory Visit: Payer: Medicare HMO | Admitting: Student in an Organized Health Care Education/Training Program

## 2022-07-28 ENCOUNTER — Encounter: Payer: Self-pay | Admitting: Student in an Organized Health Care Education/Training Program

## 2022-07-28 VITALS — BP 110/60 | HR 86 | Temp 97.8°F | Ht 65.0 in | Wt 133.0 lb

## 2022-07-28 DIAGNOSIS — R911 Solitary pulmonary nodule: Secondary | ICD-10-CM

## 2022-07-28 NOTE — Progress Notes (Unsigned)
Synopsis: Referred in for pulmonary nodule by Dale [place], MD  Assessment & Plan:   1. Pulmonary nodule 1 cm or greater in diameter  Presenting with a 10 mm LLL solid nodule and similarly a ground glass nodule in the RLL. The nodule is not spiculated, with no associated lymphadenopathy and is not PET avid on CT. The LLL nodule exhibits 1.4% risk of being malignant based on the [facility] SPN risk score calculator. She is ECOG 0. Both nodules have been stable on repeat chest CT on 07/24/2022. Additionally, the repeat CT in July shows mild tree-in-bud nodularity in the RUL that is likely infectious or inflammatory in origin.  I had discussed the diagnosis of lung malignancy and approach to pulmonary nodules with Dawn Wolfe during our many visits, and she would like to continue with radiographic monitoring rather than have a biopsy. We will continue with radiographic surveillance of the images and I will get a repeat CT in 6 months. Should this one be stable, will switch to yearly CT's.   - CT CHEST WO CONTRAST; Future   Return in about 6 months (around 01/28/2023).  I spent 30 minutes caring for this patient today, including preparing to see the patient, obtaining a medical history , reviewing a separately obtained history, performing a medically appropriate examination and/or evaluation, counseling and educating the patient/family/caregiver, ordering medications, tests, or procedures, documenting clinical information in the electronic health record, and independently interpreting results (not separately reported/billed) and communicating results to the patient/family/caregiver  Raechel Chute, MD [facility] 07/28/2022 9:35 AM    End of visit medications:  No orders of the defined types were placed in this encounter.    Current Outpatient Medications:    aspirin EC 81 MG tablet, Take 81 mg by mouth daily., Disp: , Rfl:    Cyanocobalamin (VITAMIN B-12) 1000  MCG/15ML LIQD, Take by mouth. 1 injection monthly, Disp: , Rfl:    latanoprost (XALATAN) 0.005 % ophthalmic solution, Place 1 drop into the left eye., Disp: , Rfl: 3   Multiple Vitamin (MULTIVITAMIN) tablet, Take 1 tablet by mouth daily., Disp: , Rfl:    pravastatin (PRAVACHOL) 20 MG tablet, Take 1 tablet (20 mg total) by mouth daily., Disp: 90 tablet, Rfl: 3   saccharomyces boulardii (FLORASTOR) 250 MG capsule, Take by mouth., Disp: , Rfl:    UNABLE TO FIND, daily. Med Name: Probiotic (Patient not taking: Reported on 07/28/2022), Disp: , Rfl:    Subjective:   PATIENT ID: Dawn Wolfe GENDER: female DOB: [birth year], MRN: [id number]  Chief Complaint  Patient presents with   Follow-up    Nodule    HPI  Dawn Wolfe is a pleasant 77 year old female presenting for follow up. She is here to discuss the result of the follow up CT.   Patient remains in good health and spirit. She has no symptoms. She underwent ablation for SVT in May at [facility] and feels well overall. No respiratory symptoms are reported. She has no shortness of breath, cough, chest pain, wheeze, or chest tightness.   She get a coronary calcium screen 09/06/2021 which was noted to have a solid pulmonary nodule in the left lower lobe and a groundglass nodule in the right lower lobe. We had ordered a dedicated CT of the chest and a PET/CT and patient decided to proceed with radiographic monitoring of the nodules rather than go for biopsy. She is here to discuss interval CT. She does have a history of sleep  apnea and sees Rubye Oaks, NP for it.  She reports compliance with her CPAP and that it has made her sleep and energy levels a lot better.  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  She is a non-smoker and does not drink.  Ancillary information including prior medications, full medical/surgical/family/social histories, and PFTs (when available) are listed below and have been reviewed.   Review  of Systems  Constitutional:  Negative for chills, fever and weight loss.  Respiratory:  Negative for cough, hemoptysis, sputum production, shortness of breath and wheezing.   Cardiovascular:  Negative for chest pain and orthopnea.  Gastrointestinal:  Negative for heartburn.  Skin:  Negative for rash.     Objective:   Vitals:   07/28/22 0918  BP: 110/60  Pulse: 86  Temp: 97.8 F (36.6 C)  SpO2: 97%  Weight: 133 lb (60.3 kg)  Height: 5\' 5"  (1.651 m)   97% on RA  BMI Readings from Last 3 Encounters:  07/28/22 22.13 kg/m  06/23/22 21.73 kg/m  05/19/22 20.98 kg/m   Wt Readings from Last 3 Encounters:  07/28/22 133 lb (60.3 kg)  06/23/22 130 lb 9.6 oz (59.2 kg)  05/19/22 130 lb (59 kg)    Physical Exam Constitutional:      Appearance: Normal appearance.  HENT:     Head: Normocephalic.     Mouth/Throat:     Mouth: Mucous membranes are moist.  Cardiovascular:     Rate and Rhythm: Normal rate and regular rhythm.     Heart sounds: Normal heart sounds.  Pulmonary:     Effort: Pulmonary effort is normal.     Breath sounds: Normal breath sounds.  Musculoskeletal:        General: Normal range of motion.     Cervical back: Normal range of motion.  Skin:    General: Skin is warm.  Neurological:     General: No focal deficit present.     Mental Status: She is alert and oriented to person, place, and time.       Ancillary Information    Past Medical History:  Diagnosis Date   Anemia    h/o as a child   Arthritis    left shoulder   Cancer (HCC) 15 yrs ago   [employer] on the face. Surgical removal   Complication of anesthesia    Hemorrhoids    History of colonoscopy 2003   Normal, Dr. Sherin Quarry, [place]    Hypercholesterolemia    Normal exercise sestamibi stress test 2008   Ken Fath   Obstructive sleep apnea on CPAP 2007   PONV (postoperative nausea and vomiting)    Rotator cuff tear, left    Sleep apnea      Family History  Problem Relation Age of  Onset   Acute myelogenous leukemia Sister    Polycythemia Sister 7   Emphysema Father        smoker   Heart disease Father        myocardial infarction - 27   COPD Father    Heart attack Father    Breast cancer Paternal Aunt    Testicular cancer Brother    Heart disease Mother    Emphysema Mother    COPD Mother    Asthma Mother    Heart attack Mother    Leukemia Sister    Colon cancer Neg Hx      Past Surgical History:  Procedure Laterality Date   ABDOMINALPLASTY  W/ LIPOSUCTION  AGE 37   BUNIONECTOMY  X3  LAST ONE 2003   CHOLECYSTECTOMY N/A 04/19/2015   Procedure: LAPAROSCOPIC CHOLECYSTECTOMY WITH INTRAOPERATIVE CHOLANGIOGRAM;  Surgeon: Earline Mayotte, MD;  Location: [facility] ORS;  Service: General;  Laterality: N/A;   COLONOSCOPY  2012   Dr Robin Searing in [place]   COLONOSCOPY WITH PROPOFOL N/A 09/11/2016   Procedure: COLONOSCOPY WITH PROPOFOL;  Surgeon: Christena Deem, MD;  Location: [facility] ENDOSCOPY;  Service: Endoscopy;  Laterality: N/A;   ERCP N/A 04/29/2015   Procedure: ENDOSCOPIC RETROGRADE CHOLANGIOPANCREATOGRAPHY (ERCP);  Surgeon: Wallace Cullens, MD;  Location: [facility] ENDOSCOPY;  Service: Gastroenterology;  Laterality: N/A;   EYE SURGERY     cataract extraction   NASAL SEPTUM SURGERY  AGE 18   NASAL SINUS SURGERY  AGE 23   SHOULDER ARTHROSCOPY  10/25/2011   Procedure: ARTHROSCOPY SHOULDER;  Surgeon: Drucilla Schmidt, MD;  Location: [facility];  Service: Orthopedics;  Laterality: Left;  with SAD, shave of the labrium    TUBAL LIGATION  1982    Social History   Socioeconomic History   Marital status: Married    Spouse name: Not on file   Number of children: Not on file   Years of education: Not on file   Highest education level: Not on file  Occupational History   Not on file  Tobacco Use   Smoking status: Former    Current packs/day: 0.00    Average packs/day: 2.5 packs/day for 10.0 years (25.0 ttl pk-yrs)    Types: Cigarettes    Start date:  03/29/1967    Quit date: 03/28/1977    Years since quitting: 45.3   Smokeless tobacco: Never  Substance and Sexual Activity   Alcohol use: No    Alcohol/week: 0.0 standard drinks of alcohol   Drug use: No   Sexual activity: Not Currently    Birth control/protection: Post-menopausal  Other Topics Concern   Not on file  Social History Narrative   Not on file   Social Determinants of Health   Financial Resource Strain: Low Risk  (05/19/2022)   Overall Financial Resource Strain (CARDIA)    Difficulty of Paying Living Expenses: Not hard at all  Food Insecurity: No Food Insecurity (05/19/2022)   Hunger Vital Sign    Worried About Running Out of Food in the Last Year: Never true    Ran Out of Food in the Last Year: Never true  Transportation Needs: No Transportation Needs (05/06/2021)   PRAPARE - Administrator, Civil Service (Medical): No    Lack of Transportation (Non-Medical): No  Physical Activity: Insufficiently Active (05/19/2022)   Exercise Vital Sign    Days of Exercise per Week: 7 days    Minutes of Exercise per Session: 20 min  Stress: No Stress Concern Present (05/19/2022)   [employer] of Occupational Health - Occupational Stress Questionnaire    Feeling of Stress : Not at all  Social Connections: Socially Integrated (05/19/2022)   Social Connection and Isolation Panel [NHANES]    Frequency of Communication with Friends and Family: More than three times a week    Frequency of Social Gatherings with Friends and Family: More than three times a week    Attends Religious Services: More than 4 times per year    Active Member of [employer] or Organizations: Yes    Attends Engineer, structural: More than 4 times per year    Marital Status: Married  Catering manager Violence:  Not At Risk (05/19/2022)   Humiliation, Afraid, Rape, and Kick questionnaire    Fear of Current or Ex-Partner: No    Emotionally Abused: No    Physically Abused: No    Sexually Abused: No      Allergies  Allergen Reactions   Codeine Nausea And Vomiting     CBC    Component Value Date/Time   WBC 6.2 06/23/2022 1058   RBC 4.35 06/23/2022 1058   HGB 12.8 06/23/2022 1058   HCT 38.0 06/23/2022 1058   PLT 233.0 06/23/2022 1058   MCV 87.5 06/23/2022 1058   MCH 29.3 01/27/2022 1022   MCHC 33.6 06/23/2022 1058   RDW 13.9 06/23/2022 1058   LYMPHSABS 1.9 06/23/2022 1058   MONOABS 0.4 06/23/2022 1058   EOSABS 0.2 06/23/2022 1058   BASOSABS 0.1 06/23/2022 1058    Pulmonary Functions Testing Results:     No data to display          Outpatient Medications Prior to Visit  Medication Sig Dispense Refill   aspirin EC 81 MG tablet Take 81 mg by mouth daily.     Cyanocobalamin (VITAMIN B-12) 1000 MCG/15ML LIQD Take by mouth. 1 injection monthly     latanoprost (XALATAN) 0.005 % ophthalmic solution Place 1 drop into the left eye.  3   Multiple Vitamin (MULTIVITAMIN) tablet Take 1 tablet by mouth daily.     pravastatin (PRAVACHOL) 20 MG tablet Take 1 tablet (20 mg total) by mouth daily. 90 tablet 3   saccharomyces boulardii (FLORASTOR) 250 MG capsule Take by mouth.     UNABLE TO FIND daily. Med Name: Probiotic (Patient not taking: Reported on 07/28/2022)     No facility-administered medications prior to visit.

## 2022-07-31 DIAGNOSIS — G4733 Obstructive sleep apnea (adult) (pediatric): Secondary | ICD-10-CM | POA: Diagnosis not present

## 2022-08-10 DIAGNOSIS — Z8249 Family history of ischemic heart disease and other diseases of the circulatory system: Secondary | ICD-10-CM | POA: Diagnosis not present

## 2022-08-10 DIAGNOSIS — Z885 Allergy status to narcotic agent status: Secondary | ICD-10-CM | POA: Diagnosis not present

## 2022-08-10 DIAGNOSIS — E785 Hyperlipidemia, unspecified: Secondary | ICD-10-CM | POA: Diagnosis not present

## 2022-08-10 DIAGNOSIS — I700 Atherosclerosis of aorta: Secondary | ICD-10-CM | POA: Diagnosis not present

## 2022-08-10 DIAGNOSIS — I10 Essential (primary) hypertension: Secondary | ICD-10-CM | POA: Diagnosis not present

## 2022-08-10 DIAGNOSIS — Z95820 Peripheral vascular angioplasty status with implants and grafts: Secondary | ICD-10-CM | POA: Diagnosis not present

## 2022-08-10 DIAGNOSIS — Z9181 History of falling: Secondary | ICD-10-CM | POA: Diagnosis not present

## 2022-08-10 DIAGNOSIS — Z809 Family history of malignant neoplasm, unspecified: Secondary | ICD-10-CM | POA: Diagnosis not present

## 2022-08-10 DIAGNOSIS — G4733 Obstructive sleep apnea (adult) (pediatric): Secondary | ICD-10-CM | POA: Diagnosis not present

## 2022-08-10 DIAGNOSIS — J479 Bronchiectasis, uncomplicated: Secondary | ICD-10-CM | POA: Diagnosis not present

## 2022-08-10 DIAGNOSIS — I872 Venous insufficiency (chronic) (peripheral): Secondary | ICD-10-CM | POA: Diagnosis not present

## 2022-08-10 DIAGNOSIS — M1990 Unspecified osteoarthritis, unspecified site: Secondary | ICD-10-CM | POA: Diagnosis not present

## 2022-08-14 DIAGNOSIS — Z0100 Encounter for examination of eyes and vision without abnormal findings: Secondary | ICD-10-CM | POA: Diagnosis not present

## 2022-08-18 DIAGNOSIS — Z8679 Personal history of other diseases of the circulatory system: Secondary | ICD-10-CM | POA: Diagnosis not present

## 2022-08-18 DIAGNOSIS — Z9889 Other specified postprocedural states: Secondary | ICD-10-CM | POA: Diagnosis not present

## 2022-08-18 DIAGNOSIS — Z09 Encounter for follow-up examination after completed treatment for conditions other than malignant neoplasm: Secondary | ICD-10-CM | POA: Diagnosis not present

## 2022-08-24 ENCOUNTER — Ambulatory Visit (INDEPENDENT_AMBULATORY_CARE_PROVIDER_SITE_OTHER): Payer: Medicare HMO

## 2022-08-24 DIAGNOSIS — E538 Deficiency of other specified B group vitamins: Secondary | ICD-10-CM | POA: Diagnosis not present

## 2022-08-24 MED ORDER — CYANOCOBALAMIN 1000 MCG/ML IJ SOLN
1000.0000 ug | Freq: Once | INTRAMUSCULAR | Status: AC
Start: 2022-08-24 — End: 2022-08-24
  Administered 2022-08-24: 1000 ug via INTRAMUSCULAR

## 2022-08-24 NOTE — Progress Notes (Signed)
Per Dr. Lorin Picket a B12 injection was administered into the right deltoid. Patient tolerated the injection well.

## 2022-08-31 ENCOUNTER — Encounter (INDEPENDENT_AMBULATORY_CARE_PROVIDER_SITE_OTHER): Payer: Self-pay

## 2022-09-05 ENCOUNTER — Ambulatory Visit
Admission: RE | Admit: 2022-09-05 | Discharge: 2022-09-05 | Disposition: A | Discharge status: Home / Self Care | Payer: Medicare HMO | Source: Ambulatory Visit | Attending: Internal Medicine | Admitting: Internal Medicine

## 2022-09-05 DIAGNOSIS — Z1231 Encounter for screening mammogram for malignant neoplasm of breast: Secondary | ICD-10-CM | POA: Diagnosis not present

## 2022-09-06 ENCOUNTER — Other Ambulatory Visit: Payer: Self-pay | Admitting: Internal Medicine

## 2022-09-06 DIAGNOSIS — R928 Other abnormal and inconclusive findings on diagnostic imaging of breast: Secondary | ICD-10-CM

## 2022-09-08 ENCOUNTER — Encounter: Payer: Self-pay | Admitting: *Deleted

## 2022-09-12 ENCOUNTER — Ambulatory Visit
Admission: RE | Admit: 2022-09-12 | Discharge: 2022-09-12 | Disposition: A | Discharge status: Home / Self Care | Payer: Medicare HMO | Source: Ambulatory Visit | Attending: Internal Medicine | Admitting: Internal Medicine

## 2022-09-12 DIAGNOSIS — R928 Other abnormal and inconclusive findings on diagnostic imaging of breast: Secondary | ICD-10-CM | POA: Insufficient documentation

## 2022-09-12 DIAGNOSIS — R92321 Mammographic fibroglandular density, right breast: Secondary | ICD-10-CM | POA: Diagnosis not present

## 2022-09-25 ENCOUNTER — Ambulatory Visit: Payer: Medicare HMO

## 2022-09-25 NOTE — Progress Notes (Deleted)
Pt presented for their vitamin B12 injection. Pt was identified through two identifiers. Pt tolerated shot well in their left or right deltoid.  

## 2022-09-29 ENCOUNTER — Encounter: Payer: Self-pay | Admitting: Internal Medicine

## 2022-09-29 ENCOUNTER — Ambulatory Visit (INDEPENDENT_AMBULATORY_CARE_PROVIDER_SITE_OTHER): Payer: Medicare HMO | Admitting: Internal Medicine

## 2022-09-29 VITALS — BP 122/72 | HR 87 | Temp 98.0°F | Ht 65.0 in | Wt 132.8 lb

## 2022-09-29 DIAGNOSIS — R739 Hyperglycemia, unspecified: Secondary | ICD-10-CM

## 2022-09-29 DIAGNOSIS — Z0000 Encounter for general adult medical examination without abnormal findings: Secondary | ICD-10-CM

## 2022-09-29 DIAGNOSIS — E7800 Pure hypercholesterolemia, unspecified: Secondary | ICD-10-CM

## 2022-09-29 DIAGNOSIS — R911 Solitary pulmonary nodule: Secondary | ICD-10-CM

## 2022-09-29 DIAGNOSIS — H532 Diplopia: Secondary | ICD-10-CM

## 2022-09-29 DIAGNOSIS — N281 Cyst of kidney, acquired: Secondary | ICD-10-CM

## 2022-09-29 DIAGNOSIS — I720 Aneurysm of carotid artery: Secondary | ICD-10-CM

## 2022-09-29 DIAGNOSIS — R748 Abnormal levels of other serum enzymes: Secondary | ICD-10-CM

## 2022-09-29 DIAGNOSIS — Z23 Encounter for immunization: Secondary | ICD-10-CM

## 2022-09-29 DIAGNOSIS — G4733 Obstructive sleep apnea (adult) (pediatric): Secondary | ICD-10-CM | POA: Diagnosis not present

## 2022-09-29 DIAGNOSIS — I471 Supraventricular tachycardia, unspecified: Secondary | ICD-10-CM

## 2022-09-29 NOTE — Assessment & Plan Note (Signed)
Physical today 09/29/22.  Mammogram 09/12/22 -  - Birads II.  Colonoscopy 08/2016.

## 2022-09-29 NOTE — Progress Notes (Unsigned)
Subjective:    Patient ID: Dawn Wolfe, female    DOB: 1945-12-20, 77 y.o.   MRN: [id number]  Patient here for  Chief Complaint  Patient presents with  . Annual Exam    HPI Here for a physical exam. Is s/p ablation for paroxysmal SVT (05/31/22). Doing well since ablation. Saw cardiology 08/18/22 - no recurrence.  F/u prn. Saw neurology - evaluated of recent blurred vision, etc.  Negative MRI brain and MRA.  Recommended continuing aspirin 81mg  daily and decrease pravastatin to 10mg  q day. 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  Saw Dr Aundria Rud 07/28/22 - f/u lung nodule.  Recommended f/u CT in 6 months.    Past Medical History:  Diagnosis Date  . Anemia    h/o as a child  . Arthritis    left shoulder  . Cancer (HCC) 15 yrs ago   [employer] on the face. Surgical removal  . Complication of anesthesia   . Hemorrhoids   . History of colonoscopy 2003   Normal, Dr. Sherin Quarry, [place]   . Hypercholesterolemia   . Normal exercise sestamibi stress test 2008   Ken Fath  . Obstructive sleep apnea on CPAP 2007  . PONV (postoperative nausea and vomiting)   . Rotator cuff tear, left   . Sleep apnea    Past Surgical History:  Procedure Laterality Date  . ABDOMINALPLASTY W/ LIPOSUCTION  AGE 52  . BUNIONECTOMY  X3  LAST ONE 2003  . CHOLECYSTECTOMY N/A 04/19/2015   Procedure: LAPAROSCOPIC CHOLECYSTECTOMY WITH INTRAOPERATIVE CHOLANGIOGRAM;  Surgeon: Earline Mayotte, MD;  Location: [facility] ORS;  Service: General;  Laterality: N/A;  . COLONOSCOPY  2012   Dr Robin Searing in [place]  . COLONOSCOPY WITH PROPOFOL N/A 09/11/2016   Procedure: COLONOSCOPY WITH PROPOFOL;  Surgeon: Christena Deem, MD;  Location[facility] ENDOSCOPY;  Service: Endoscopy;  Laterality: N/A;  . ERCP N/A 04/29/2015   Procedure:  ENDOSCOPIC RETROGRADE CHOLANGIOPANCREATOGRAPHY (ERCP);  Surgeon: Wallace Cullens, MD;  Location: [facility] ENDOSCOPY;  Service: Gastroenterology;  Laterality: N/A;  . EYE SURGERY     cataract extraction  . NASAL SEPTUM SURGERY  AGE 22  . NASAL SINUS SURGERY  AGE 24  . SHOULDER ARTHROSCOPY  10/25/2011   Procedure: ARTHROSCOPY SHOULDER;  Surgeon: Drucilla Schmidt, MD;  Location: [facility];  Service: Orthopedics;  Laterality: Left;  with SAD, shave of the labrium   . TUBAL LIGATION  1982   Family History  Problem Relation Age of Onset  . Acute myelogenous leukemia Sister   . Polycythemia Sister 52  . Emphysema Father        smoker  . Heart disease Father        myocardial infarction - 60  . COPD Father   . Heart attack Father   . Breast cancer Paternal Aunt   . Testicular cancer Brother   . Heart disease Mother   . Emphysema Mother   . COPD Mother   . Asthma Mother   . Heart attack Mother   . Leukemia Sister   . Colon cancer Neg Hx    Social History   Socioeconomic History  . Marital status: Married    Spouse name: Not on file  . Number of children: Not on file  . Years of education:  Not on file  . Highest education level: Not on file  Occupational History  . Not on file  Tobacco Use  . Smoking status: Former    Current packs/day: 0.00    Average packs/day: 2.5 packs/day for 10.0 years (25.0 ttl pk-yrs)    Types: Cigarettes    Start date: 03/29/1967    Quit date: 03/28/1977    Years since quitting: 45.5  . Smokeless tobacco: Never  Substance and Sexual Activity  . Alcohol use: No    Alcohol/week: 0.0 standard drinks of alcohol  . Drug use: No  . Sexual activity: Not Currently    Birth control/protection: Post-menopausal  Other Topics Concern  . Not on file  Social History Narrative  . Not on file   Social Determinants of Health   Financial Resource Strain: Low Risk  (05/19/2022)   Overall Financial Resource Strain (CARDIA)   . Difficulty of Paying Living  Expenses: Not hard at all  Food Insecurity: No Food Insecurity (05/19/2022)   Hunger Vital Sign   . Worried About Programme researcher, broadcasting/film/video in the Last Year: Never true   . Ran Out of Food in the Last Year: Never true  Transportation Needs: No Transportation Needs (05/06/2021)   PRAPARE - Transportation   . Lack of Transportation (Medical): No   . Lack of Transportation (Non-Medical): No  Physical Activity: Insufficiently Active (05/19/2022)   Exercise Vital Sign   . Days of Exercise per Week: 7 days   . Minutes of Exercise per Session: 20 min  Stress: No Stress Concern Present (05/19/2022)   [employer] of Occupational Health - Occupational Stress Questionnaire   . Feeling of Stress : Not at all  Social Connections: Socially Integrated (05/19/2022)   Social Connection and Isolation Panel [NHANES]   . Frequency of Communication with Friends and Family: More than three times a week   . Frequency of Social Gatherings with Friends and Family: More than three times a week   . Attends Religious Services: More than 4 times per year   . Active Member of Clubs or Organizations: Yes   . Attends Banker Meetings: More than 4 times per year   . Marital Status: Married     Review of Systems     Objective:     BP 122/72   Pulse 87   Temp 98 F (36.7 C) (Oral)   Ht 5\' 5"  (1.651 m)   Wt 132 lb 12.8 oz (60.2 kg)   SpO2 96%   BMI 22.10 kg/m  Wt Readings from Last 3 Encounters:  09/29/22 132 lb 12.8 oz (60.2 kg)  07/28/22 133 lb (60.3 kg)  06/23/22 130 lb 9.6 oz (59.2 kg)    Physical Exam   Outpatient Encounter Medications as of 09/29/2022  Medication Sig  . acidophilus (RISAQUAD) CAPS capsule Take 1 capsule by mouth daily.  Marland Kitchen aspirin EC 81 MG tablet Take 81 mg by mouth daily.  . Cyanocobalamin (VITAMIN B-12) 1000 MCG/15ML LIQD Take by mouth. 1 injection monthly  . latanoprost (XALATAN) 0.005 % ophthalmic solution Place 1 drop into the left eye.  . magnesium gluconate  (MAGONATE) 500 MG tablet Take 500 mg by mouth daily.  . Multiple Vitamin (MULTIVITAMIN) tablet Take 1 tablet by mouth daily.  Marland Kitchen POTASSIUM PO Take by mouth.  . pravastatin (PRAVACHOL) 20 MG tablet Take 1 tablet (20 mg total) by mouth daily.  Marland Kitchen saccharomyces boulardii (FLORASTOR) 250 MG capsule Take by mouth.  . [DISCONTINUED] UNABLE TO  FIND daily. Med Name: Probiotic (Patient not taking: Reported on 07/28/2022)   No facility-administered encounter medications on file as of 09/29/2022.     Lab Results  Component Value Date   WBC 6.2 06/23/2022   HGB 12.8 06/23/2022   HCT 38.0 06/23/2022   PLT 233.0 06/23/2022   GLUCOSE 104 (H) 06/23/2022   CHOL 159 06/23/2022   TRIG 127.0 06/23/2022   HDL 49.80 06/23/2022   LDLDIRECT 70.0 02/22/2022   LDLCALC 84 06/23/2022   ALT 14 06/23/2022   AST 23 06/23/2022   NA 137 06/23/2022   K 4.3 06/23/2022   CL 103 06/23/2022   CREATININE 0.93 06/23/2022   BUN 16 06/23/2022   CO2 26 06/23/2022   TSH 2.05 02/22/2022   HGBA1C 5.6 06/23/2022    MM 3D DIAGNOSTIC MAMMOGRAM UNILATERAL RIGHT BREAST  Result Date: 09/12/2022 CLINICAL DATA:  Right upper breast asymmetry seen on most recent screening mammography. EXAM: DIGITAL DIAGNOSTIC UNILATERAL RIGHT MAMMOGRAM WITH TOMOSYNTHESIS AND CAD; ULTRASOUND RIGHT BREAST LIMITED TECHNIQUE: Right digital diagnostic mammography and breast tomosynthesis was performed. The images were evaluated with computer-aided detection. ; Targeted ultrasound examination of the right breast was performed COMPARISON:  Previous exam(s). ACR Breast Density Category b: There are scattered areas of fibroglandular density. FINDINGS: Additional mammographic views of the right breast demonstrate no suspicious masses or areas of architectural distortion. The previously questioned asymmetry in the upper right breast effaces to glandular tissue with appearance similar to patient's prior mammograms. Targeted right breast ultrasound is performed and  demonstrates no suspicious masses or shadowing lesions in the 9-10 o'clock breast, 6 cm from the nipple, at the expected site of the mammographically questioned focal asymmetry. IMPRESSION: No mammographic or sonographic evidence of right breast malignancy. RECOMMENDATION: Screening mammogram in one year.(Code:SM-B-01Y) I have discussed the findings and recommendations with the patient. If applicable, a reminder letter will be sent to the patient regarding the next appointment. BI-RADS CATEGORY  2: Benign. Electronically Signed   By: Ted Mcalpine M.D.   On: 09/12/2022 10:50   [place] LIMITED ULTRASOUND INCLUDING AXILLA RIGHT BREAST  Result Date: 09/12/2022 CLINICAL DATA:  Right upper breast asymmetry seen on most recent screening mammography. EXAM: DIGITAL DIAGNOSTIC UNILATERAL RIGHT MAMMOGRAM WITH TOMOSYNTHESIS AND CAD; ULTRASOUND RIGHT BREAST LIMITED TECHNIQUE: Right digital diagnostic mammography and breast tomosynthesis was performed. The images were evaluated with computer-aided detection. ; Targeted ultrasound examination of the right breast was performed COMPARISON:  Previous exam(s). ACR Breast Density Category b: There are scattered areas of fibroglandular density. FINDINGS: Additional mammographic views of the right breast demonstrate no suspicious masses or areas of architectural distortion. The previously questioned asymmetry in the upper right breast effaces to glandular tissue with appearance similar to patient's prior mammograms. Targeted right breast ultrasound is performed and demonstrates no suspicious masses or shadowing lesions in the 9-10 o'clock breast, 6 cm from the nipple, at the expected site of the mammographically questioned focal asymmetry. IMPRESSION: No mammographic or sonographic evidence of right breast malignancy. RECOMMENDATION: Screening mammogram in one year.(Code:SM-B-01Y) I have discussed the findings and recommendations with the patient. If applicable, a reminder letter  will be sent to the patient regarding the next appointment. BI-RADS CATEGORY  2: Benign. Electronically Signed   By: Ted Mcalpine M.D.   On: 09/12/2022 10:50       Assessment & Plan:  Hyperglycemia  Hypercholesteremia  Health care maintenance Assessment & Plan: Physical today 09/29/22.  Mammogram 09/12/22 -  - Birads II.  Colonoscopy 08/2016.  Need for influenza vaccination -     Flu Vaccine Trivalent High Dose (Fluad)     Dale Hazard, MD

## 2022-10-01 ENCOUNTER — Encounter: Payer: Self-pay | Admitting: Internal Medicine

## 2022-10-01 NOTE — Assessment & Plan Note (Signed)
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  infundibulum of right upper carotid siphon unlikely to be an aneurysm - will monitor

## 2022-10-01 NOTE — Assessment & Plan Note (Signed)
On pravastatin.  Low cholesterol diet and exercise.  Follow lipid panel and liver function tests.   Lab Results  Component Value Date   CHOL 159 06/23/2022   HDL 49.80 06/23/2022   LDLCALC 84 06/23/2022   LDLDIRECT 70.0 02/22/2022   TRIG 127.0 06/23/2022   CHOLHDL 3 06/23/2022

## 2022-10-01 NOTE — Assessment & Plan Note (Signed)
Follow up pulmonary 07/2022 -  LLL solid nodule and similarly a ground glass nodule in the RLL. The nodule is not spiculated, with no associated lymphadenopathy and is not PET avid on CT. The LLL nodule exhibits 1.4% risk of being malignant based on the [facility] SPN risk score calculator. She is ECOG 0. Both nodules have been stable on repeat chest CT on 07/24/2022. Additionally, the repeat CT in July shows mild tree-in-bud nodularity in the RUL that is likely infectious or inflammatory in origin. Elected to monitor - recommended f/u CT in 6 months.

## 2022-10-01 NOTE — Assessment & Plan Note (Addendum)
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  Off metoprolol. Doing well since ablation.  Feels good. S/p follow up with cardiology 08/2022.

## 2022-10-01 NOTE — Assessment & Plan Note (Signed)
Continue cpap.

## 2022-10-01 NOTE — Assessment & Plan Note (Signed)
Evaluated by Dr Apolinar Junes.  No further w/up warranted.  Recommended f/u prn.

## 2022-10-01 NOTE — Assessment & Plan Note (Signed)
Saw neurology - evaluated of recent blurred vision, etc.  Negative MRI brain and MRA.  Recommended continuing aspirin 81mg  daily and continue pravastatin. 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

## 2022-10-01 NOTE — Assessment & Plan Note (Signed)
Continues to watch diet.  Follow met b and a1c.   Lab Results  Component Value Date   HGBA1C 5.6 06/23/2022

## 2022-10-01 NOTE — Assessment & Plan Note (Signed)
Previous abdominal ultrasound - changes c/w fatty liver.  Follow liver function tests.

## 2022-10-27 ENCOUNTER — Other Ambulatory Visit (INDEPENDENT_AMBULATORY_CARE_PROVIDER_SITE_OTHER): Payer: Medicare HMO

## 2022-10-27 DIAGNOSIS — E7800 Pure hypercholesterolemia, unspecified: Secondary | ICD-10-CM

## 2022-10-27 DIAGNOSIS — R739 Hyperglycemia, unspecified: Secondary | ICD-10-CM | POA: Diagnosis not present

## 2022-10-27 LAB — LIPID PANEL
Cholesterol: 142 mg/dL (ref 0–200)
HDL: 48.5 mg/dL (ref >39.00)
LDL Cholesterol: 76 mg/dL (ref 0–99)
NonHDL: 93.03
Total CHOL/HDL Ratio: 3
Triglycerides: 83 mg/dL (ref 0.0–149.0)
VLDL: 16.6 mg/dL (ref 0.0–40.0)

## 2022-10-27 LAB — HEPATIC FUNCTION PANEL
ALT: 14 U/L (ref 0–35)
AST: 20 U/L (ref 0–37)
Albumin: 4.5 g/dL (ref 3.5–5.2)
Alkaline Phosphatase: 55 U/L (ref 39–117)
Bilirubin, Direct: 0.2 mg/dL (ref 0.0–0.3)
Total Bilirubin: 1.1 mg/dL (ref 0.2–1.2)
Total Protein: 6.6 g/dL (ref 6.0–8.3)

## 2022-10-27 LAB — BASIC METABOLIC PANEL
BUN: 24 mg/dL — ABNORMAL HIGH (ref 6–23)
CO2: 28 meq/L (ref 19–32)
Calcium: 9.7 mg/dL (ref 8.4–10.5)
Chloride: 102 meq/L (ref 96–112)
Creatinine, Ser: 0.91 mg/dL (ref 0.40–1.20)
GFR: 61.47 mL/min (ref >60.00)
Glucose, Bld: 90 mg/dL (ref 70–99)
Potassium: 4.1 meq/L (ref 3.5–5.1)
Sodium: 139 meq/L (ref 135–145)

## 2022-10-27 LAB — HEMOGLOBIN A1C: Hgb A1c MFr Bld: 5.7 % (ref 4.6–6.5)

## 2022-11-01 DIAGNOSIS — G4733 Obstructive sleep apnea (adult) (pediatric): Secondary | ICD-10-CM | POA: Diagnosis not present

## 2022-11-09 DIAGNOSIS — H40153 Residual stage of open-angle glaucoma, bilateral: Secondary | ICD-10-CM | POA: Diagnosis not present

## 2022-12-02 DIAGNOSIS — G4733 Obstructive sleep apnea (adult) (pediatric): Secondary | ICD-10-CM | POA: Diagnosis not present

## 2023-01-01 DIAGNOSIS — G4733 Obstructive sleep apnea (adult) (pediatric): Secondary | ICD-10-CM | POA: Diagnosis not present

## 2023-01-25 ENCOUNTER — Ambulatory Visit
Admission: RE | Admit: 2023-01-25 | Discharge: 2023-01-25 | Disposition: A | Discharge status: Home / Self Care | Payer: Medicare Other | Source: Ambulatory Visit | Attending: Internal Medicine | Admitting: Internal Medicine

## 2023-01-25 DIAGNOSIS — I700 Atherosclerosis of aorta: Secondary | ICD-10-CM | POA: Diagnosis not present

## 2023-01-25 DIAGNOSIS — R918 Other nonspecific abnormal finding of lung field: Secondary | ICD-10-CM | POA: Diagnosis not present

## 2023-01-25 DIAGNOSIS — J984 Other disorders of lung: Secondary | ICD-10-CM | POA: Diagnosis not present

## 2023-01-25 DIAGNOSIS — R911 Solitary pulmonary nodule: Secondary | ICD-10-CM

## 2023-01-31 ENCOUNTER — Ambulatory Visit (INDEPENDENT_AMBULATORY_CARE_PROVIDER_SITE_OTHER): Payer: Medicare Other | Admitting: Internal Medicine

## 2023-01-31 ENCOUNTER — Encounter: Payer: Self-pay | Admitting: Internal Medicine

## 2023-01-31 VITALS — BP 118/70 | HR 88 | Temp 98.0°F | Resp 16 | Ht 65.5 in | Wt 133.0 lb

## 2023-01-31 DIAGNOSIS — I471 Supraventricular tachycardia, unspecified: Secondary | ICD-10-CM

## 2023-01-31 DIAGNOSIS — I720 Aneurysm of carotid artery: Secondary | ICD-10-CM | POA: Diagnosis not present

## 2023-01-31 DIAGNOSIS — R739 Hyperglycemia, unspecified: Secondary | ICD-10-CM | POA: Diagnosis not present

## 2023-01-31 DIAGNOSIS — E7800 Pure hypercholesterolemia, unspecified: Secondary | ICD-10-CM | POA: Diagnosis not present

## 2023-01-31 DIAGNOSIS — R195 Other fecal abnormalities: Secondary | ICD-10-CM | POA: Diagnosis not present

## 2023-01-31 DIAGNOSIS — G4733 Obstructive sleep apnea (adult) (pediatric): Secondary | ICD-10-CM

## 2023-01-31 DIAGNOSIS — R911 Solitary pulmonary nodule: Secondary | ICD-10-CM

## 2023-01-31 LAB — LIPID PANEL
Cholesterol: 155 mg/dL (ref 0–200)
HDL: 48.9 mg/dL (ref >39.00)
LDL Cholesterol: 85 mg/dL (ref 0–99)
NonHDL: 106.34
Total CHOL/HDL Ratio: 3
Triglycerides: 106 mg/dL (ref 0.0–149.0)
VLDL: 21.2 mg/dL (ref 0.0–40.0)

## 2023-01-31 LAB — BASIC METABOLIC PANEL
BUN: 22 mg/dL (ref 6–23)
CO2: 28 meq/L (ref 19–32)
Calcium: 9.5 mg/dL (ref 8.4–10.5)
Chloride: 103 meq/L (ref 96–112)
Creatinine, Ser: 0.88 mg/dL (ref 0.40–1.20)
GFR: 63.87 mL/min (ref >60.00)
Glucose, Bld: 101 mg/dL — ABNORMAL HIGH (ref 70–99)
Potassium: 4.3 meq/L (ref 3.5–5.1)
Sodium: 139 meq/L (ref 135–145)

## 2023-01-31 LAB — HEPATIC FUNCTION PANEL
ALT: 13 U/L (ref 0–35)
AST: 20 U/L (ref 0–37)
Albumin: 4.5 g/dL (ref 3.5–5.2)
Alkaline Phosphatase: 51 U/L (ref 39–117)
Bilirubin, Direct: 0.2 mg/dL (ref 0.0–0.3)
Total Bilirubin: 0.9 mg/dL (ref 0.2–1.2)
Total Protein: 7.2 g/dL (ref 6.0–8.3)

## 2023-01-31 LAB — TSH: TSH: 1.9 u[IU]/mL (ref 0.35–5.50)

## 2023-01-31 LAB — HEMOGLOBIN A1C: Hgb A1c MFr Bld: 5.9 % (ref 4.6–6.5)

## 2023-01-31 NOTE — Progress Notes (Signed)
 Subjective:    Patient ID: Dawn Wolfe, female    DOB: Jan 20, 1945, 78 y.o.   MRN: [id number]  Patient here for  Chief Complaint  Patient presents with   Medical Management of Chronic Issues    HPI Here for a scheduled follow up.  Saw cardiology 08/18/22 - no recurrence.  F/u prn. Saw neurology - evaluated of recent blurred vision, etc.  Negative MRI brain and MRA.  Recommended continuing aspirin 81mg  daily and continue pravastatin . 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 Saw Dr Darnelle Elders 07/28/22 - f/u lung nodule. Recommended f/u CT in 6 months. Just had CT.  Waiting for results. Just had scan. Reports she is doing well. Staying active. No chest pain or sob reported.  Exercising regularly. No abdominal pain. Since taking probiotics - bowels doing well.    Past Medical History:  Diagnosis Date   Anemia    h/o as a child   Arthritis    left shoulder   Cancer (HCC) 15 yrs ago   [employer] on the face. Surgical removal   Complication of anesthesia    Hemorrhoids    History of colonoscopy 2003   Normal, Dr. Toribio Frees, [place]    Hypercholesterolemia    Normal exercise sestamibi stress test 2008   Ken Fath   Obstructive sleep apnea on CPAP 2007   PONV (postoperative nausea and vomiting)    Rotator cuff tear, left    Sleep apnea    Past Surgical History:  Procedure Laterality Date   ABDOMINALPLASTY W/ LIPOSUCTION  AGE 2   BUNIONECTOMY  X3  LAST ONE 2003   CHOLECYSTECTOMY N/A 04/19/2015   Procedure: LAPAROSCOPIC CHOLECYSTECTOMY WITH INTRAOPERATIVE CHOLANGIOGRAM;  Surgeon: Marshall Skeeter, MD;  Location: [facility] ORS;  Service: General;  Laterality: N/A;   COLONOSCOPY  2012   Dr Rheba Cedar in [place]   COLONOSCOPY WITH PROPOFOL  N/A 09/11/2016   Procedure: COLONOSCOPY WITH PROPOFOL ;   Surgeon: Deveron Fly, MD;  Location: [facility] ENDOSCOPY;  Service: Endoscopy;  Laterality: N/A;   ERCP N/A 04/29/2015   Procedure: ENDOSCOPIC RETROGRADE CHOLANGIOPANCREATOGRAPHY (ERCP);  Surgeon: Stephens Eis, MD;  Location: [facility] ENDOSCOPY;  Service: Gastroenterology;  Laterality: N/A;   EYE SURGERY     cataract extraction   NASAL SEPTUM SURGERY  AGE 80   NASAL SINUS SURGERY  AGE 68   SHOULDER ARTHROSCOPY  10/25/2011   Procedure: ARTHROSCOPY SHOULDER;  Surgeon: Verlinda Gloss, MD;  Location: [facility];  Service: Orthopedics;  Laterality: Left;  with SAD, shave of the labrium    TUBAL LIGATION  1982   Family History  Problem Relation Age of Onset   Acute myelogenous leukemia Sister    Polycythemia Sister 51   Emphysema Father        smoker   Heart disease Father        myocardial infarction - 71   COPD Father    Heart attack Father    Breast cancer Paternal Aunt    Testicular cancer Brother    Heart disease Mother    Emphysema Mother    COPD Mother    Asthma Mother    Heart attack Mother    Leukemia Sister    Colon cancer Neg Hx    Social History   Socioeconomic History  Marital status: Married    Spouse name: Not on file   Number of children: Not on file   Years of education: Not on file   Highest education level: Not on file  Occupational History   Not on file  Tobacco Use   Smoking status: Former    Current packs/day: 0.00    Average packs/day: 2.5 packs/day for 10.0 years (25.0 ttl pk-yrs)    Types: Cigarettes    Start date: 03/29/1967    Quit date: 03/28/1977    Years since quitting: 45.8   Smokeless tobacco: Never  Substance and Sexual Activity   Alcohol use: No    Alcohol/week: 0.0 standard drinks of alcohol   Drug use: No   Sexual activity: Not Currently    Birth control/protection: Post-menopausal  Other Topics Concern   Not on file  Social History Narrative   Not on file   Social Drivers of Health   Financial Resource Strain:  Low Risk  (05/19/2022)   Overall Financial Resource Strain (CARDIA)    Difficulty of Paying Living Expenses: Not hard at all  Food Insecurity: No Food Insecurity (05/19/2022)   Hunger Vital Sign    Worried About Running Out of Food in the Last Year: Never true    Ran Out of Food in the Last Year: Never true  Transportation Needs: No Transportation Needs (05/06/2021)   PRAPARE - Administrator, Civil Service (Medical): No    Lack of Transportation (Non-Medical): No  Physical Activity: Insufficiently Active (05/19/2022)   Exercise Vital Sign    Days of Exercise per Week: 7 days    Minutes of Exercise per Session: 20 min  Stress: No Stress Concern Present (05/19/2022)   [employer] of Occupational Health - Occupational Stress Questionnaire    Feeling of Stress : Not at all  Social Connections: Socially Integrated (05/19/2022)   Social Connection and Isolation Panel [NHANES]    Frequency of Communication with Friends and Family: More than three times a week    Frequency of Social Gatherings with Friends and Family: More than three times a week    Attends Religious Services: More than 4 times per year    Active Member of [employer] or Organizations: Yes    Attends Engineer, structural: More than 4 times per year    Marital Status: Married     Review of Systems  Constitutional:  Negative for appetite change and unexpected weight change.  HENT:  Negative for congestion and sinus pressure.   Respiratory:  Negative for cough, chest tightness and shortness of breath.   Cardiovascular:  Negative for chest pain, palpitations and leg swelling.  Gastrointestinal:  Negative for abdominal pain, diarrhea, nausea and vomiting.  Genitourinary:  Negative for difficulty urinating and dysuria.  Musculoskeletal:  Negative for joint swelling and myalgias.  Skin:  Negative for color change and rash.  Neurological:  Negative for dizziness and headaches.       Rare occasional light headedness -  when first starts on treadmill. No persistent problem and does not occur often. Discussed staying hydrated and monitoring blood pressure.   Psychiatric/Behavioral:  Negative for agitation and dysphoric mood.        Objective:     BP 118/70   Pulse 88   Temp 98 F (36.7 C)   Resp 16   Ht 5' 5.5" (1.664 m)   Wt 133 lb (60.3 kg)   SpO2 98%   BMI 21.80 kg/m  Wt Readings from  Last 3 Encounters:  01/31/23 133 lb (60.3 kg)  09/29/22 132 lb 12.8 oz (60.2 kg)  07/28/22 133 lb (60.3 kg)    Physical Exam Vitals reviewed.  Constitutional:      General: She is not in acute distress.    Appearance: Normal appearance.  HENT:     Head: Normocephalic and atraumatic.     Right Ear: External ear normal.     Left Ear: External ear normal.     Mouth/Throat:     Pharynx: No oropharyngeal exudate or posterior oropharyngeal erythema.  Eyes:     General: No scleral icterus.       Right eye: No discharge.        Left eye: No discharge.     Conjunctiva/sclera: Conjunctivae normal.  Neck:     Thyroid : No thyromegaly.  Cardiovascular:     Rate and Rhythm: Normal rate and regular rhythm.  Pulmonary:     Effort: No respiratory distress.     Breath sounds: Normal breath sounds. No wheezing.  Abdominal:     General: Bowel sounds are normal.     Palpations: Abdomen is soft.     Tenderness: There is no abdominal tenderness.  Musculoskeletal:        General: No swelling or tenderness.     Cervical back: Neck supple. No tenderness.  Lymphadenopathy:     Cervical: No cervical adenopathy.  Skin:    Findings: No erythema or rash.  Neurological:     Mental Status: She is alert.  Psychiatric:        Mood and Affect: Mood normal.        Behavior: Behavior normal.         Outpatient Encounter Medications as of 01/31/2023  Medication Sig   acidophilus (RISAQUAD) CAPS capsule Take 1 capsule by mouth daily.   aspirin EC 81 MG tablet Take 81 mg by mouth daily.   Cyanocobalamin  (VITAMIN B-12)  1000 MCG/15ML LIQD Take by mouth. 1 injection monthly   latanoprost (XALATAN) 0.005 % ophthalmic solution Place 1 drop into the left eye.   magnesium gluconate (MAGONATE) 500 MG tablet Take 500 mg by mouth daily.   Multiple Vitamin (MULTIVITAMIN) tablet Take 1 tablet by mouth daily.   POTASSIUM PO Take by mouth.   pravastatin  (PRAVACHOL ) 20 MG tablet Take 1 tablet (20 mg total) by mouth daily.   saccharomyces boulardii (FLORASTOR) 250 MG capsule Take by mouth.   No facility-administered encounter medications on file as of 01/31/2023.     Lab Results  Component Value Date   WBC 6.2 06/23/2022   HGB 12.8 06/23/2022   HCT 38.0 06/23/2022   PLT 233.0 06/23/2022   GLUCOSE 90 10/27/2022   CHOL 142 10/27/2022   TRIG 83.0 10/27/2022   HDL 48.50 10/27/2022   LDLDIRECT 70.0 02/22/2022   LDLCALC 76 10/27/2022   ALT 14 10/27/2022   AST 20 10/27/2022   NA 139 10/27/2022   K 4.1 10/27/2022   CL 102 10/27/2022   CREATININE 0.91 10/27/2022   BUN 24 (H) 10/27/2022   CO2 28 10/27/2022   TSH 2.05 02/22/2022   HGBA1C 5.7 10/27/2022    No results found.     Assessment & Plan:  Hypercholesteremia Assessment & Plan: On pravastatin .  Low cholesterol diet and exercise.  Follow lipid panel and liver function tests.   Lab Results  Component Value Date   CHOL 142 10/27/2022   HDL 48.50 10/27/2022   LDLCALC 76 10/27/2022   LDLDIRECT 70.0  02/22/2022   TRIG 83.0 10/27/2022   CHOLHDL 3 10/27/2022    Orders: -     Basic metabolic panel -     Hepatic function panel -     Lipid panel -     TSH  Hyperglycemia Assessment & Plan: Continues to watch diet.  Follow met b and a1c.   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  infundibulum of right upper carotid siphon  unlikely to be an aneurysm - will monitor    Loose stools Assessment & Plan: Bowels better since being on probiotics.    Obstructive sleep apnea Assessment & Plan: CPAP   Pulmonary nodule Assessment & Plan: Follow up pulmonary 07/2022 -  LLL solid nodule and similarly a ground glass nodule in the RLL. The nodule is not spiculated, with no associated lymphadenopathy and is not PET avid on CT. The LLL nodule exhibits 1.4% risk of being malignant based on the [facility] SPN risk score calculator. She is ECOG 0. Both nodules have been stable on repeat chest CT on 07/24/2022. Additionally, the repeat CT in July shows mild tree-in-bud nodularity in the RUL that is likely infectious or inflammatory in origin. Elected to monitor - recommended f/u CT in 6 months. Just had f/u CT. Waiting for results.    SVT (supraventricular tachycardia) (HCC) Assessment & Plan: S/p catheter ablation (SVT) 05/31/22.  Off metoprolol . Doing well since ablation.  Feels good. S/p follow up with cardiology. Doing well.       Dellar Fenton, MD

## 2023-01-31 NOTE — Assessment & Plan Note (Signed)
 On pravastatin .  Low cholesterol diet and exercise.  Follow lipid panel and liver function tests.   Lab Results  Component Value Date   CHOL 142 10/27/2022   HDL 48.50 10/27/2022   LDLCALC 76 10/27/2022   LDLDIRECT 70.0 02/22/2022   TRIG 83.0 10/27/2022   CHOLHDL 3 10/27/2022

## 2023-01-31 NOTE — Assessment & Plan Note (Signed)
 Follow up pulmonary 07/2022 -  LLL solid nodule and similarly a ground glass nodule in the RLL. The nodule is not spiculated, with no associated lymphadenopathy and is not PET avid on CT. The LLL nodule exhibits 1.4% risk of being malignant based on the [facility] SPN risk score calculator. She is ECOG 0. Both nodules have been stable on repeat chest CT on 07/24/2022. Additionally, the repeat CT in July shows mild tree-in-bud nodularity in the RUL that is likely infectious or inflammatory in origin. Elected to monitor - recommended f/u CT in 6 months. Just had f/u CT. Waiting for results.

## 2023-01-31 NOTE — Assessment & Plan Note (Signed)
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  infundibulum of right upper carotid siphon unlikely to be an aneurysm - will monitor

## 2023-01-31 NOTE — Assessment & Plan Note (Signed)
 Continues to watch diet.  Follow met b and a1c.   Lab Results  Component Value Date   HGBA1C 5.7 10/27/2022

## 2023-01-31 NOTE — Assessment & Plan Note (Signed)
 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  Off metoprolol . Doing well since ablation.  Feels good. S/p follow up with cardiology. Doing well.

## 2023-01-31 NOTE — Assessment & Plan Note (Signed)
 CPAP.

## 2023-01-31 NOTE — Assessment & Plan Note (Signed)
 Bowels better since being on probiotics.

## 2023-02-01 ENCOUNTER — Other Ambulatory Visit: Payer: Self-pay

## 2023-02-01 MED ORDER — PRAVASTATIN SODIUM 20 MG PO TABS: 20.0000 mg | ORAL_TABLET | Freq: Every day | ORAL | 3 refills | Status: DC

## 2023-02-02 DIAGNOSIS — G4733 Obstructive sleep apnea (adult) (pediatric): Secondary | ICD-10-CM | POA: Diagnosis not present

## 2023-03-01 DIAGNOSIS — H40153 Residual stage of open-angle glaucoma, bilateral: Secondary | ICD-10-CM | POA: Diagnosis not present

## 2023-03-05 DIAGNOSIS — G4733 Obstructive sleep apnea (adult) (pediatric): Secondary | ICD-10-CM | POA: Diagnosis not present

## 2023-03-14 ENCOUNTER — Ambulatory Visit: Payer: Medicare Other | Admitting: Student in an Organized Health Care Education/Training Program

## 2023-03-14 ENCOUNTER — Encounter: Payer: Self-pay | Admitting: Student in an Organized Health Care Education/Training Program

## 2023-03-14 VITALS — BP 124/64 | HR 86 | Temp 97.6°F | Ht 65.5 in | Wt 138.0 lb

## 2023-03-14 DIAGNOSIS — R911 Solitary pulmonary nodule: Secondary | ICD-10-CM | POA: Diagnosis not present

## 2023-03-14 DIAGNOSIS — Z87891 Personal history of nicotine dependence: Secondary | ICD-10-CM

## 2023-03-14 NOTE — Progress Notes (Signed)
 Synopsis: Referred in for pulmonary nodule by Dale Ruleville, MD  Assessment & Plan:   1. Pulmonary nodule 1 cm or greater in diameter (Primary)  Presenting with a 10 mm LLL solid nodule and similarly a ground glass nodule in the RLL. The nodule is not spiculated, with no associated lymphadenopathy and is not PET avid on CT. The LLL nodule exhibits 1.4% risk of being malignant based on the [facility] SPN risk score calculator. She is ECOG 0. Both nodules have been stable on multiple repeat chest CT's.   I had discussed the diagnosis of lung malignancy and approach to pulmonary nodules with Ms. Pizana during our many visits, and she would like to continue with radiographic monitoring rather than have a biopsy. We will continue with radiographic surveillance of the images and I will obtain a repeat chest CT in 1 year.  - CT CHEST WO CONTRAST; Future   Return in about 1 year (around 03/13/2024).  I spent 30 minutes caring for this patient today, including preparing to see the patient, obtaining a medical history , reviewing a separately obtained history, performing a medically appropriate examination and/or evaluation, counseling and educating the patient/family/caregiver, ordering medications, tests, or procedures, and documenting clinical information in the electronic health record  Raechel Chute, MD [facility] 03/14/2023 2:31 PM    End of visit medications:  No orders of the defined types were placed in this encounter.    Current Outpatient Medications:    acidophilus (RISAQUAD) CAPS capsule, Take 1 capsule by mouth daily., Disp: , Rfl:    aspirin EC 81 MG tablet, Take 81 mg by mouth daily., Disp: , Rfl:    latanoprost (XALATAN) 0.005 % ophthalmic solution, Place 1 drop into the left eye., Disp: , Rfl: 3   magnesium gluconate (MAGONATE) 500 MG tablet, Take 1,000 mg by mouth daily., Disp: , Rfl:    Multiple Vitamin (MULTIVITAMIN) tablet, Take 1 tablet by  mouth daily., Disp: , Rfl:    POTASSIUM PO, Take by mouth., Disp: , Rfl:    pravastatin (PRAVACHOL) 20 MG tablet, Take 1 tablet (20 mg total) by mouth daily., Disp: 90 tablet, Rfl: 3   saccharomyces boulardii (FLORASTOR) 250 MG capsule, Take by mouth., Disp: , Rfl:    Cyanocobalamin (VITAMIN B-12) 1000 MCG/15ML LIQD, Take by mouth. 1 injection monthly (Patient not taking: Reported on 03/14/2023), Disp: , Rfl:    Subjective:   PATIENT ID: Dawn Wolfe GENDER: female DOB: 10-20-1945, MRN: [id number]  Chief Complaint  Patient presents with   Follow-up    No cough, shortness of breath or wheezing.     HPI  Dawn Wolfe is a pleasant 78 year old female presenting for follow up of a pulmonary nodule.  She feels well overall, and denies any respiratory symptoms. She did have her repeat chest CT and is here to discuss results. She did have an ablation for her SVT at [facility] since our last visit and has not had any recurrence. She has no shortness of breath, cough, chest pain, wheeze, or chest tightness.    She get a coronary calcium screen 09/06/2021 which was noted to have a solid pulmonary nodule in the left lower lobe and a groundglass nodule in the right lower lobe. We had ordered a dedicated CT of the chest and a PET/CT and patient decided to proceed with radiographic monitoring of the nodules rather than go for biopsy. She does have a history of sleep apnea and sees [employer],  NP for it.  She reports compliance with her CPAP and that it has made her sleep and energy levels a lot better.  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  She is a non-smoker and does not drink.  Ancillary information including prior medications, full medical/surgical/family/social histories, and PFTs (when available) are listed below and have been reviewed.   Review of Systems  Constitutional:  Negative for chills, fever and weight loss.  Respiratory:  Negative for cough,  hemoptysis, sputum production, shortness of breath and wheezing.   Cardiovascular:  Negative for chest pain and orthopnea.  Gastrointestinal:  Negative for heartburn.  Skin:  Negative for rash.     Objective:   Vitals:   03/14/23 1407  BP: 124/64  Pulse: 86  Temp: 97.6 F (36.4 C)  TempSrc: Temporal  SpO2: 96%  Weight: 138 lb (62.6 kg)  Height: 5' 5.5" (1.664 m)   96% on RA BMI Readings from Last 3 Encounters:  03/14/23 22.62 kg/m  01/31/23 21.80 kg/m  09/29/22 22.10 kg/m   Wt Readings from Last 3 Encounters:  03/14/23 138 lb (62.6 kg)  01/31/23 133 lb (60.3 kg)  09/29/22 132 lb 12.8 oz (60.2 kg)    Physical Exam Constitutional:      Appearance: Normal appearance.  HENT:     Head: Normocephalic.     Mouth/Throat:     Mouth: Mucous membranes are moist.  Cardiovascular:     Rate and Rhythm: Normal rate and regular rhythm.     Heart sounds: Normal heart sounds.  Pulmonary:     Effort: Pulmonary effort is normal.     Breath sounds: Normal breath sounds.  Musculoskeletal:        General: Normal range of motion.     Cervical back: Normal range of motion.  Skin:    General: Skin is warm.  Neurological:     General: No focal deficit present.     Mental Status: She is alert and oriented to person, place, and time.       Ancillary Information    Past Medical History:  Diagnosis Date   Anemia    h/o as a child   Arthritis    left shoulder   Cancer (HCC) 15 yrs ago   [employer] on the face. Surgical removal   Complication of anesthesia    Hemorrhoids    History of colonoscopy 2003   Normal, Dr. Sherin Quarry, [place]    Hypercholesterolemia    Normal exercise sestamibi stress test 2008   Ken Fath   Obstructive sleep apnea on CPAP 2007   PONV (postoperative nausea and vomiting)    Rotator cuff tear, left    Sleep apnea      Family History  Problem Relation Age of Onset   Acute myelogenous leukemia Sister    Polycythemia Sister 69   Emphysema Father         smoker   Heart disease Father        myocardial infarction - 51   COPD Father    Heart attack Father    Breast cancer Paternal Aunt    Testicular cancer Brother    Heart disease Mother    Emphysema Mother    COPD Mother    Asthma Mother    Heart attack Mother    Leukemia Sister    Colon cancer Neg Hx      Past Surgical History:  Procedure Laterality Date   ABDOMINALPLASTY W/ LIPOSUCTION  AGE 73   BUNIONECTOMY  X3  LAST ONE 2003   CHOLECYSTECTOMY N/A 04/19/2015   Procedure: LAPAROSCOPIC CHOLECYSTECTOMY WITH INTRAOPERATIVE CHOLANGIOGRAM;  Surgeon: Earline Mayotte, MD;  Location: [facility] ORS;  Service: General;  Laterality: N/A;   COLONOSCOPY  2012   Dr Robin Searing in [place]   COLONOSCOPY WITH PROPOFOL N/A 09/11/2016   Procedure: COLONOSCOPY WITH PROPOFOL;  Surgeon: Christena Deem, MD;  Location: [facility] ENDOSCOPY;  Service: Endoscopy;  Laterality: N/A;   ERCP N/A 04/29/2015   Procedure: ENDOSCOPIC RETROGRADE CHOLANGIOPANCREATOGRAPHY (ERCP);  Surgeon: Wallace Cullens, MD;  Location: [facility] ENDOSCOPY;  Service: Gastroenterology;  Laterality: N/A;   EYE SURGERY     cataract extraction   NASAL SEPTUM SURGERY  AGE 76   NASAL SINUS SURGERY  AGE 69   SHOULDER ARTHROSCOPY  10/25/2011   Procedure: ARTHROSCOPY SHOULDER;  Surgeon: Drucilla Schmidt, MD;  Location: [facility] [place];  Service: Orthopedics;  Laterality: Left;  with SAD, shave of the labrium    TUBAL LIGATION  1982    Social History   Socioeconomic History   Marital status: Married    Spouse name: Not on file   Number of children: Not on file   Years of education: Not on file   Highest education level: Not on file  Occupational History   Not on file  Tobacco Use   Smoking status: Former    Current packs/day: 0.00    Average packs/day: 2.5 packs/day for 10.0 years (25.0 ttl pk-yrs)    Types: Cigarettes    Start date: 03/29/1967    Quit date: 03/28/1977    Years since quitting: 45.9   Smokeless tobacco:  Never  Substance and Sexual Activity   Alcohol use: No    Alcohol/week: 0.0 standard drinks of alcohol   Drug use: No   Sexual activity: Not Currently    Birth control/protection: Post-menopausal  Other Topics Concern   Not on file  Social History Narrative   Not on file   Social Drivers of Health   Financial Resource Strain: Low Risk  (05/19/2022)   Overall Financial Resource Strain (CARDIA)    Difficulty of Paying Living Expenses: Not hard at all  Food Insecurity: No Food Insecurity (05/19/2022)   Hunger Vital Sign    Worried About Running Out of Food in the Last Year: Never true    Ran Out of Food in the Last Year: Never true  Transportation Needs: No Transportation Needs (05/06/2021)   PRAPARE - Administrator, Civil Service (Medical): No    Lack of Transportation (Non-Medical): No  Physical Activity: Insufficiently Active (05/19/2022)   Exercise Vital Sign    Days of Exercise per Week: 7 days    Minutes of Exercise per Session: 20 min  Stress: No Stress Concern Present (05/19/2022)   [employer] of Occupational Health - Occupational Stress Questionnaire    Feeling of Stress : Not at all  Social Connections: Socially Integrated (05/19/2022)   Social Connection and Isolation Panel [NHANES]    Frequency of Communication with Friends and Family: More than three times a week    Frequency of Social Gatherings with Friends and Family: More than three times a week    Attends Religious Services: More than 4 times per year    Active Member of [employer] or Organizations: Yes    Attends Engineer, structural: More than 4 times per year    Marital Status: Married  Catering manager Violence: Not At Risk (  05/19/2022)   Humiliation, Afraid, Rape, and Kick questionnaire    Fear of Current or Ex-Partner: No    Emotionally Abused: No    Physically Abused: No    Sexually Abused: No     Allergies  Allergen Reactions   Codeine Nausea And Vomiting     CBC    Component  Value Date/Time   WBC 6.2 06/23/2022 1058   RBC 4.35 06/23/2022 1058   HGB 12.8 06/23/2022 1058   HCT 38.0 06/23/2022 1058   PLT 233.0 06/23/2022 1058   MCV 87.5 06/23/2022 1058   MCH 29.3 01/27/2022 1022   MCHC 33.6 06/23/2022 1058   RDW 13.9 06/23/2022 1058   LYMPHSABS 1.9 06/23/2022 1058   MONOABS 0.4 06/23/2022 1058   EOSABS 0.2 06/23/2022 1058   BASOSABS 0.1 06/23/2022 1058    Pulmonary Functions Testing Results:     No data to display          Outpatient Medications Prior to Visit  Medication Sig Dispense Refill   acidophilus (RISAQUAD) CAPS capsule Take 1 capsule by mouth daily.     aspirin EC 81 MG tablet Take 81 mg by mouth daily.     latanoprost (XALATAN) 0.005 % ophthalmic solution Place 1 drop into the left eye.  3   magnesium gluconate (MAGONATE) 500 MG tablet Take 1,000 mg by mouth daily.     Multiple Vitamin (MULTIVITAMIN) tablet Take 1 tablet by mouth daily.     POTASSIUM PO Take by mouth.     pravastatin (PRAVACHOL) 20 MG tablet Take 1 tablet (20 mg total) by mouth daily. 90 tablet 3   saccharomyces boulardii (FLORASTOR) 250 MG capsule Take by mouth.     Cyanocobalamin (VITAMIN B-12) 1000 MCG/15ML LIQD Take by mouth. 1 injection monthly (Patient not taking: Reported on 03/14/2023)     No facility-administered medications prior to visit.

## 2023-04-02 DIAGNOSIS — G4733 Obstructive sleep apnea (adult) (pediatric): Secondary | ICD-10-CM | POA: Diagnosis not present

## 2023-04-23 ENCOUNTER — Ambulatory Visit (INDEPENDENT_AMBULATORY_CARE_PROVIDER_SITE_OTHER)

## 2023-04-23 VITALS — BP 119/69 | HR 88 | Ht 65.0 in | Wt 138.0 lb

## 2023-04-23 DIAGNOSIS — Z0000 Encounter for general adult medical examination without abnormal findings: Secondary | ICD-10-CM

## 2023-04-23 NOTE — Progress Notes (Signed)
 Subjective:   Dawn Wolfe is a 78 y.o. who presents for a Medicare Wellness preventive visit.  Visit Complete: Virtual I connected with  Dawn Wolfe on 04/23/23 by a audio enabled telemedicine application and verified that I am speaking with the correct person using two identifiers.  Patient Location: Home  Provider Location: Home Office  I discussed the limitations of evaluation and management by telemedicine. The patient expressed understanding and agreed to proceed.  Vital Signs: Because this visit was a virtual/telehealth visit, some criteria may be missing or patient reported. Any vitals not documented were not able to be obtained and vitals that have been documented are patient reported.  VideoDeclined- This patient declined Librarian, academic. Therefore the visit was completed with audio only.  Persons Participating in Visit: Patient.  AWV Questionnaire: No: Patient Medicare AWV questionnaire was not completed prior to this visit.  Cardiac Risk Factors include: advanced age (>27men, >17 women);dyslipidemia;hypertension     Objective:    Today's Vitals   04/23/23 1403  BP: 119/69  Pulse: 88  Weight: 138 lb (62.6 kg)  Height: 5\' 5"  (1.651 m)   Body mass index is 22.96 kg/m.     04/23/2023    2:33 PM 05/19/2022    1:11 PM 05/11/2022    8:48 AM 01/27/2022   10:22 AM 05/06/2021    3:31 PM 05/05/2020   10:43 AM 05/02/2019   10:38 AM  Advanced Directives  Does Patient Have a Medical Advance Directive? No Yes No No Yes Yes Yes  Type of Research scientist (life sciences) will Healthcare Power of [place];Living will Healthcare Power of Attorney  Does patient want to make changes to medical advance directive?     No - Patient declined No - Patient declined No - Patient declined  Copy of Healthcare Power of Attorney in Chart?      No - copy requested No - copy requested    Current Medications  (verified) Outpatient Encounter Medications as of 04/23/2023  Medication Sig   acidophilus (RISAQUAD) CAPS capsule Take 1 capsule by mouth daily.   aspirin EC 81 MG tablet Take 81 mg by mouth daily.   Cyanocobalamin (VITAMIN B-12) 1000 MCG/15ML LIQD Take by mouth. 1 injection monthly   latanoprost (XALATAN) 0.005 % ophthalmic solution Place 1 drop into the left eye.   magnesium gluconate (MAGONATE) 500 MG tablet Take 1,000 mg by mouth daily.   Multiple Vitamin (MULTIVITAMIN) tablet Take 1 tablet by mouth daily.   POTASSIUM PO Take by mouth.   pravastatin (PRAVACHOL) 20 MG tablet Take 1 tablet (20 mg total) by mouth daily.   saccharomyces boulardii (FLORASTOR) 250 MG capsule Take by mouth.   No facility-administered encounter medications on file as of 04/23/2023.    Allergies (verified) Codeine   History: Past Medical History:  Diagnosis Date   Anemia    h/o as a child   Arthritis    left shoulder   Cancer (HCC) 15 yrs ago   [employer] on the face. Surgical removal   Complication of anesthesia    Hemorrhoids    History of colonoscopy 2003   Normal, Dr. Sherin Quarry, [place]    Hypercholesterolemia    Normal exercise sestamibi stress test 2008   Ken Fath   Obstructive sleep apnea on CPAP 2007   PONV (postoperative nausea and vomiting)    Rotator cuff tear, left    Sleep apnea    Past Surgical History:  Procedure Laterality Date   ABDOMINALPLASTY W/ LIPOSUCTION  AGE 59   BUNIONECTOMY  X3  LAST ONE 2003   CHOLECYSTECTOMY N/A 04/19/2015   Procedure: LAPAROSCOPIC CHOLECYSTECTOMY WITH INTRAOPERATIVE CHOLANGIOGRAM;  Surgeon: Earline Mayotte, MD;  Location: [facility] ORS;  Service: General;  Laterality: N/A;   COLONOSCOPY  2012   Dr Robin Searing in [place]   COLONOSCOPY WITH PROPOFOL N/A 09/11/2016   Procedure: COLONOSCOPY WITH PROPOFOL;  Surgeon: Christena Deem, MD;  Location[facility] ENDOSCOPY;  Service: Endoscopy;  Laterality: N/A;   ERCP N/A 04/29/2015   Procedure: ENDOSCOPIC  RETROGRADE CHOLANGIOPANCREATOGRAPHY (ERCP);  Surgeon: Wallace Cullens, MD;  Location: [facility] ENDOSCOPY;  Service: Gastroenterology;  Laterality: N/A;   EYE SURGERY     cataract extraction   NASAL SEPTUM SURGERY  AGE 24   NASAL SINUS SURGERY  AGE 59   SHOULDER ARTHROSCOPY  10/25/2011   Procedure: ARTHROSCOPY SHOULDER;  Surgeon: Drucilla Schmidt, MD;  Location: [facility];  Service: Orthopedics;  Laterality: Left;  with SAD, shave of the labrium    TUBAL LIGATION  1982   Family History  Problem Relation Age of Onset   Acute myelogenous leukemia Sister    Polycythemia Sister 31   Emphysema Father        smoker   Heart disease Father        myocardial infarction - 5   COPD Father    Heart attack Father    Breast cancer Paternal Aunt    Testicular cancer Brother    Heart disease Mother    Emphysema Mother    COPD Mother    Asthma Mother    Heart attack Mother    Leukemia Sister    Colon cancer Neg Hx    Social History   Socioeconomic History   Marital status: Married    Spouse name: Not on file   Number of children: Not on file   Years of education: Not on file   Highest education level: Not on file  Occupational History   Not on file  Tobacco Use   Smoking status: Former    Current packs/day: 0.00    Average packs/day: 2.5 packs/day for 10.0 years (25.0 ttl pk-yrs)    Types: Cigarettes    Start date: 03/29/1967    Quit date: 03/28/1977    Years since quitting: 46.1   Smokeless tobacco: Never  Substance and Sexual Activity   Alcohol use: No    Alcohol/week: 0.0 standard drinks of alcohol   Drug use: No   Sexual activity: Not Currently    Birth control/protection: Post-menopausal  Other Topics Concern   Not on file  Social History Narrative   Not on file   Social Drivers of Health   Financial Resource Strain: Low Risk  (04/23/2023)   Overall Financial Resource Strain (CARDIA)    Difficulty of Paying Living Expenses: Not hard at all  Food Insecurity: No  Food Insecurity (04/23/2023)   Hunger Vital Sign    Worried About Running Out of Food in the Last Year: Never true    Ran Out of Food in the Last Year: Never true  Transportation Needs: No Transportation Needs (04/23/2023)   PRAPARE - Administrator, Civil Service (Medical): No    Lack of Transportation (Non-Medical): No  Physical Activity: Insufficiently Active (04/23/2023)   Exercise Vital Sign    Days of Exercise per Week: 3 days    Minutes of Exercise per Session: 40 min  Stress:  No Stress Concern Present (04/23/2023)   [employer] of Occupational Health - Occupational Stress Questionnaire    Feeling of Stress : Not at all  Social Connections: Socially Integrated (04/23/2023)   Social Connection and Isolation Panel [NHANES]    Frequency of Communication with Friends and Family: More than three times a week    Frequency of Social Gatherings with Friends and Family: More than three times a week    Attends Religious Services: More than 4 times per year    Active Member of [employer] or Organizations: Yes    Attends Banker Meetings: Never    Marital Status: Married    Tobacco Counseling Counseling given: Yes    Clinical Intake:  Pre-visit preparation completed: Yes  Pain : No/denies pain     BMI - recorded: 22.96 Nutritional Status: BMI of 19-24  Normal Nutritional Risks: None Diabetes: No  Lab Results  Component Value Date   HGBA1C 5.9 01/31/2023   HGBA1C 5.7 10/27/2022   HGBA1C 5.6 06/23/2022     How often do you need to have someone help you when you read instructions, pamphlets, or other written materials from your doctor or pharmacy?: 1 - Never  Interpreter Needed?: No  Information entered by :: Alia T/CMA   Activities of Daily Living      04/23/2023    2:12 PM 05/19/2022    1:13 PM  In your present state of health, do you have any difficulty performing the following activities:  Hearing? 0 0  Vision? 0 0  Difficulty concentrating or  making decisions? 0 0  Walking or climbing stairs? 0 0  Dressing or bathing? 0 0  Doing errands, shopping? 0 0  Preparing Food and eating ? N Y  Using the Toilet? N Y  In the past six months, have you accidently leaked urine? N Y  Do you have problems with loss of bowel control? N Y  Managing your Medications? N Y  Managing your Finances? N Y  Housekeeping or managing your Housekeeping? N Y    Patient Care Team: Dale Millerton, MD as PCP - General (Internal Medicine) Dale [place], MD (Internal Medicine) Lemar Livings Merrily Pew, MD (General Surgery) Raechel Chute, MD as Consulting Physician (Pulmonary Disease)  Indicate any recent Medical Services you may have received from other than [facility] providers in the past year (date may be approximate).     Assessment:   This is a routine wellness examination for Dawn Wolfe.  Hearing/Vision screen Hearing Screening - Comments:: Pt denies hearing Def Vision Screening - Comments:: Pt denies vision def Pt goes [employer] [facility] in [place], [place]   Goals Addressed               This Visit's Progress     Maintain Weight (pt-stated)   On track     Stay active. Stay hydrated. Healthy diet.      Patient Stated   On track     To work more in garden work on tomatoes        Depression Screen     04/23/2023    2:11 PM 04/23/2023    2:10 PM 09/29/2022   11:35 AM 05/19/2022    1:08 PM 02/22/2022   12:30 PM 12/20/2021    8:24 AM 05/06/2021    3:29 PM  PHQ 2/9 Scores  PHQ - 2 Score 0 0 0 0 0 0 0  PHQ- 9 Score 0 0 0        Fall Risk  04/23/2023    2:09 PM 09/29/2022   11:35 AM 05/19/2022    9:40 AM 02/22/2022   12:30 PM 12/20/2021    8:24 AM  Fall Risk   Falls in the past year? 0 0 0 0 0  Number falls in past yr: 0 0 0 0 0  Injury with Fall? 0 0 0 0 0  Risk for fall due to : No Fall Risks No Fall Risks  No Fall Risks No Fall Risks  Follow up Falls prevention discussed;Falls evaluation completed Falls evaluation completed  Falls evaluation  completed Falls evaluation completed    MEDICARE RISK AT HOME:  Medicare Risk at Home Any stairs in or around the home?: No If so, are there any without handrails?: No Home free of loose throw rugs in walkways, pet beds, electrical cords, etc?: Yes Adequate lighting in your home to reduce risk of falls?: Yes Life alert?: No Use of a cane, walker or w/c?: No Grab bars in the bathroom?: Yes Shower chair or bench in shower?: Yes Elevated toilet seat or a handicapped toilet?: No  TIMED UP AND GO:  Was the test performed?  No  Cognitive Function: 6CIT completed    11/26/2014    9:32 AM  MMSE - Mini Mental State Exam  Orientation to time 5  Orientation to Place 5  Registration 3  Attention/ Calculation 5  Recall 3  Language- name 2 objects 2  Language- repeat 1  Language- follow 3 step command 3  Language- read & follow direction 1  Write a sentence 1  Copy design 1  Total score 30        04/23/2023    2:19 PM 05/19/2022    1:09 PM 05/02/2019   10:50 AM 05/01/2018   10:56 AM  6CIT Screen  What Year? 0 points 0 points 0 points 0 points  What month? 0 points 0 points 0 points 0 points  What time? 0 points 0 points  0 points  Count back from 20 0 points 0 points 0 points 0 points  Months in reverse 0 points 0 points  0 points  Repeat phrase 0 points 0 points  0 points  Total Score 0 points 0 points  0 points    Immunizations Immunization History  Administered Date(s) Administered   Fluad Quad(high Dose 65+) 09/19/2019, 10/28/2020   Fluad Trivalent(High Dose 65+) 09/29/2022   Influenza Split 10/26/2010, 10/16/2011, 10/21/2012, 08/22/2013   Influenza, High Dose Seasonal PF 10/18/2016, 10/14/2018, 10/17/2021   Influenza-Unspecified 10/08/2014, 10/20/2015, 10/24/2017   PFIZER(Purple Top)SARS-COV-2 Vaccination 03/02/2019, 04/01/2019, 11/13/2019   Pneumococcal Conjugate-13 11/26/2014   Pneumococcal Polysaccharide-23 12/07/2015   Tdap 02/24/2015   Zoster  Recombinant(Shingrix) 03/08/2020, 07/20/2020   Zoster, Live 04/10/2013    Screening Tests Health Maintenance  Topic Date Due   COVID-19 Vaccine (4 - 2024-25 season) 05/08/2024 (Originally 09/17/2022)   INFLUENZA VACCINE  08/17/2023   MAMMOGRAM  09/05/2023   Medicare Annual Wellness (AWV)  04/22/2024   DTaP/Tdap/Td (2 - Td or Tdap) 02/23/2025   Pneumonia Vaccine 87+ Years old  Completed   DEXA SCAN  Completed   Hepatitis C Screening  Completed   Zoster Vaccines- Shingrix  Completed   HPV VACCINES  Aged Out   Colonoscopy  Discontinued    Health Maintenance  There are no preventive care reminders to display for this patient.  Health Maintenance Items Addressed: N/a  Additional Screening:  Vision Screening: Recommended annual ophthalmology exams for early detection of glaucoma and other disorders  of the eye.  Dental Screening: Recommended annual dental exams for proper oral hygiene  Community Resource Referral / Chronic Care Management: CRR required this visit?  No   CCM required this visit?  No     Plan:     I have personally reviewed and noted the following in the patient's chart:   Medical and social history Use of alcohol, tobacco or illicit drugs  Current medications and supplements including opioid prescriptions. Patient is not currently taking opioid prescriptions. Functional ability and status Nutritional status Physical activity Advanced directives List of other physicians Hospitalizations, surgeries, and ER visits in previous 12 months Vitals Screenings to include cognitive, depression, and falls Referrals and appointments  In addition, I have reviewed and discussed with patient certain preventive protocols, quality metrics, and best practice recommendations. A written personalized care plan for preventive services as well as general preventive health recommendations were provided to patient.     Arta Silence, CMA   04/23/2023   After Visit Summary:  (MyChart) Due to this being a telephonic visit, the after visit summary with patients personalized plan was offered to patient via MyChart   Notes: Nothing significant to report at this time.

## 2023-04-23 NOTE — Patient Instructions (Signed)
 [name] , Thank you for taking time to come for your Medicare Wellness Visit. I appreciate your ongoing commitment to your health goals. Please review the following plan we discussed and let me know if I can assist you in the future.   Referrals/Orders/Follow-Ups/Clinician Recommendations: n/a  This is a list of the screening recommended for you and due dates:  Health Maintenance  Topic Date Due   COVID-19 Vaccine (4 - 2024-25 season) 05/08/2024*   Flu Shot  08/17/2023   Mammogram  09/05/2023   Medicare Annual Wellness Visit  04/22/2024   DTaP/Tdap/Td vaccine (2 - Td or Tdap) 02/23/2025   Pneumonia Vaccine  Completed   DEXA scan (bone density measurement)  Completed   Hepatitis C Screening  Completed   Zoster (Shingles) Vaccine  Completed   HPV Vaccine  Aged Out   Colon Cancer Screening  Discontinued  *Topic was postponed. The date shown is not the original due date.    Advanced directives: (Declined) Advance directive discussed with you today. Even though you declined this today, please call our office should you change your mind, and we can give you the proper paperwork for you to fill out.  Next Medicare Annual Wellness Visit scheduled for next year: Yes

## 2023-05-15 DIAGNOSIS — K080 Exfoliation of teeth due to systemic causes: Secondary | ICD-10-CM | POA: Diagnosis not present

## 2023-05-15 DIAGNOSIS — G4733 Obstructive sleep apnea (adult) (pediatric): Secondary | ICD-10-CM | POA: Diagnosis not present

## 2023-05-25 ENCOUNTER — Encounter (HOSPITAL_COMMUNITY): Payer: Self-pay

## 2023-05-30 DIAGNOSIS — D2261 Melanocytic nevi of right upper limb, including shoulder: Secondary | ICD-10-CM | POA: Diagnosis not present

## 2023-05-30 DIAGNOSIS — Z85828 Personal history of other malignant neoplasm of skin: Secondary | ICD-10-CM | POA: Diagnosis not present

## 2023-05-30 DIAGNOSIS — D225 Melanocytic nevi of trunk: Secondary | ICD-10-CM | POA: Diagnosis not present

## 2023-05-30 DIAGNOSIS — D2262 Melanocytic nevi of left upper limb, including shoulder: Secondary | ICD-10-CM | POA: Diagnosis not present

## 2023-05-31 ENCOUNTER — Other Ambulatory Visit: Payer: Self-pay

## 2023-05-31 DIAGNOSIS — D649 Anemia, unspecified: Secondary | ICD-10-CM

## 2023-05-31 DIAGNOSIS — E7800 Pure hypercholesterolemia, unspecified: Secondary | ICD-10-CM

## 2023-05-31 DIAGNOSIS — R739 Hyperglycemia, unspecified: Secondary | ICD-10-CM

## 2023-06-01 ENCOUNTER — Other Ambulatory Visit (INDEPENDENT_AMBULATORY_CARE_PROVIDER_SITE_OTHER)

## 2023-06-01 ENCOUNTER — Ambulatory Visit (INDEPENDENT_AMBULATORY_CARE_PROVIDER_SITE_OTHER)

## 2023-06-01 ENCOUNTER — Ambulatory Visit (INDEPENDENT_AMBULATORY_CARE_PROVIDER_SITE_OTHER): Payer: Medicare Other | Admitting: Internal Medicine

## 2023-06-01 ENCOUNTER — Encounter: Payer: Self-pay | Admitting: Internal Medicine

## 2023-06-01 VITALS — BP 120/58 | HR 54 | Temp 97.8°F | Ht 65.0 in | Wt 133.6 lb

## 2023-06-01 DIAGNOSIS — E7800 Pure hypercholesterolemia, unspecified: Secondary | ICD-10-CM

## 2023-06-01 DIAGNOSIS — R739 Hyperglycemia, unspecified: Secondary | ICD-10-CM

## 2023-06-01 DIAGNOSIS — R252 Cramp and spasm: Secondary | ICD-10-CM | POA: Diagnosis not present

## 2023-06-01 DIAGNOSIS — D649 Anemia, unspecified: Secondary | ICD-10-CM

## 2023-06-01 DIAGNOSIS — I720 Aneurysm of carotid artery: Secondary | ICD-10-CM

## 2023-06-01 DIAGNOSIS — I471 Supraventricular tachycardia, unspecified: Secondary | ICD-10-CM

## 2023-06-01 DIAGNOSIS — G4733 Obstructive sleep apnea (adult) (pediatric): Secondary | ICD-10-CM

## 2023-06-01 LAB — CBC WITH DIFFERENTIAL/PLATELET
Basophils Absolute: 0 10*3/uL (ref 0.0–0.1)
Basophils Relative: 0.8 % (ref 0.0–3.0)
Eosinophils Absolute: 0.2 10*3/uL (ref 0.0–0.7)
Eosinophils Relative: 3.3 % (ref 0.0–5.0)
HCT: 36.5 % (ref 36.0–46.0)
Hemoglobin: 12.3 g/dL (ref 12.0–15.0)
Lymphocytes Relative: 27.6 % (ref 12.0–46.0)
Lymphs Abs: 1.5 10*3/uL (ref 0.7–4.0)
MCHC: 33.5 g/dL (ref 30.0–36.0)
MCV: 88.3 fl (ref 78.0–100.0)
Monocytes Absolute: 0.4 10*3/uL (ref 0.1–1.0)
Monocytes Relative: 7.8 % (ref 3.0–12.0)
Neutro Abs: 3.3 10*3/uL (ref 1.4–7.7)
Neutrophils Relative %: 60.5 % (ref 43.0–77.0)
Platelets: 151 10*3/uL (ref 150.0–400.0)
RBC: 4.14 Mil/uL (ref 3.87–5.11)
RDW: 13.5 % (ref 11.5–15.5)
WBC: 5.5 10*3/uL (ref 4.0–10.5)

## 2023-06-01 LAB — HEPATIC FUNCTION PANEL
ALT: 13 U/L (ref 0–35)
AST: 23 U/L (ref 0–37)
Albumin: 4.5 g/dL (ref 3.5–5.2)
Alkaline Phosphatase: 62 U/L (ref 39–117)
Bilirubin, Direct: 0.2 mg/dL (ref 0.0–0.3)
Total Bilirubin: 1.1 mg/dL (ref 0.2–1.2)
Total Protein: 7.4 g/dL (ref 6.0–8.3)

## 2023-06-01 LAB — BASIC METABOLIC PANEL WITH GFR
BUN: 20 mg/dL (ref 6–23)
CO2: 29 meq/L (ref 19–32)
Calcium: 9.4 mg/dL (ref 8.4–10.5)
Chloride: 103 meq/L (ref 96–112)
Creatinine, Ser: 0.91 mg/dL (ref 0.40–1.20)
GFR: 61.21 mL/min (ref >60.00)
Glucose, Bld: 101 mg/dL — ABNORMAL HIGH (ref 70–99)
Potassium: 4.4 meq/L (ref 3.5–5.1)
Sodium: 139 meq/L (ref 135–145)

## 2023-06-01 LAB — LIPID PANEL
Cholesterol: 147 mg/dL (ref 0–200)
HDL: 44.7 mg/dL (ref >39.00)
LDL Cholesterol: 78 mg/dL (ref 0–99)
NonHDL: 102.55
Total CHOL/HDL Ratio: 3
Triglycerides: 122 mg/dL (ref 0.0–149.0)
VLDL: 24.4 mg/dL (ref 0.0–40.0)

## 2023-06-01 LAB — HEMOGLOBIN A1C: Hgb A1c MFr Bld: 5.6 % (ref 4.6–6.5)

## 2023-06-01 LAB — MAGNESIUM: Magnesium: 2.2 mg/dL (ref 1.5–2.5)

## 2023-06-01 NOTE — Progress Notes (Signed)
 Subjective:    Patient ID: Dawn Wolfe, female    DOB: [birth year], 78 y.o.   MRN: [id number]  Patient here for  Chief Complaint  Patient presents with   Medical Management of Chronic Issues    4 mth f/u    HPI Here for a scheduled follow up. Saw cardiology 08/18/22 - no recurrence. F/u prn. Saw neurology - evaluated of recent blurred vision, etc. Negative MRI brain and MRA. Recommended continuing aspirin 81mg  daily and continue pravastatin . 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 Had f/u with Dr Seabron Cypress 03/14/23 - f/u lung nodule. Presenting with a 10 mm LLL solid nodule and similarly a ground glass nodule in the RLL. The nodule is not spiculated, with no associated lymphadenopathy and is not PET avid on CT. The LLL nodule exhibits 1.4% risk of being malignant based on the [facility] SPN risk score calculator. She is ECOG 0. Both nodules have been stable on multiple repeat chest CT's. Recommended f/u CT in one year. Stays active. No increased chest pain or sob reported with increased activity or exertion. Did report recent episode of dizziness. Episode lasted a couple of minutes. Also reports increased cramps - toes. May occur 2-3x/week. Taking magnesium.    Past Medical History:  Diagnosis Date   Anemia    h/o as a child   Arthritis    left shoulder   Cancer (HCC) 15 yrs ago   [employer] on the face. Surgical removal   Complication of anesthesia    Hemorrhoids    History of colonoscopy 2003   Normal, Dr. Toribio Frees, [place]    Hypercholesterolemia    Normal exercise sestamibi stress test 2008   Ken Fath   Obstructive sleep apnea on CPAP 2007   PONV (postoperative nausea and vomiting)    Rotator cuff tear, left    Sleep apnea    Past Surgical History:  Procedure Laterality Date    ABDOMINALPLASTY W/ LIPOSUCTION  AGE 84   BUNIONECTOMY  X3  LAST ONE 2003   CHOLECYSTECTOMY N/A 04/19/2015   Procedure: LAPAROSCOPIC CHOLECYSTECTOMY WITH INTRAOPERATIVE CHOLANGIOGRAM;  Surgeon: Marshall Skeeter, MD;  Location: [facility] ORS;  Service: General;  Laterality: N/A;   COLONOSCOPY  2012   Dr Rheba Cedar in [place]   COLONOSCOPY WITH PROPOFOL  N/A 09/11/2016   Procedure: COLONOSCOPY WITH PROPOFOL ;  Surgeon: Deveron Fly, MD;  Location: [facility] ENDOSCOPY;  Service: Endoscopy;  Laterality: N/A;   ERCP N/A 04/29/2015   Procedure: ENDOSCOPIC RETROGRADE CHOLANGIOPANCREATOGRAPHY (ERCP);  Surgeon: Stephens Eis, MD;  Location: [facility] ENDOSCOPY;  Service: Gastroenterology;  Laterality: N/A;   EYE SURGERY     cataract extraction   NASAL SEPTUM SURGERY  AGE 33   NASAL SINUS SURGERY  AGE 58   SHOULDER ARTHROSCOPY  10/25/2011   Procedure: ARTHROSCOPY SHOULDER;  Surgeon: Verlinda Gloss, MD;  Location: [facility];  Service: Orthopedics;  Laterality: Left;  with SAD, shave of the labrium    TUBAL LIGATION  1982   Family History  Problem Relation Age of Onset   Acute myelogenous leukemia Sister    Polycythemia Sister 63   Emphysema Father        smoker   Heart disease Father        myocardial infarction - 68   COPD Father  Heart attack Father    Breast cancer Paternal Aunt    Testicular cancer Brother    Heart disease Mother    Emphysema Mother    COPD Mother    Asthma Mother    Heart attack Mother    Leukemia Sister    Colon cancer Neg Hx    Social History   Socioeconomic History   Marital status: Married    Spouse name: Not on file   Number of children: Not on file   Years of education: Not on file   Highest education level: Not on file  Occupational History   Not on file  Tobacco Use   Smoking status: Former    Current packs/day: 0.00    Average packs/day: 2.5 packs/day for 10.0 years (25.0 ttl pk-yrs)    Types: Cigarettes    Start date: 03/29/1967     Quit date: 03/28/1977    Years since quitting: 46.2   Smokeless tobacco: Never  Substance and Sexual Activity   Alcohol use: No    Alcohol/week: 0.0 standard drinks of alcohol   Drug use: No   Sexual activity: Not Currently    Birth control/protection: Post-menopausal  Other Topics Concern   Not on file  Social History Narrative   Not on file   Social Drivers of Health   Financial Resource Strain: Low Risk  (04/23/2023)   Overall Financial Resource Strain (CARDIA)    Difficulty of Paying Living Expenses: Not hard at all  Food Insecurity: No Food Insecurity (04/23/2023)   Hunger Vital Sign    Worried About Running Out of Food in the Last Year: Never true    Ran Out of Food in the Last Year: Never true  Transportation Needs: No Transportation Needs (04/23/2023)   PRAPARE - Administrator, Civil Service (Medical): No    Lack of Transportation (Non-Medical): No  Physical Activity: Insufficiently Active (04/23/2023)   Exercise Vital Sign    Days of Exercise per Week: 3 days    Minutes of Exercise per Session: 40 min  Stress: No Stress Concern Present (04/23/2023)   [employer] of Occupational Health - Occupational Stress Questionnaire    Feeling of Stress : Not at all  Social Connections: Socially Integrated (04/23/2023)   Social Connection and Isolation Panel [NHANES]    Frequency of Communication with Friends and Family: More than three times a week    Frequency of Social Gatherings with Friends and Family: More than three times a week    Attends Religious Services: More than 4 times per year    Active Member of [employer] or Organizations: Yes    Attends Banker Meetings: Never    Marital Status: Married     Review of Systems  Constitutional:  Negative for appetite change and unexpected weight change.  HENT:  Negative for congestion and sinus pressure.   Respiratory:  Negative for cough, chest tightness and shortness of breath.   Cardiovascular:  Negative  for chest pain, palpitations and leg swelling.  Gastrointestinal:  Negative for abdominal pain, diarrhea, nausea and vomiting.  Genitourinary:  Negative for difficulty urinating and dysuria.  Musculoskeletal:  Negative for joint swelling and myalgias.  Skin:  Negative for color change and rash.  Neurological:  Negative for headaches.       Episode - dizziness as outlined.   Psychiatric/Behavioral:  Negative for agitation and dysphoric mood.        Objective:     BP (!) 120/58  Pulse (!) 54   Temp 97.8 F (36.6 C)   Ht 5\' 5"  (1.651 m)   Wt 133 lb 9.6 oz (60.6 kg)   SpO2 97%   BMI 22.23 kg/m  Wt Readings from Last 3 Encounters:  06/01/23 133 lb 9.6 oz (60.6 kg)  04/23/23 138 lb (62.6 kg)  03/14/23 138 lb (62.6 kg)    Physical Exam Vitals reviewed.  Constitutional:      General: She is not in acute distress.    Appearance: Normal appearance.  HENT:     Head: Normocephalic and atraumatic.     Right Ear: External ear normal.     Left Ear: External ear normal.     Mouth/Throat:     Pharynx: No oropharyngeal exudate or posterior oropharyngeal erythema.  Eyes:     General: No scleral icterus.       Right eye: No discharge.        Left eye: No discharge.     Conjunctiva/sclera: Conjunctivae normal.  Neck:     Thyroid : No thyromegaly.  Cardiovascular:     Rate and Rhythm: Normal rate and regular rhythm.  Pulmonary:     Effort: No respiratory distress.     Breath sounds: Normal breath sounds. No wheezing.  Abdominal:     General: Bowel sounds are normal.     Palpations: Abdomen is soft.     Tenderness: There is no abdominal tenderness.  Musculoskeletal:        General: No swelling or tenderness.     Cervical back: Neck supple. No tenderness.  Lymphadenopathy:     Cervical: No cervical adenopathy.  Skin:    Findings: No erythema or rash.  Neurological:     Mental Status: She is alert.  Psychiatric:        Mood and Affect: Mood normal.        Behavior: Behavior  normal.         Outpatient Encounter Medications as of 06/01/2023  Medication Sig   acidophilus (RISAQUAD) CAPS capsule Take 1 capsule by mouth daily.   aspirin EC 81 MG tablet Take 81 mg by mouth daily.   Cyanocobalamin  (VITAMIN B-12) 1000 MCG/15ML LIQD Take by mouth. 1 injection monthly   latanoprost (XALATAN) 0.005 % ophthalmic solution Place 1 drop into the left eye.   magnesium gluconate (MAGONATE) 500 MG tablet Take 1,000 mg by mouth daily.   Multiple Vitamin (MULTIVITAMIN) tablet Take 1 tablet by mouth daily.   POTASSIUM PO Take by mouth.   pravastatin  (PRAVACHOL ) 20 MG tablet Take 1 tablet (20 mg total) by mouth daily.   [DISCONTINUED] saccharomyces boulardii (FLORASTOR) 250 MG capsule Take by mouth. (Patient not taking: Reported on 06/01/2023)   No facility-administered encounter medications on file as of 06/01/2023.     Lab Results  Component Value Date   WBC 5.5 06/01/2023   HGB 12.3 06/01/2023   HCT 36.5 06/01/2023   PLT 151.0 06/01/2023   GLUCOSE 101 (H) 06/01/2023   CHOL 147 06/01/2023   TRIG 122.0 06/01/2023   HDL 44.70 06/01/2023   LDLDIRECT 70.0 02/22/2022   LDLCALC 78 06/01/2023   ALT 13 06/01/2023   AST 23 06/01/2023   NA 139 06/01/2023   K 4.4 06/01/2023   CL 103 06/01/2023   CREATININE 0.91 06/01/2023   BUN 20 06/01/2023   CO2 29 06/01/2023   TSH 1.90 01/31/2023   HGBA1C 5.6 06/01/2023    CT CHEST WO CONTRAST Result Date: 02/03/2023 CLINICAL DATA:  Follow-up pulmonary  nodule EXAM: CT CHEST WITHOUT CONTRAST TECHNIQUE: Multidetector CT imaging of the chest was performed following the standard protocol without IV contrast. RADIATION DOSE REDUCTION: This exam was performed according to the departmental dose-optimization program which includes automated exposure control, adjustment of the mA and/or kV according to patient size and/or use of iterative reconstruction technique. COMPARISON:  07/24/2022, 10/13/2021 FINDINGS: Cardiovascular: Heart is normal  size. Aorta is normal caliber. Coronary artery and aortic calcifications. Mediastinum/Nodes: No mediastinal, hilar, or axillary adenopathy. Trachea and esophagus are unremarkable. Thyroid  unremarkable. Lungs/Pleura: Biapical scarring. Bilateral lower lobe pulmonary nodules are again noted. Left lower lobe, 10 x 10 mm image 108/3, stable. Right lower lobe nodule, 11 x 5 mm image 97/3, stable. Right lower lobe, 3 mm image 123/3, stable. No new or enlarging pulmonary nodules. No effusions. Scarring in the right middle lobe and lingula, stable. Upper Abdomen: No acute findings Musculoskeletal: Chest wall soft tissues are unremarkable. No acute bony abnormality. IMPRESSION: Stable bilateral lower lobe pulmonary nodules dating back to 09/23/2021. These could be followed one additional time in 1 year to ensure 2 years stability. Biapical and bibasilar scarring. Coronary artery disease. Aortic Atherosclerosis (ICD10-I70.0). Electronically Signed   By: Janeece Mechanic M.D.   On: 02/03/2023 21:09       Assessment & Plan:  Leg cramps Assessment & Plan: Describes muscle cramps as outined.  Taking magnesium. Check magnesium level. Stretches. Follow. Stay hydrated.   Orders: -     Vitamin B12; Future -     Magnesium; Future  Hypercholesteremia Assessment & Plan: On pravastatin .  Low cholesterol diet and exercise.  Follow lipid panel.  Lab Results  Component Value Date   CHOL 147 06/01/2023   HDL 44.70 06/01/2023   LDLCALC 78 06/01/2023   LDLDIRECT 70.0 02/22/2022   TRIG 122.0 06/01/2023   CHOLHDL 3 06/01/2023     Hyperglycemia Assessment & Plan: Monitors her diet. Follow met b and A1c.   Lab Results  Component Value Date   HGBA1C 5.6 06/01/2023      Anemia, unspecified type Assessment & Plan: Recent hgb wnl.    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  infundibulum  of right upper carotid siphon unlikely to be an aneurysm. D/w neurology regarding if any f/u scanning warranted.    Obstructive sleep apnea Assessment & Plan: CPAP.    SVT (supraventricular tachycardia) (HCC) Assessment & Plan: S/p catheter ablation (SVT) 05/31/22.  Off metoprolol . Has done well since ablation.  Overall feels stable. Follow.       Dellar Fenton, MD

## 2023-06-02 ENCOUNTER — Ambulatory Visit: Payer: Self-pay | Admitting: Internal Medicine

## 2023-06-05 ENCOUNTER — Ambulatory Visit: Payer: Self-pay | Admitting: Internal Medicine

## 2023-06-05 LAB — VITAMIN B12: Vitamin B-12: 357 pg/mL (ref 211–911)

## 2023-06-11 ENCOUNTER — Encounter: Payer: Self-pay | Admitting: Internal Medicine

## 2023-06-11 NOTE — Assessment & Plan Note (Signed)
 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  Off metoprolol . Has done well since ablation.  Overall feels stable. Follow.

## 2023-06-11 NOTE — Assessment & Plan Note (Signed)
 On pravastatin .  Low cholesterol diet and exercise.  Follow lipid panel.  Lab Results  Component Value Date   CHOL 147 06/01/2023   HDL 44.70 06/01/2023   LDLCALC 78 06/01/2023   LDLDIRECT 70.0 02/22/2022   TRIG 122.0 06/01/2023   CHOLHDL 3 06/01/2023

## 2023-06-11 NOTE — Assessment & Plan Note (Addendum)
 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  infundibulum of right upper carotid siphon unlikely to be an aneurysm. D/w neurology regarding if any f/u scanning warranted.

## 2023-06-11 NOTE — Assessment & Plan Note (Addendum)
 Describes muscle cramps as outined.  Taking magnesium. Check magnesium level. Stretches. Follow. Stay hydrated.

## 2023-06-11 NOTE — Assessment & Plan Note (Signed)
 CPAP.

## 2023-06-11 NOTE — Assessment & Plan Note (Signed)
 Monitors her diet. Follow met b and A1c.   Lab Results  Component Value Date   HGBA1C 5.6 06/01/2023

## 2023-06-11 NOTE — Assessment & Plan Note (Signed)
Recent hgb wnl.  

## 2023-06-12 DIAGNOSIS — M9902 Segmental and somatic dysfunction of thoracic region: Secondary | ICD-10-CM | POA: Diagnosis not present

## 2023-06-12 DIAGNOSIS — M62830 Muscle spasm of back: Secondary | ICD-10-CM | POA: Diagnosis not present

## 2023-06-12 DIAGNOSIS — M9901 Segmental and somatic dysfunction of cervical region: Secondary | ICD-10-CM | POA: Diagnosis not present

## 2023-06-12 DIAGNOSIS — M542 Cervicalgia: Secondary | ICD-10-CM | POA: Diagnosis not present

## 2023-06-13 DIAGNOSIS — M62830 Muscle spasm of back: Secondary | ICD-10-CM | POA: Diagnosis not present

## 2023-06-13 DIAGNOSIS — M542 Cervicalgia: Secondary | ICD-10-CM | POA: Diagnosis not present

## 2023-06-13 DIAGNOSIS — M9901 Segmental and somatic dysfunction of cervical region: Secondary | ICD-10-CM | POA: Diagnosis not present

## 2023-06-13 DIAGNOSIS — M9902 Segmental and somatic dysfunction of thoracic region: Secondary | ICD-10-CM | POA: Diagnosis not present

## 2023-06-14 DIAGNOSIS — G4733 Obstructive sleep apnea (adult) (pediatric): Secondary | ICD-10-CM | POA: Diagnosis not present

## 2023-06-15 ENCOUNTER — Telehealth: Payer: Self-pay | Admitting: Internal Medicine

## 2023-06-15 DIAGNOSIS — M62830 Muscle spasm of back: Secondary | ICD-10-CM | POA: Diagnosis not present

## 2023-06-15 DIAGNOSIS — M9901 Segmental and somatic dysfunction of cervical region: Secondary | ICD-10-CM | POA: Diagnosis not present

## 2023-06-15 DIAGNOSIS — M542 Cervicalgia: Secondary | ICD-10-CM | POA: Diagnosis not present

## 2023-06-15 DIAGNOSIS — M9902 Segmental and somatic dysfunction of thoracic region: Secondary | ICD-10-CM | POA: Diagnosis not present

## 2023-06-15 NOTE — Telephone Encounter (Signed)
 Dr Geralyn Knee will not be in the office the day of your scheduled visit 08/02/23 . Please give the office a call at [phone] and we will get you rescheduled.  Thank you  LMOM and sent above message via MyChart. E2C2, please reschedule pt when they call back -[facility]

## 2023-06-18 DIAGNOSIS — M9902 Segmental and somatic dysfunction of thoracic region: Secondary | ICD-10-CM | POA: Diagnosis not present

## 2023-06-18 DIAGNOSIS — M542 Cervicalgia: Secondary | ICD-10-CM | POA: Diagnosis not present

## 2023-06-18 DIAGNOSIS — M9901 Segmental and somatic dysfunction of cervical region: Secondary | ICD-10-CM | POA: Diagnosis not present

## 2023-06-18 DIAGNOSIS — M62830 Muscle spasm of back: Secondary | ICD-10-CM | POA: Diagnosis not present

## 2023-06-19 ENCOUNTER — Telehealth: Payer: Self-pay | Admitting: Internal Medicine

## 2023-06-19 NOTE — Telephone Encounter (Signed)
 Please notify Dawn Wolfe that I contacted neurology for f/u regarding her aneurysm. They recommended a f/u and informed me to have her call and schedule.  Thanks

## 2023-06-21 DIAGNOSIS — R000 Tachycardia, unspecified: Secondary | ICD-10-CM | POA: Diagnosis not present

## 2023-06-21 DIAGNOSIS — R030 Elevated blood-pressure reading, without diagnosis of hypertension: Secondary | ICD-10-CM | POA: Diagnosis not present

## 2023-06-21 DIAGNOSIS — Z9889 Other specified postprocedural states: Secondary | ICD-10-CM | POA: Diagnosis not present

## 2023-06-21 DIAGNOSIS — I5032 Chronic diastolic (congestive) heart failure: Secondary | ICD-10-CM | POA: Diagnosis not present

## 2023-06-22 NOTE — Telephone Encounter (Signed)
 Patient return call and made aware of provider message

## 2023-06-22 NOTE — Telephone Encounter (Signed)
 LM for patient. Please relay message below.

## 2023-06-22 NOTE — Telephone Encounter (Signed)
 Noted

## 2023-06-25 ENCOUNTER — Telehealth: Payer: Self-pay

## 2023-06-25 NOTE — Telephone Encounter (Unsigned)
 Copied from CRM [id number]. Topic: Clinical - Medical Advice >> Jun 25, 2023  8:17 AM Albertha Alosa wrote: Reason for CRM: Patient called in regarding a referral to the Gastrologist, I asked patient if she has spoken to Dr.Scott regarding this she stated she hasn't has the issues just started a week ago, would like for Dr.Scott or nurses to give her a callback on what she should do

## 2023-06-25 NOTE — Telephone Encounter (Signed)
 Copied from CRM [id number]. Topic: Clinical - Medical Advice >> Jun 25, 2023  8:17 AM Albertha Alosa wrote: Reason for CRM: Patient called in regarding a referral to the Gastrologist, I asked patient if she has spoken to Dr.Scott regarding this she stated she hasn't has the issues just started a week ago, would like for Dr.Scott or nurses to give her a callback on what she should do

## 2023-06-27 DIAGNOSIS — G4733 Obstructive sleep apnea (adult) (pediatric): Secondary | ICD-10-CM | POA: Diagnosis not present

## 2023-06-27 DIAGNOSIS — I720 Aneurysm of carotid artery: Secondary | ICD-10-CM | POA: Diagnosis not present

## 2023-06-27 DIAGNOSIS — Z1331 Encounter for screening for depression: Secondary | ICD-10-CM | POA: Diagnosis not present

## 2023-06-27 DIAGNOSIS — F40240 Claustrophobia: Secondary | ICD-10-CM | POA: Diagnosis not present

## 2023-06-28 NOTE — Telephone Encounter (Signed)
 LM for patient to clarify. Is she wanting a referral to a gastroenterologist? If so, need reason for referral.

## 2023-07-05 DIAGNOSIS — H524 Presbyopia: Secondary | ICD-10-CM | POA: Diagnosis not present

## 2023-07-05 DIAGNOSIS — H40153 Residual stage of open-angle glaucoma, bilateral: Secondary | ICD-10-CM | POA: Diagnosis not present

## 2023-07-06 NOTE — Telephone Encounter (Signed)
 Patient states she is fine now apparently she had a bad bout with diverticulitis and she is okay now.

## 2023-07-15 DIAGNOSIS — G4733 Obstructive sleep apnea (adult) (pediatric): Secondary | ICD-10-CM | POA: Diagnosis not present

## 2023-07-17 DIAGNOSIS — H409 Unspecified glaucoma: Secondary | ICD-10-CM | POA: Diagnosis not present

## 2023-07-27 DIAGNOSIS — M9901 Segmental and somatic dysfunction of cervical region: Secondary | ICD-10-CM | POA: Diagnosis not present

## 2023-07-27 DIAGNOSIS — M542 Cervicalgia: Secondary | ICD-10-CM | POA: Diagnosis not present

## 2023-07-27 DIAGNOSIS — M62830 Muscle spasm of back: Secondary | ICD-10-CM | POA: Diagnosis not present

## 2023-07-27 DIAGNOSIS — M9902 Segmental and somatic dysfunction of thoracic region: Secondary | ICD-10-CM | POA: Diagnosis not present

## 2023-08-02 ENCOUNTER — Ambulatory Visit: Admitting: Internal Medicine

## 2023-08-03 DIAGNOSIS — M542 Cervicalgia: Secondary | ICD-10-CM | POA: Diagnosis not present

## 2023-08-03 DIAGNOSIS — M9901 Segmental and somatic dysfunction of cervical region: Secondary | ICD-10-CM | POA: Diagnosis not present

## 2023-08-03 DIAGNOSIS — M9902 Segmental and somatic dysfunction of thoracic region: Secondary | ICD-10-CM | POA: Diagnosis not present

## 2023-08-03 DIAGNOSIS — M62830 Muscle spasm of back: Secondary | ICD-10-CM | POA: Diagnosis not present

## 2023-08-10 DIAGNOSIS — M9902 Segmental and somatic dysfunction of thoracic region: Secondary | ICD-10-CM | POA: Diagnosis not present

## 2023-08-10 DIAGNOSIS — M542 Cervicalgia: Secondary | ICD-10-CM | POA: Diagnosis not present

## 2023-08-10 DIAGNOSIS — M9901 Segmental and somatic dysfunction of cervical region: Secondary | ICD-10-CM | POA: Diagnosis not present

## 2023-08-10 DIAGNOSIS — M62830 Muscle spasm of back: Secondary | ICD-10-CM | POA: Diagnosis not present

## 2023-08-15 ENCOUNTER — Ambulatory Visit (INDEPENDENT_AMBULATORY_CARE_PROVIDER_SITE_OTHER): Admitting: Internal Medicine

## 2023-08-15 VITALS — BP 114/70 | HR 90 | Temp 98.9°F | Resp 16 | Ht 65.0 in | Wt 129.0 lb

## 2023-08-15 DIAGNOSIS — I720 Aneurysm of carotid artery: Secondary | ICD-10-CM | POA: Diagnosis not present

## 2023-08-15 DIAGNOSIS — R911 Solitary pulmonary nodule: Secondary | ICD-10-CM

## 2023-08-15 DIAGNOSIS — E7800 Pure hypercholesterolemia, unspecified: Secondary | ICD-10-CM | POA: Diagnosis not present

## 2023-08-15 DIAGNOSIS — I471 Supraventricular tachycardia, unspecified: Secondary | ICD-10-CM

## 2023-08-15 DIAGNOSIS — R748 Abnormal levels of other serum enzymes: Secondary | ICD-10-CM

## 2023-08-15 DIAGNOSIS — R739 Hyperglycemia, unspecified: Secondary | ICD-10-CM | POA: Diagnosis not present

## 2023-08-15 DIAGNOSIS — G4733 Obstructive sleep apnea (adult) (pediatric): Secondary | ICD-10-CM

## 2023-08-15 DIAGNOSIS — R059 Cough, unspecified: Secondary | ICD-10-CM

## 2023-08-15 MED ORDER — PREDNISONE 10 MG PO TABS: ORAL_TABLET | ORAL | 0 refills | Status: DC

## 2023-08-15 MED ORDER — AZITHROMYCIN 250 MG PO TABS: ORAL_TABLET | ORAL | 0 refills | Status: AC

## 2023-08-15 NOTE — Patient Instructions (Addendum)
 Robitussin DM - for cough/congestion.   Continue saline nasal spray

## 2023-08-15 NOTE — Progress Notes (Signed)
 Subjective:    Patient ID: Dawn Wolfe, female    DOB: Jun 11, 1945, 78 y.o.   MRN: [id number]  Patient here for  Chief Complaint  Patient presents with   Medical Management of Chronic Issues    HPI Here for a scheduled follow up.  Saw cardiology 06/21/23  - no recurrence. SVT - stable. Recommended sleep study. F/u prn. Saw neurology - evaluated of recent blurred vision, etc. Negative MRI brain and MRA. Recommended continuing aspirin 81mg  daily and continue pravastatin . 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 Saw neurology in follow up 06/27/23 - stable aneurysm. No further imaging warranted. Had f/u with Dr Rowena 03/14/23 - f/u lung nodule. Presenting with a 10 mm LLL solid nodule and similarly a ground glass nodule in the RLL. The nodule is not spiculated, with no associated lymphadenopathy and is not PET avid on CT. The LLL nodule exhibits 1.4% risk of being malignant based on the [facility] SPN risk score calculator. She is ECOG 0. Both nodules have been stable on multiple repeat chest CT's. Recommended f/u CT in one year. She reports that she had been doing relatively well until several days ago. She developed increased cough and congestion - 08/12/23. Increased fatigue. Cough productive. No fever. Some sore throat. No vomiting. Some coughing fits. No increased sob. Took two aleve - aching. Using cpap.    Past Medical History:  Diagnosis Date   Anemia    h/o as a child   Arthritis    left shoulder   Cancer (HCC) 15 yrs ago   [employer] on the face. Surgical removal   Complication of anesthesia    Hemorrhoids    History of colonoscopy 2003   Normal, Dr. Nolon, [place]    Hypercholesterolemia    Normal exercise sestamibi stress test 2008   Ken Fath   Obstructive sleep apnea on CPAP 2007    PONV (postoperative nausea and vomiting)    Rotator cuff tear, left    Sleep apnea    Past Surgical History:  Procedure Laterality Date   ABDOMINALPLASTY W/ LIPOSUCTION  AGE 47   BUNIONECTOMY  X3  LAST ONE 2003   CHOLECYSTECTOMY N/A 04/19/2015   Procedure: LAPAROSCOPIC CHOLECYSTECTOMY WITH INTRAOPERATIVE CHOLANGIOGRAM;  Surgeon: Reyes LELON Cota, MD;  Location: [facility] ORS;  Service: General;  Laterality: N/A;   COLONOSCOPY  2012   Dr [place]il [place]der in [place]   COLONOSCOPY WITH PROPOFOL  N/A 09/11/2016   Procedure: COLONOSCOPY WITH PROPOFOL ;  Surgeon: Gaylyn Gladis PENNER, MD;  Location: [facility] ENDOSCOPY;  Service: Endoscopy;  Laterality: N/A;   ERCP N/A 04/29/2015   Procedure: ENDOSCOPIC RETROGRADE CHOLANGIOPANCREATOGRAPHY (ERCP);  Surgeon: Deward CINDERELLA Piedmont, MD;  Location: [facility] ENDOSCOPY;  Service: Gastroenterology;  Laterality: N/A;   EYE SURGERY     cataract extraction   NASAL SEPTUM SURGERY  AGE 45   NASAL SINUS SURGERY  AGE 35   SHOULDER ARTHROSCOPY  10/25/2011   Procedure: ARTHROSCOPY SHOULDER;  Surgeon: Lynwood SHAUNNA Bern, MD;  Location: [facility];  Service: Orthopedics;  Laterality: Left;  with SAD, shave of the labrium    TUBAL LIGATION  1982   Family History  Problem Relation Age of Onset   Acute myelogenous leukemia Sister    Polycythemia Sister 26   Emphysema Father  smoker   Heart disease Father        myocardial infarction - 39   COPD Father    Heart attack Father    Breast cancer Paternal Aunt    Testicular cancer Brother    Heart disease Mother    Emphysema Mother    COPD Mother    Asthma Mother    Heart attack Mother    Leukemia Sister    Colon cancer Neg Hx    Social History   Socioeconomic History   Marital status: Married    Spouse name: Not on file   Number of children: Not on file   Years of education: Not on file   Highest education level: Not on file  Occupational History   Not on file  Tobacco Use   Smoking status: Former     Current packs/day: 0.00    Average packs/day: 2.5 packs/day for 10.0 years (25.0 ttl pk-yrs)    Types: Cigarettes    Start date: 03/29/1967    Quit date: 03/28/1977    Years since quitting: 46.4   Smokeless tobacco: Never  Substance and Sexual Activity   Alcohol use: No    Alcohol/week: 0.0 standard drinks of alcohol   Drug use: No   Sexual activity: Not Currently    Birth control/protection: Post-menopausal  Other Topics Concern   Not on file  Social History Narrative   Not on file   Social Drivers of Health   Financial Resource Strain: Low Risk  (04/23/2023)   Overall Financial Resource Strain (CARDIA)    Difficulty of Paying Living Expenses: Not hard at all  Food Insecurity: No Food Insecurity (04/23/2023)   Hunger Vital Sign    Worried About Running Out of Food in the Last Year: Never true    Ran Out of Food in the Last Year: Never true  Transportation Needs: No Transportation Needs (04/23/2023)   PRAPARE - Administrator, Civil Service (Medical): No    Lack of Transportation (Non-Medical): No  Physical Activity: Insufficiently Active (04/23/2023)   Exercise Vital Sign    Days of Exercise per Week: 3 days    Minutes of Exercise per Session: 40 min  Stress: No Stress Concern Present (04/23/2023)   [employer] of Occupational Health - Occupational Stress Questionnaire    Feeling of Stress : Not at all  Social Connections: Socially Integrated (04/23/2023)   Social Connection and Isolation Panel    Frequency of Communication with Friends and Family: More than three times a week    Frequency of Social Gatherings with Friends and Family: More than three times a week    Attends Religious Services: More than 4 times per year    Active Member of [employer] or Organizations: Yes    Attends Banker Meetings: Never    Marital Status: Married     Review of Systems  Constitutional:  Positive for fatigue. Negative for fever.  HENT:  Positive for congestion and sore  throat.   Respiratory:  Positive for cough. Negative for chest tightness and shortness of breath.   Cardiovascular:  Negative for chest pain, palpitations and leg swelling.  Gastrointestinal:  Negative for abdominal pain and vomiting.       No bowel change reported.   Genitourinary:  Negative for difficulty urinating and dysuria.  Musculoskeletal:  Negative for joint swelling.       Some aching.   Skin:  Negative for color change and rash.  Neurological:  Negative for  dizziness and headaches.  Psychiatric/Behavioral:  Negative for agitation and dysphoric mood.        Objective:     BP 114/70   Pulse 90   Temp 98.9 F (37.2 C)   Resp 16   Ht 5' 5 (1.651 m)   Wt 129 lb (58.5 kg)   SpO2 97%   BMI 21.47 kg/m  Wt Readings from Last 3 Encounters:  08/15/23 129 lb (58.5 kg)  06/01/23 133 lb 9.6 oz (60.6 kg)  04/23/23 138 lb (62.6 kg)    Physical Exam Vitals reviewed.  Constitutional:      General: She is not in acute distress.    Appearance: Normal appearance.  HENT:     Head: Normocephalic and atraumatic.     Right Ear: External ear normal.     Left Ear: External ear normal.  Eyes:     General: No scleral icterus.       Right eye: No discharge.        Left eye: No discharge.     Conjunctiva/sclera: Conjunctivae normal.  Neck:     Thyroid : No thyromegaly.  Cardiovascular:     Rate and Rhythm: Normal rate and regular rhythm.  Pulmonary:     Effort: No respiratory distress.     Breath sounds: Normal breath sounds. No wheezing.     Comments: Increased cough with forced expiration.  Abdominal:     General: Bowel sounds are normal.     Palpations: Abdomen is soft.     Tenderness: There is no abdominal tenderness.  Musculoskeletal:        General: No swelling or tenderness.     Cervical back: Neck supple. No tenderness.  Lymphadenopathy:     Cervical: No cervical adenopathy.  Skin:    Findings: No erythema or rash.  Neurological:     Mental Status: She is alert.   Psychiatric:        Mood and Affect: Mood normal.        Behavior: Behavior normal.         Outpatient Encounter Medications as of 08/15/2023  Medication Sig   acidophilus (RISAQUAD) CAPS capsule Take 1 capsule by mouth daily.   aspirin EC 81 MG tablet Take 81 mg by mouth daily.   azithromycin  (ZITHROMAX ) 250 MG tablet Take 2 tablets on day 1, then 1 tablet daily on days 2 through 5   Coenzyme Q10 (COQ10 PO) Take by mouth daily.   Cyanocobalamin  (VITAMIN B-12) 1000 MCG/15ML LIQD Take by mouth. 1 injection monthly   latanoprost (XALATAN) 0.005 % ophthalmic solution Place 1 drop into the left eye.   magnesium gluconate (MAGONATE) 500 MG tablet Take 1,000 mg by mouth daily.   Multiple Vitamin (MULTIVITAMIN) tablet Take 1 tablet by mouth daily.   POTASSIUM PO Take by mouth.   pravastatin  (PRAVACHOL ) 20 MG tablet Take 1 tablet (20 mg total) by mouth daily.   predniSONE  (DELTASONE ) 10 MG tablet Take 6 tablets x 1 day and then decrease by 1/2 tablet per day until down to zero mg.   No facility-administered encounter medications on file as of 08/15/2023.     Lab Results  Component Value Date   WBC 5.5 06/01/2023   HGB 12.3 06/01/2023   HCT 36.5 06/01/2023   PLT 151.0 06/01/2023   GLUCOSE 101 (H) 06/01/2023   CHOL 147 06/01/2023   TRIG 122.0 06/01/2023   HDL 44.70 06/01/2023   LDLDIRECT 70.0 02/22/2022   LDLCALC 78 06/01/2023   ALT  13 06/01/2023   AST 23 06/01/2023   NA 139 06/01/2023   K 4.4 06/01/2023   CL 103 06/01/2023   CREATININE 0.91 06/01/2023   BUN 20 06/01/2023   CO2 29 06/01/2023   TSH 1.90 01/31/2023   HGBA1C 5.6 06/01/2023    CT CHEST WO CONTRAST Result Date: 02/03/2023 CLINICAL DATA:  Follow-up pulmonary nodule EXAM: CT CHEST WITHOUT CONTRAST TECHNIQUE: Multidetector CT imaging of the chest was performed following the standard protocol without IV contrast. RADIATION DOSE REDUCTION: This exam was performed according to the departmental dose-optimization program  which includes automated exposure control, adjustment of the mA and/or kV according to patient size and/or use of iterative reconstruction technique. COMPARISON:  07/24/2022, 10/13/2021 FINDINGS: Cardiovascular: Heart is normal size. Aorta is normal caliber. Coronary artery and aortic calcifications. Mediastinum/Nodes: No mediastinal, hilar, or axillary adenopathy. Trachea and esophagus are unremarkable. Thyroid  unremarkable. Lungs/Pleura: Biapical scarring. Bilateral lower lobe pulmonary nodules are again noted. Left lower lobe, 10 x 10 mm image 108/3, stable. Right lower lobe nodule, 11 x 5 mm image 97/3, stable. Right lower lobe, 3 mm image 123/3, stable. No new or enlarging pulmonary nodules. No effusions. Scarring in the right middle lobe and lingula, stable. Upper Abdomen: No acute findings Musculoskeletal: Chest wall soft tissues are unremarkable. No acute bony abnormality. IMPRESSION: Stable bilateral lower lobe pulmonary nodules dating back to 09/23/2021. These could be followed one additional time in 1 year to ensure 2 years stability. Biapical and bibasilar scarring. Coronary artery disease. Aortic Atherosclerosis (ICD10-I70.0). Electronically Signed   By: Franky Crease M.D.   On: 02/03/2023 21:09       Assessment & Plan:  Abnormal liver enzymes Assessment & Plan: Previous abdominal ultrasound - changes c/w fatty liver.  Follow liver function tests.     Hypercholesteremia Assessment & Plan: On pravastatin .  Low cholesterol diet and exercise.  Follow lipid panel.  Lab Results  Component Value Date   CHOL 147 06/01/2023   HDL 44.70 06/01/2023   LDLCALC 78 06/01/2023   LDLDIRECT 70.0 02/22/2022   TRIG 122.0 06/01/2023   CHOLHDL 3 06/01/2023    Orders: -     Lipid panel; Future -     Hepatic function panel; Future -     Basic metabolic panel with GFR; Future  Hyperglycemia Assessment & Plan: Low carb diet and exercise. Follow met b and A1c.  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 Negative MRI brain and MRA. Recommended continuing aspirin 81mg  daily and continue pravastatin . 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 Saw neurology in follow up 06/27/23 - stable aneurysm. No further imaging warranted.    Cough, unspecified type Assessment & Plan: Increased cough and congestion as outlined. Worsening/persistent symptoms. Coughing fits. Concern regarding URI. Treat with prednisone  taper as directed. Zpak. Robitussin DM. Follow.  Call with update. POC covid test - negative.    Obstructive sleep apnea Assessment & Plan: Continue cpap.    Pulmonary nodule Assessment & Plan: Had f/u with Dr Rowena 03/14/23 - f/u lung nodule. Presenting with a 10 mm LLL solid nodule and similarly a ground glass nodule in  the RLL. The nodule is not spiculated, with no associated lymphadenopathy and is not PET avid on CT. The LLL nodule exhibits 1.4% risk of being malignant based on the [facility] SPN risk score calculator. She is ECOG 0. Both nodules have been stable on multiple repeat chest CT's. Recommended f/u CT in one year.    SVT (supraventricular tachycardia) [facility]) Assessment & Plan: Saw cardiology 06/21/23  - no recurrence. SVT - stable.    Other orders -     predniSONE ; Take 6 tablets x 1 day and then decrease by 1/2 tablet per day until down to zero mg.  Dispense: 39 tablet; Refill: 0 -     Azithromycin ; Take 2 tablets on day 1, then 1 tablet daily on days 2 through 5  Dispense: 6 tablet; Refill: 0     Allena Hamilton, MD

## 2023-08-19 ENCOUNTER — Encounter: Payer: Self-pay | Admitting: Internal Medicine

## 2023-08-19 NOTE — Assessment & Plan Note (Addendum)
 Increased cough and congestion as outlined. Worsening/persistent symptoms. Coughing fits. Concern regarding URI. Treat with prednisone  taper as directed. Zpak. Robitussin DM. Follow.  Call with update. POC covid test - negative.

## 2023-08-19 NOTE — Assessment & Plan Note (Signed)
 Had f/u with Dr Rowena 03/14/23 - f/u lung nodule. Presenting with a 10 mm LLL solid nodule and similarly a ground glass nodule in the RLL. The nodule is not spiculated, with no associated lymphadenopathy and is not PET avid on CT. The LLL nodule exhibits 1.4% risk of being malignant based on the [facility] SPN risk score calculator. She is ECOG 0. Both nodules have been stable on multiple repeat chest CT's. Recommended f/u CT in one year.

## 2023-08-19 NOTE — Assessment & Plan Note (Signed)
 Saw neurology - evaluated of recent blurred vision, etc. Negative MRI brain and MRA. Recommended continuing aspirin 81mg  daily and continue pravastatin . 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 Saw neurology in follow up 06/27/23 - stable aneurysm. No further imaging warranted.

## 2023-08-19 NOTE — Assessment & Plan Note (Signed)
Previous abdominal ultrasound - changes c/w fatty liver.  Follow liver function tests.

## 2023-08-19 NOTE — Assessment & Plan Note (Signed)
 Continue cpap.

## 2023-08-19 NOTE — Assessment & Plan Note (Signed)
 Saw cardiology 06/21/23  - no recurrence. SVT - stable.

## 2023-08-19 NOTE — Assessment & Plan Note (Signed)
 Low carb diet and exercise. Follow met b and A1c.  Lab Results  Component Value Date   HGBA1C 5.6 06/01/2023

## 2023-08-19 NOTE — Assessment & Plan Note (Signed)
 On pravastatin .  Low cholesterol diet and exercise.  Follow lipid panel.  Lab Results  Component Value Date   CHOL 147 06/01/2023   HDL 44.70 06/01/2023   LDLCALC 78 06/01/2023   LDLDIRECT 70.0 02/22/2022   TRIG 122.0 06/01/2023   CHOLHDL 3 06/01/2023

## 2023-09-24 ENCOUNTER — Ambulatory Visit (INDEPENDENT_AMBULATORY_CARE_PROVIDER_SITE_OTHER)

## 2023-09-24 ENCOUNTER — Ambulatory Visit: Admitting: Internal Medicine

## 2023-09-24 ENCOUNTER — Telehealth: Payer: Self-pay

## 2023-09-24 ENCOUNTER — Ambulatory Visit: Payer: Self-pay | Admitting: Internal Medicine

## 2023-09-24 ENCOUNTER — Encounter: Payer: Self-pay | Admitting: Internal Medicine

## 2023-09-24 ENCOUNTER — Other Ambulatory Visit: Payer: Self-pay

## 2023-09-24 VITALS — BP 120/78 | HR 91 | Ht 65.0 in | Wt 127.0 lb

## 2023-09-24 DIAGNOSIS — R053 Chronic cough: Secondary | ICD-10-CM

## 2023-09-24 DIAGNOSIS — R918 Other nonspecific abnormal finding of lung field: Secondary | ICD-10-CM | POA: Diagnosis not present

## 2023-09-24 DIAGNOSIS — I771 Stricture of artery: Secondary | ICD-10-CM | POA: Diagnosis not present

## 2023-09-24 DIAGNOSIS — I700 Atherosclerosis of aorta: Secondary | ICD-10-CM | POA: Diagnosis not present

## 2023-09-24 MED ORDER — ALBUTEROL SULFATE HFA 108 (90 BASE) MCG/ACT IN AERS: 2.0000 | INHALATION_SPRAY | Freq: Four times a day (QID) | RESPIRATORY_TRACT | 0 refills | Status: DC | PRN

## 2023-09-24 MED ORDER — CEFDINIR 300 MG PO CAPS: 300.0000 mg | ORAL_CAPSULE | Freq: Two times a day (BID) | ORAL | 0 refills | Status: DC

## 2023-09-24 MED ORDER — DOXYCYCLINE HYCLATE 100 MG PO TABS: 100.0000 mg | ORAL_TABLET | Freq: Two times a day (BID) | ORAL | 0 refills | Status: DC

## 2023-09-24 MED ORDER — PREDNISONE 10 MG PO TABS
ORAL_TABLET | ORAL | 0 refills | Status: DC
Start: 2023-09-24 — End: 2023-10-25

## 2023-09-24 NOTE — Progress Notes (Unsigned)
 Subjective:    Patient ID: Dawn Wolfe, female    DOB: 1945-09-10, 78 y.o.   MRN: [id number]  Patient here for  Chief Complaint  Patient presents with   Cough    Cough x 5 weeks    HPI Here as a work in appt - work in with persistent cough and congestion. Symptoms started several weeks ago. Deep cough. No sore throat. Coughing fits. Some increased drainage. Has been taking otc claritin, cough medication. No acid reflux. No vomiting. No documented fever. No increased sob.    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 Surgical removal   Cataract removed by Dr.Beavis 2017   Complication of anesthesia    Glaucoma borderline per Dr. Manus Edison   Hemorrhoids    History of colonoscopy 2003   Normal, Dr. Nolon, [place]    Hypercholesterolemia    Normal exercise sestamibi stress test 2008   Ken Fath   Obstructive sleep apnea on CPAP 2007   PONV (postoperative nausea and vomiting)    Rotator cuff tear, left    Sleep apnea use cpap machine  15 yrs   Past Surgical History:  Procedure Laterality Date   ABDOMINALPLASTY W/ LIPOSUCTION  AGE 40   BUNIONECTOMY  X3  LAST ONE 2003   CHOLECYSTECTOMY N/A 04/19/2015   Procedure: LAPAROSCOPIC CHOLECYSTECTOMY WITH INTRAOPERATIVE CHOLANGIOGRAM;  Surgeon: Reyes LELON Cota, MD;  Location: [facility] ORS;  Service: General;  Laterality: N/A;   COLONOSCOPY  2012   Dr Oneil Louder in [place]   COLONOSCOPY WITH PROPOFOL  N/A 09/11/2016   Procedure: COLONOSCOPY WITH PROPOFOL ;  Surgeon: Gaylyn Gladis PENNER, MD;  Location: [facility] ENDOSCOPY;  Service: Endoscopy;  Laterality: N/A;   ERCP N/A 04/29/2015   Procedure: ENDOSCOPIC RETROGRADE CHOLANGIOPANCREATOGRAPHY (ERCP);  Surgeon: Deward CINDERELLA Piedmont, MD;  Location: [facility] ENDOSCOPY;  Service: Gastroenterology;  Laterality: N/A;   EYE SURGERY  cataracts   surgery 2017   cataract extraction   NASAL SEPTUM SURGERY  AGE 74   NASAL SINUS SURGERY  AGE 57   SHOULDER ARTHROSCOPY  10/25/2011   Procedure: ARTHROSCOPY SHOULDER;  Surgeon: Lynwood SHAUNNA Bern, MD;  Location: [facility] [place];  Service: Orthopedics;  Laterality: Left;  with SAD, shave of the labrium    TUBAL LIGATION  1982   Family History  Problem Relation Age of Onset   Acute myelogenous leukemia Sister    Polycythemia Sister 40   Asthma Sister    Varicose Veins Sister    Emphysema Father        smoker   Heart disease Father        myocardial infarction - 27   COPD Father        smoking   Heart attack Father    Breast cancer Paternal Aunt    Testicular cancer Brother    Cancer Brother        testicular   Early death Brother        cancer   Heart disease Mother    Emphysema Mother    COPD Mother        second hand smoke   Asthma Mother    Heart attack Mother    Leukemia Sister    Cancer Sister  leukemia   Colon cancer Neg Hx    Social History   Socioeconomic History   Marital status: Married    Spouse name: Not on file   Number of children: Not on file   Years of education: Not on file   Highest education level: Not on file  Occupational History   Not on file  Tobacco Use   Smoking status: Former    Current packs/day: 0.00    Average packs/day: 2.5 packs/day for 10.0 years (25.0 ttl pk-yrs)    Types: Cigarettes    Start date: 03/29/1967    Quit date: 03/28/1977    Years since quitting: 46.5   Smokeless tobacco: Never  Substance and Sexual Activity   Alcohol use: No    Alcohol/week: 0.0 standard drinks of alcohol   Drug use: No   Sexual activity: Not Currently    Birth control/protection: Post-menopausal  Other Topics Concern   Not on file  Social History Narrative   Not on file   Social Drivers of Health   Financial Resource Strain: Low Risk  (04/23/2023)   Overall Financial Resource Strain (CARDIA)    Difficulty of Paying Living  Expenses: Not hard at all  Food Insecurity: No Food Insecurity (04/23/2023)   Hunger Vital Sign    Worried About Running Out of Food in the Last Year: Never true    Ran Out of Food in the Last Year: Never true  Transportation Needs: No Transportation Needs (04/23/2023)   PRAPARE - Administrator, Civil Service (Medical): No    Lack of Transportation (Non-Medical): No  Physical Activity: Insufficiently Active (04/23/2023)   Exercise Vital Sign    Days of Exercise per Week: 3 days    Minutes of Exercise per Session: 40 min  Stress: No Stress Concern Present (04/23/2023)   [employer] of Occupational Health - Occupational Stress Questionnaire    Feeling of Stress : Not at all  Social Connections: Socially Integrated (04/23/2023)   Social Connection and Isolation Panel    Frequency of Communication with Friends and Family: More than three times a week    Frequency of Social Gatherings with Friends and Family: More than three times a week    Attends Religious Services: More than 4 times per year    Active Member of [employer] or Organizations: Yes    Attends Banker Meetings: Never    Marital Status: Married     Review of Systems  Constitutional:  Negative for appetite change, fever and unexpected weight change.  HENT:  Positive for congestion and postnasal drip. Negative for sore throat.   Respiratory:  Positive for cough. Negative for chest tightness and shortness of breath.   Cardiovascular:  Negative for chest pain and palpitations.  Gastrointestinal:  Negative for abdominal pain, diarrhea, nausea and vomiting.  Genitourinary:  Negative for difficulty urinating and dysuria.  Musculoskeletal:  Negative for joint swelling and myalgias.  Skin:  Negative for color change and rash.  Neurological:  Negative for dizziness and headaches.  Psychiatric/Behavioral:  Negative for agitation and dysphoric mood.        Objective:     BP 120/78   Pulse 91   Ht 5' 5 (1.651  m)   Wt 127 lb (57.6 kg)   SpO2 97%   BMI 21.13 kg/m  Wt Readings from Last 3 Encounters:  09/24/23 127 lb (57.6 kg)  08/15/23 129 lb (58.5 kg)  06/01/23 133 lb 9.6 oz (60.6 kg)  Physical Exam Vitals reviewed.  Constitutional:      General: She is not in acute distress.    Appearance: Normal appearance.  HENT:     Head: Normocephalic and atraumatic.     Right Ear: External ear normal.     Left Ear: External ear normal.  Eyes:     General: No scleral icterus.       Right eye: No discharge.        Left eye: No discharge.     Conjunctiva/sclera: Conjunctivae normal.  Neck:     Thyroid : No thyromegaly.  Cardiovascular:     Rate and Rhythm: Normal rate and regular rhythm.  Pulmonary:     Effort: No respiratory distress.     Breath sounds: Normal breath sounds.     Comments: Increased cough with forced expiration.  Abdominal:     General: Bowel sounds are normal.     Palpations: Abdomen is soft.     Tenderness: There is no abdominal tenderness.  Musculoskeletal:        General: No swelling or tenderness.     Cervical back: Neck supple. No tenderness.  Lymphadenopathy:     Cervical: No cervical adenopathy.  Skin:    Findings: No erythema or rash.  Neurological:     Mental Status: She is alert.  Psychiatric:        Mood and Affect: Mood normal.        Behavior: Behavior normal.         Outpatient Encounter Medications as of 09/24/2023  Medication Sig   acidophilus (RISAQUAD) CAPS capsule Take 1 capsule by mouth daily.   albuterol  (VENTOLIN  HFA) 108 (90 Base) MCG/ACT inhaler Inhale 2 puffs into the lungs every 6 (six) hours as needed for wheezing or shortness of breath.   aspirin EC 81 MG tablet Take 81 mg by mouth daily.   cefdinir  (OMNICEF ) 300 MG capsule Take 1 capsule (300 mg total) by mouth 2 (two) times daily.   Coenzyme Q10 (COQ10 PO) Take by mouth daily.   Cyanocobalamin  (VITAMIN B-12) 1000 MCG SUBL Place 1,000 mcg under the tongue daily.   latanoprost  (XALATAN) 0.005 % ophthalmic solution Place 1 drop into the left eye.   magnesium gluconate (MAGONATE) 500 MG tablet Take 1,000 mg by mouth daily.   Multiple Vitamin (MULTIVITAMIN) tablet Take 1 tablet by mouth daily.   POTASSIUM PO Take by mouth.   pravastatin  (PRAVACHOL ) 20 MG tablet Take 1 tablet (20 mg total) by mouth daily.   predniSONE  (DELTASONE ) 10 MG tablet Take 6 tablets x 1 day and then decrease by 1/2 tablet per day until down to zero mg.   [DISCONTINUED] Cyanocobalamin  (VITAMIN B-12) 1000 MCG/15ML LIQD Take by mouth. 1 injection monthly (Patient taking differently: Take by mouth.)   [DISCONTINUED] predniSONE  (DELTASONE ) 10 MG tablet Take 6 tablets x 1 day and then decrease by 1/2 tablet per day until down to zero mg.   No facility-administered encounter medications on file as of 09/24/2023.     Lab Results  Component Value Date   WBC 5.5 06/01/2023   HGB 12.3 06/01/2023   HCT 36.5 06/01/2023   PLT 151.0 06/01/2023   GLUCOSE 101 (H) 06/01/2023   CHOL 147 06/01/2023   TRIG 122.0 06/01/2023   HDL 44.70 06/01/2023   LDLDIRECT 70.0 02/22/2022   LDLCALC 78 06/01/2023   ALT 13 06/01/2023   AST 23 06/01/2023   NA 139 06/01/2023   K 4.4 06/01/2023   CL 103 06/01/2023  CREATININE 0.91 06/01/2023   BUN 20 06/01/2023   CO2 29 06/01/2023   TSH 1.90 01/31/2023   HGBA1C 5.6 06/01/2023    CT CHEST WO CONTRAST Result Date: 02/03/2023 CLINICAL DATA:  Follow-up pulmonary nodule EXAM: CT CHEST WITHOUT CONTRAST TECHNIQUE: Multidetector CT imaging of the chest was performed following the standard protocol without IV contrast. RADIATION DOSE REDUCTION: This exam was performed according to the departmental dose-optimization program which includes automated exposure control, adjustment of the mA and/or kV according to patient size and/or use of iterative reconstruction technique. COMPARISON:  07/24/2022, 10/13/2021 FINDINGS: Cardiovascular: Heart is normal size. Aorta is normal caliber.  Coronary artery and aortic calcifications. Mediastinum/Nodes: No mediastinal, hilar, or axillary adenopathy. Trachea and esophagus are unremarkable. Thyroid  unremarkable. Lungs/Pleura: Biapical scarring. Bilateral lower lobe pulmonary nodules are again noted. Left lower lobe, 10 x 10 mm image 108/3, stable. Right lower lobe nodule, 11 x 5 mm image 97/3, stable. Right lower lobe, 3 mm image 123/3, stable. No new or enlarging pulmonary nodules. No effusions. Scarring in the right middle lobe and lingula, stable. Upper Abdomen: No acute findings Musculoskeletal: Chest wall soft tissues are unremarkable. No acute bony abnormality. IMPRESSION: Stable bilateral lower lobe pulmonary nodules dating back to 09/23/2021. These could be followed one additional time in 1 year to ensure 2 years stability. Biapical and bibasilar scarring. Coronary artery disease. Aortic Atherosclerosis (ICD10-I70.0). Electronically Signed   By: Franky Crease M.D.   On: 02/03/2023 21:09       Assessment & Plan:  Persistent cough Assessment & Plan: Persistent cough and congestoin as outlined. Coughing fits. Has bene using otc medication. Contiue flushing nose. Nasacort nasal spray as directed. Albuterol  inhaler prn. Prednisone  taper and omnicef  as directed. Probiotics while on abx and for two weeks after completing abx. Check cxr. Follow.  Call with update.   Orders: -     DG Chest 2 View; Future  Other orders -     Cefdinir ; Take 1 capsule (300 mg total) by mouth 2 (two) times daily.  Dispense: 14 capsule; Refill: 0 -     Albuterol  Sulfate HFA; Inhale 2 puffs into the lungs every 6 (six) hours as needed for wheezing or shortness of breath.  Dispense: 18 g; Refill: 0 -     predniSONE ; Take 6 tablets x 1 day and then decrease by 1/2 tablet per day until down to zero mg.  Dispense: 39 tablet; Refill: 0     Allena Hamilton, MD

## 2023-09-24 NOTE — Patient Instructions (Signed)
 Continue flushing your nose.  Nasacort nasal spray - 2 sprays each nostril one time per day. Do this in the evening.   Robitussin DM - as needed for cough and congestion  Take the prednisone  taper as directed.   Omnicef  - (antibiotic) one capsule twice a day.  Albuterol  inhaler as needed   Can take a probiotic (or eat yogurt) daily while on the antibiotic and for two weeks after completing the antibiotic.

## 2023-09-24 NOTE — Telephone Encounter (Signed)
 Randine from [facility] calling to report abnormal cxr reprt. Results are in EPIC. Dr Glendia is aware and will result in result note.

## 2023-09-30 ENCOUNTER — Encounter: Payer: Self-pay | Admitting: Internal Medicine

## 2023-09-30 NOTE — Assessment & Plan Note (Signed)
 Persistent cough and congestoin as outlined. Coughing fits. Has bene using otc medication. Contiue flushing nose. Nasacort nasal spray as directed. Albuterol  inhaler prn. Prednisone  taper and omnicef  as directed. Probiotics while on abx and for two weeks after completing abx. Check cxr. Follow.  Call with update.

## 2023-10-06 ENCOUNTER — Encounter: Payer: Self-pay | Admitting: Internal Medicine

## 2023-10-22 ENCOUNTER — Other Ambulatory Visit: Payer: Self-pay | Admitting: Internal Medicine

## 2023-10-22 DIAGNOSIS — Z1231 Encounter for screening mammogram for malignant neoplasm of breast: Secondary | ICD-10-CM

## 2023-10-24 ENCOUNTER — Ambulatory Visit: Payer: Self-pay

## 2023-10-24 NOTE — Telephone Encounter (Signed)
 Patient reports several weeks of shortness of breath and coughing. Patient states she was dx with PNA about four weeks ago. Patient states symptoms will get better for a short period of time and then return full force. Patient scheduled for an acute appointment tomorrow at 8 AM in PCP office.   FYI Only or Action Required?: FYI only for provider.  Patient was last seen in primary care on 09/24/2023 by Glendia Shad, MD.  Called Nurse Triage reporting Shortness of Breath.  Symptoms began several weeks ago.  Interventions attempted: Rest, hydration, or home remedies.  Symptoms are: unchanged.  Triage Disposition: See PCP When Office is Open (Within 3 Days)  Patient/caregiver understands and will follow disposition?: Yes  Copied from CRM #[id number]. Topic: Clinical - Red Word Triage >> Oct 24, 2023 12:47 PM [place] A wrote: [facility] that prompted transfer to Nurse Triage: Patient is having problem breathing and coughing a lot-had Pnuemonia for 4 weeks Reason for Disposition  [1] MODERATE longstanding difficulty breathing (e.g., speaks in phrases, SOB even at rest, pulse 100-120) AND [2] SAME as normal  Answer Assessment - Initial Assessment Questions 1. RESPIRATORY STATUS: Describe your breathing? (e.g., wheezing, shortness of breath, unable to speak, severe coughing)      Shortness of breath, severe coughing 2. ONSET: When did this breathing problem begin?      Been going on for four weeks 3. PATTERN Does the difficult breathing come and go, or has it been constant since it started?      constant 4. SEVERITY: How bad is your breathing? (e.g., mild, moderate, severe)      mild 5. RECURRENT SYMPTOM: Have you had difficulty breathing before? If Yes, ask: When was the last time? and What happened that time?      yes 6. CARDIAC HISTORY: Do you have any history of heart disease? (e.g., heart attack, angina, bypass surgery, angioplasty)      yes 7. LUNG HISTORY: Do you have  any history of lung disease?  (e.g., pulmonary embolus, asthma, emphysema)     no 8. CAUSE: What do you think is causing the breathing problem?      Was diagnosed with PNA about four weeks ago 9. OTHER SYMPTOMS: Do you have any other symptoms? (e.g., chest pain, cough, dizziness, fever, runny nose)     no 10. O2 SATURATION MONITOR:  Do you use an oxygen saturation monitor (pulse oximeter) at home? If Yes, ask: What is your reading (oxygen level) today? What is your usual oxygen saturation reading? (e.g., 95%)       97% 12. TRAVEL: Have you traveled out of the country in the last month? (e.g., travel history, exposures)       no  Protocols used: Breathing Difficulty-A-AH

## 2023-10-25 ENCOUNTER — Ambulatory Visit (INDEPENDENT_AMBULATORY_CARE_PROVIDER_SITE_OTHER): Admitting: Family

## 2023-10-25 ENCOUNTER — Ambulatory Visit: Payer: Self-pay | Admitting: Family

## 2023-10-25 ENCOUNTER — Other Ambulatory Visit: Payer: Self-pay | Admitting: Family

## 2023-10-25 ENCOUNTER — Encounter: Payer: Self-pay | Admitting: Family

## 2023-10-25 ENCOUNTER — Ambulatory Visit (INDEPENDENT_AMBULATORY_CARE_PROVIDER_SITE_OTHER)

## 2023-10-25 VITALS — BP 118/62 | HR 77 | Temp 97.8°F | Ht 65.0 in | Wt 130.8 lb

## 2023-10-25 DIAGNOSIS — R0609 Other forms of dyspnea: Secondary | ICD-10-CM | POA: Insufficient documentation

## 2023-10-25 DIAGNOSIS — R053 Chronic cough: Secondary | ICD-10-CM

## 2023-10-25 MED ORDER — BUDESONIDE-FORMOTEROL FUMARATE 80-4.5 MCG/ACT IN AERO: 2.0000 | INHALATION_SPRAY | Freq: Two times a day (BID) | RESPIRATORY_TRACT | 3 refills | Status: DC

## 2023-10-25 MED ORDER — BUDESONIDE 0.25 MG/2ML IN SUSP: 0.2500 mg | Freq: Every day | RESPIRATORY_TRACT | 12 refills | Status: AC | PRN

## 2023-10-25 NOTE — Assessment & Plan Note (Signed)
 DOE with stairs, not peristent. Discussed deconditioning over the last several weeks d/t cough.  EKG NSR. Of note: was coughing persistently during EKG and artifact present.  No significant changes when compared to 01/27/22.

## 2023-10-25 NOTE — Assessment & Plan Note (Addendum)
 No acute respiratory distress. She is not labored in speech Waxing and waning with cough. Cough appears triggered by talking,temperature changes ( outside to inside). Pending CXR. Trial of symbicort , mucinex.Counseled on washing mouth out after symbicort use. Close follow up in 2 weeks.

## 2023-10-25 NOTE — Patient Instructions (Signed)
 EKG is stable.  I have sent in symbicort inhaler while we await on chest xray results.   We will be in close touch; please let me know how you are doing

## 2023-10-25 NOTE — Progress Notes (Signed)
 Assessment & Plan:  Persistent cough Assessment & Plan: No acute respiratory distress. She is not labored in speech Waxing and waning with cough. Cough appears triggered by talking,temperature changes ( outside to inside). Pending CXR. Trial of symbicort , mucinex.Counseled on washing mouth out after symbicort use. Close follow up in 2 weeks.   Orders: -     DG Chest 2 View; Future -     Budesonide-Formoterol Fumarate; Inhale 2 puffs into the lungs 2 (two) times daily.  Dispense: 1 each; Refill: 3 -     EKG 12-Lead  DOE (dyspnea on exertion) Assessment & Plan: DOE with stairs, not peristent. Discussed deconditioning over the last several weeks d/t cough.  EKG NSR. Of note: was coughing persistently during EKG and artifact present.  No significant changes when compared to 01/27/22.      Return precautions given.   Risks, benefits, and alternatives of the medications and treatment plan prescribed today were discussed, and patient expressed understanding.   Education regarding symptom management and diagnosis given to patient on AVS either electronically or printed.  Return in about 2 weeks (around 11/08/2023).  Rollene Northern, FNP  Subjective:    Patient ID: Dawn Wolfe, female    DOB: 1945/03/25, 78 y.o.   MRN: [id number]  CC: Dawn Wolfe is a 78 y.o. female who presents today for an acute visit.    HPI: HPI Discussed the use of AI scribe software for clinical note transcription with the patient, who gave verbal consent to proceed.  History of Present Illness   Dawn Wolfe is a 78 year old female with a history of SVT who presents with a persistent cough and shortness of breath.  She has been experiencing a persistent cough for 3 months. Initially, zpak and prednisone  provided temporary relief, but the cough returned. Treatment with doxycycline  and omnicef  improved her symptoms, but the cough recurred. The cough is primarily triggered by going outside  and returning indoors, accompanied by transient shortness of breath. An albuterol  inhaler offers some relief, and a netti pot  helps her expectorate yellow phlegm.  Shortness of breath occurs particularly during exertion, such as climbing stairs, and has been present for about a month. Despite this, she remains active, regularly using a treadmill and gardening, although her activity level has decreased in the past two months due to her symptoms.   Denies sinus or ear pain.  She does not cough at night and sleeps well.  She is concerned about potential allergies contributing to her symptoms.      Denies CP, orthopnea, leg swelling, epigastric burning  CXR 09/24/23  Small region of focal nodular consolidation along the left heart border, likely within the lingula, suggesting a developing bronchopneumonia. As a small neoplasm could have this appearance, repeat imaging in 6-12 weeks is recommended to document resolution once treatment is complete. Treated with doxycycline  and omnicef   H/o OSA, SVT No h/o CKD  Seen PCP 08/15/23 for cough Given prednisone  and zpak Follows with Dr Rowena for pulmonary nodule  H/o smoking  CT chest 02/03/2023 Stable bilateral lower lobe pulmonary nodules dating back to 09/23/2021. These could be followed one additional time in 1 year to ensure 2 years stability.  Follows with Dr Florencio , last seen 06/21/23 H/o SVT. S/p ablation  She is no longer on metoprolol   Allergies: Codeine Current Outpatient Medications on File Prior to Visit  Medication Sig Dispense Refill   acidophilus (RISAQUAD) CAPS capsule Take 1 capsule by mouth  daily.     albuterol  (VENTOLIN  HFA) 108 (90 Base) MCG/ACT inhaler Inhale 2 puffs into the lungs every 6 (six) hours as needed for wheezing or shortness of breath. 18 g 0   aspirin EC 81 MG tablet Take 81 mg by mouth daily.     Coenzyme Q10 (COQ10 PO) Take by mouth daily.     Cyanocobalamin  (VITAMIN B-12) 1000 MCG SUBL Place 1,000  mcg under the tongue daily.     latanoprost (XALATAN) 0.005 % ophthalmic solution Place 1 drop into the left eye.  3   magnesium gluconate (MAGONATE) 500 MG tablet Take 1,000 mg by mouth daily.     Multiple Vitamin (MULTIVITAMIN) tablet Take 1 tablet by mouth daily.     POTASSIUM PO Take by mouth.     pravastatin  (PRAVACHOL ) 20 MG tablet Take 1 tablet (20 mg total) by mouth daily. 90 tablet 3   No current facility-administered medications on file prior to visit.    Review of Systems  Constitutional:  Negative for chills and fever.  HENT:  Positive for congestion. Negative for sinus pressure, sinus pain and sore throat.   Respiratory:  Positive for cough and shortness of breath. Negative for wheezing.   Cardiovascular:  Negative for chest pain and palpitations.  Gastrointestinal:  Negative for nausea and vomiting.      Objective:    BP 118/62   Pulse 77   Temp 97.8 F (36.6 C) (Oral)   Ht 5' 5 (1.651 m)   Wt 130 lb 12.8 oz (59.3 kg)   SpO2 99%   BMI 21.77 kg/m   BP Readings from Last 3 Encounters:  10/25/23 118/62  09/24/23 120/78  08/15/23 114/70   Wt Readings from Last 3 Encounters:  10/25/23 130 lb 12.8 oz (59.3 kg)  09/24/23 127 lb (57.6 kg)  08/15/23 129 lb (58.5 kg)    Physical Exam Vitals reviewed.  Constitutional:      Appearance: She is well-developed.  HENT:     Head: Normocephalic and atraumatic.     Right Ear: Hearing, tympanic membrane, ear canal and external ear normal. No decreased hearing noted. No drainage, swelling or tenderness. No middle ear effusion. No foreign body. Tympanic membrane is not erythematous or bulging.     Left Ear: Hearing, tympanic membrane, ear canal and external ear normal. No decreased hearing noted. No drainage, swelling or tenderness.  No middle ear effusion. No foreign body. Tympanic membrane is not erythematous or bulging.     Nose: Nose normal. No rhinorrhea.     Right Sinus: No maxillary sinus tenderness or frontal sinus  tenderness.     Left Sinus: No maxillary sinus tenderness or frontal sinus tenderness.     Mouth/Throat:     Pharynx: Uvula midline. No oropharyngeal exudate or posterior oropharyngeal erythema.     Tonsils: No tonsillar abscesses.  Eyes:     Conjunctiva/sclera: Conjunctivae normal.  Cardiovascular:     Rate and Rhythm: Regular rhythm.     Pulses: Normal pulses.     Heart sounds: Normal heart sounds.  Pulmonary:     Effort: Pulmonary effort is normal.     Breath sounds: Normal breath sounds. No wheezing, rhonchi or rales.     Comments: Coarse lung sounds with cough. Lymphadenopathy:     Head:     Right side of head: No submental, submandibular, tonsillar, preauricular, posterior auricular or occipital adenopathy.     Left side of head: No submental, submandibular, tonsillar, preauricular, posterior auricular or  occipital adenopathy.     Cervical: No cervical adenopathy.  Skin:    General: Skin is warm and dry.  Neurological:     Mental Status: She is alert.  Psychiatric:        Speech: Speech normal.        Behavior: Behavior normal.        Thought Content: Thought content normal.

## 2023-10-25 NOTE — Telephone Encounter (Signed)
 pharmacist mentioned breo inhaler but she hasn't made her deductable yet so all of the inhalers will be exspensive to Pt.

## 2023-10-25 NOTE — Telephone Encounter (Signed)
 Spoke with pt to let her know that since the inhalers were too expensive we could send in a nebulizer machine and the solution. Pt stated that she doesn't think she needs it. She stated that she started taking the mucinex and using the albuterol  inhaler and she is feeling much better. Pt stated that she is coughing every little and is not having any trouble breathing.

## 2023-11-08 DIAGNOSIS — H40153 Residual stage of open-angle glaucoma, bilateral: Secondary | ICD-10-CM | POA: Diagnosis not present

## 2023-11-09 ENCOUNTER — Ambulatory Visit: Admitting: Internal Medicine

## 2023-11-09 VITALS — BP 112/66 | HR 73 | Temp 98.1°F | Ht 65.0 in | Wt 128.8 lb

## 2023-11-09 DIAGNOSIS — R053 Chronic cough: Secondary | ICD-10-CM | POA: Diagnosis not present

## 2023-11-09 DIAGNOSIS — R739 Hyperglycemia, unspecified: Secondary | ICD-10-CM

## 2023-11-09 DIAGNOSIS — I471 Supraventricular tachycardia, unspecified: Secondary | ICD-10-CM | POA: Diagnosis not present

## 2023-11-09 DIAGNOSIS — E7800 Pure hypercholesterolemia, unspecified: Secondary | ICD-10-CM

## 2023-11-09 DIAGNOSIS — I720 Aneurysm of carotid artery: Secondary | ICD-10-CM

## 2023-11-09 DIAGNOSIS — R911 Solitary pulmonary nodule: Secondary | ICD-10-CM

## 2023-11-09 DIAGNOSIS — G4733 Obstructive sleep apnea (adult) (pediatric): Secondary | ICD-10-CM

## 2023-11-09 LAB — HEPATIC FUNCTION PANEL
ALT: 13 U/L (ref 0–35)
AST: 19 U/L (ref 0–37)
Albumin: 4.3 g/dL (ref 3.5–5.2)
Alkaline Phosphatase: 60 U/L (ref 39–117)
Bilirubin, Direct: 0.2 mg/dL (ref 0.0–0.3)
Total Bilirubin: 0.8 mg/dL (ref 0.2–1.2)
Total Protein: 7.5 g/dL (ref 6.0–8.3)

## 2023-11-09 LAB — BASIC METABOLIC PANEL WITH GFR
BUN: 20 mg/dL (ref 6–23)
CO2: 27 meq/L (ref 19–32)
Calcium: 9.5 mg/dL (ref 8.4–10.5)
Chloride: 103 meq/L (ref 96–112)
Creatinine, Ser: 0.81 mg/dL (ref 0.40–1.20)
GFR: 70.17 mL/min (ref >60.00)
Glucose, Bld: 107 mg/dL — ABNORMAL HIGH (ref 70–99)
Potassium: 4.2 meq/L (ref 3.5–5.1)
Sodium: 139 meq/L (ref 135–145)

## 2023-11-09 LAB — LIPID PANEL
Cholesterol: 152 mg/dL (ref 0–200)
HDL: 41.9 mg/dL (ref >39.00)
LDL Cholesterol: 88 mg/dL (ref 0–99)
NonHDL: 109.84
Total CHOL/HDL Ratio: 4
Triglycerides: 108 mg/dL (ref 0.0–149.0)
VLDL: 21.6 mg/dL (ref 0.0–40.0)

## 2023-11-09 LAB — HEMOGLOBIN A1C: Hgb A1c MFr Bld: 5.8 % (ref 4.6–6.5)

## 2023-11-09 MED ORDER — PRAVASTATIN SODIUM 20 MG PO TABS: 20.0000 mg | ORAL_TABLET | Freq: Every day | ORAL | 3 refills | Status: AC

## 2023-11-09 NOTE — Progress Notes (Signed)
 Subjective:    Patient ID: Dawn Wolfe, female    DOB: 1945-03-16, 78 y.o.   MRN: [id number]  Patient here for  Chief Complaint  Patient presents with   Follow-up    HPI Here for work in appt. - work in - follow up - persistent cough. Initially seen 09/24/23 - cough - treated with omnicef , prednisone  and albuterol  inhaler. CXR - changes appear to be c/w pneumonia. Doxycycline  added. Had appt with [facility] - 10/25/23 - persistent cough. Recommended cxr and symbicort. CXR - no acute appearing airspace opacity. She is better. Still with cough. Denies sob. Denies drainage. Has had acid reflux on a couple of  occasions. No significant acid reflux reported. Does report - throat congestion.    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 Surgical removal   Cataract removed by Dr.Beavis 2017   Complication of anesthesia    Glaucoma borderline per Dr. Manus Edison   Hemorrhoids    History of colonoscopy 2003   Normal, Dr. Nolon, [place]    Hypercholesterolemia    Normal exercise sestamibi stress test 2008   Ken Fath   Obstructive sleep apnea on CPAP 2007   PONV (postoperative nausea and vomiting)    Rotator cuff tear, left    Sleep apnea use cpap machine  15 yrs   Past Surgical History:  Procedure Laterality Date   ABDOMINALPLASTY W/ LIPOSUCTION  AGE 28   BUNIONECTOMY  X3  LAST ONE 2003   CHOLECYSTECTOMY N/A 04/19/2015   Procedure: LAPAROSCOPIC CHOLECYSTECTOMY WITH INTRAOPERATIVE CHOLANGIOGRAM;  Surgeon: Reyes LELON Cota, MD;  Location: [facility] ORS;  Service: General;  Laterality: N/A;   COLONOSCOPY  2012   Dr Oneil Louder in [place]   COLONOSCOPY WITH PROPOFOL  N/A 09/11/2016   Procedure: COLONOSCOPY WITH PROPOFOL ;  Surgeon: Gaylyn Gladis PENNER, MD;  Location: [facility]COPY;  Service: Endoscopy;  Laterality:  N/A;   ERCP N/A 04/29/2015   Procedure: ENDOSCOPIC RETROGRADE CHOLANGIOPANCREATOGRAPHY (ERCP);  Surgeon: Deward CINDERELLA Piedmont, MD;  Location: [facility] ENDOSCOPY;  Service: Gastroenterology;  Laterality: N/A;   EYE SURGERY  cataracts  surgery 2017   cataract extraction   NASAL SEPTUM SURGERY  AGE 68   NASAL SINUS SURGERY  AGE 41   SHOULDER ARTHROSCOPY  10/25/2011   Procedure: ARTHROSCOPY SHOULDER;  Surgeon: Lynwood SHAUNNA Bern, MD;  Location: [facility] [place];  Service: Orthopedics;  Laterality: Left;  with SAD, shave of the labrium    TUBAL LIGATION  1982   Family History  Problem Relation Age of Onset   Acute myelogenous leukemia Sister    Polycythemia Sister 24   Asthma Sister    Varicose Veins Sister    Emphysema Father        smoker   Heart disease Father        myocardial infarction - 81   COPD Father        smoking   Heart attack Father    Breast cancer Paternal Aunt    Testicular cancer Brother    Cancer Brother        testicular   Early death Brother        cancer   Heart disease Mother    Emphysema Mother    COPD Mother  second hand smoke   Asthma Mother    Heart attack Mother    Leukemia Sister    Cancer Sister        leukemia   Colon cancer Neg Hx    Social History   Socioeconomic History   Marital status: Married    Spouse name: Not on file   Number of children: Not on file   Years of education: Not on file   Highest education level: Not on file  Occupational History   Not on file  Tobacco Use   Smoking status: Former    Current packs/day: 0.00    Average packs/day: 2.5 packs/day for 10.0 years (25.0 ttl pk-yrs)    Types: Cigarettes    Start date: 03/29/1967    Quit date: 03/28/1977    Years since quitting: 46.6   Smokeless tobacco: Never  Substance and Sexual Activity   Alcohol use: No    Alcohol/week: 0.0 standard drinks of alcohol   Drug use: No   Sexual activity: Not Currently    Birth control/protection: Post-menopausal  Other Topics  Concern   Not on file  Social History Narrative   Not on file   Social Drivers of Health   Financial Resource Strain: Low Risk  (04/23/2023)   Overall Financial Resource Strain (CARDIA)    Difficulty of Paying Living Expenses: Not hard at all  Food Insecurity: No Food Insecurity (04/23/2023)   Hunger Vital Sign    Worried About Running Out of Food in the Last Year: Never true    Ran Out of Food in the Last Year: Never true  Transportation Needs: No Transportation Needs (04/23/2023)   PRAPARE - Administrator, Civil Service (Medical): No    Lack of Transportation (Non-Medical): No  Physical Activity: Insufficiently Active (04/23/2023)   Exercise Vital Sign    Days of Exercise per Week: 3 days    Minutes of Exercise per Session: 40 min  Stress: No Stress Concern Present (04/23/2023)   [employer] of Occupational Health - Occupational Stress Questionnaire    Feeling of Stress : Not at all  Social Connections: Socially Integrated (04/23/2023)   Social Connection and Isolation Panel    Frequency of Communication with Friends and Family: More than three times a week    Frequency of Social Gatherings with Friends and Family: More than three times a week    Attends Religious Services: More than 4 times per year    Active Member of [employer] or Organizations: Yes    Attends Banker Meetings: Never    Marital Status: Married     Review of Systems  Constitutional:  Negative for appetite change and unexpected weight change.  HENT:  Positive for postnasal drip. Negative for congestion and sinus pressure.   Respiratory:  Positive for cough. Negative for chest tightness and shortness of breath.   Cardiovascular:  Negative for chest pain, palpitations and leg swelling.  Gastrointestinal:  Negative for abdominal pain, nausea and vomiting.       Acid reflux on a couple of occasions.   Genitourinary:  Negative for difficulty urinating and dysuria.  Musculoskeletal:  Negative  for joint swelling and myalgias.  Skin:  Negative for color change and rash.  Neurological:  Negative for dizziness and headaches.  Psychiatric/Behavioral:  Negative for agitation and dysphoric mood.        Objective:     BP 112/66   Pulse 73   Temp 98.1 F (36.7 C)  Ht 5' 5 (1.651 m)   Wt 128 lb 12.8 oz (58.4 kg)   SpO2 97%   BMI 21.43 kg/m  Wt Readings from Last 3 Encounters:  11/09/23 128 lb 12.8 oz (58.4 kg)  10/25/23 130 lb 12.8 oz (59.3 kg)  09/24/23 127 lb (57.6 kg)    Physical Exam Vitals reviewed.  Constitutional:      General: She is not in acute distress.    Appearance: Normal appearance.  HENT:     Head: Normocephalic and atraumatic.     Right Ear: External ear normal.     Left Ear: External ear normal.     Mouth/Throat:     Pharynx: No oropharyngeal exudate or posterior oropharyngeal erythema.  Eyes:     General: No scleral icterus.       Right eye: No discharge.        Left eye: No discharge.     Conjunctiva/sclera: Conjunctivae normal.  Neck:     Thyroid : No thyromegaly.  Cardiovascular:     Rate and Rhythm: Normal rate and regular rhythm.  Pulmonary:     Effort: No respiratory distress.     Breath sounds: Normal breath sounds. No wheezing.  Abdominal:     General: Bowel sounds are normal.     Palpations: Abdomen is soft.     Tenderness: There is no abdominal tenderness.  Musculoskeletal:        General: No swelling or tenderness.     Cervical back: Neck supple. No tenderness.  Lymphadenopathy:     Cervical: No cervical adenopathy.  Skin:    Findings: No erythema or rash.  Neurological:     Mental Status: She is alert.  Psychiatric:        Mood and Affect: Mood normal.        Behavior: Behavior normal.         Outpatient Encounter Medications as of 11/09/2023  Medication Sig   acidophilus (RISAQUAD) CAPS capsule Take 1 capsule by mouth daily.   albuterol  (VENTOLIN  HFA) 108 (90 Base) MCG/ACT inhaler Inhale 2 puffs into the lungs  every 6 (six) hours as needed for wheezing or shortness of breath.   aspirin EC 81 MG tablet Take 81 mg by mouth daily.   Coenzyme Q10 (COQ10 PO) Take by mouth daily.   Cyanocobalamin  (VITAMIN B-12) 1000 MCG SUBL Place 1,000 mcg under the tongue daily.   ferrous sulfate 325 (65 FE) MG EC tablet Take 325 mg by mouth daily with breakfast.   latanoprost (XALATAN) 0.005 % ophthalmic solution Place 1 drop into the left eye.   magnesium gluconate (MAGONATE) 500 MG tablet Take 1,000 mg by mouth daily.   Multiple Vitamin (MULTIVITAMIN) tablet Take 1 tablet by mouth daily.   POTASSIUM PO Take by mouth.   budesonide (PULMICORT) 0.25 MG/2ML nebulizer solution Take 2 mLs (0.25 mg total) by nebulization daily as needed.   budesonide-formoterol (SYMBICORT) 80-4.5 MCG/ACT inhaler Inhale 2 puffs into the lungs 2 (two) times daily.   pravastatin  (PRAVACHOL ) 20 MG tablet Take 1 tablet (20 mg total) by mouth daily.   [DISCONTINUED] pravastatin  (PRAVACHOL ) 20 MG tablet Take 1 tablet (20 mg total) by mouth daily.   No facility-administered encounter medications on file as of 11/09/2023.     Lab Results  Component Value Date   WBC 5.5 06/01/2023   HGB 12.3 06/01/2023   HCT 36.5 06/01/2023   PLT 151.0 06/01/2023   GLUCOSE 107 (H) 11/09/2023   CHOL 152 11/09/2023   TRIG  108.0 11/09/2023   HDL 41.90 11/09/2023   LDLDIRECT 70.0 02/22/2022   LDLCALC 88 11/09/2023   ALT 13 11/09/2023   AST 19 11/09/2023   NA 139 11/09/2023   K 4.2 11/09/2023   CL 103 11/09/2023   CREATININE 0.81 11/09/2023   BUN 20 11/09/2023   CO2 27 11/09/2023   TSH 1.90 01/31/2023   HGBA1C 5.8 11/09/2023    CT CHEST WO CONTRAST Result Date: 02/03/2023 CLINICAL DATA:  Follow-up pulmonary nodule EXAM: CT CHEST WITHOUT CONTRAST TECHNIQUE: Multidetector CT imaging of the chest was performed following the standard protocol without IV contrast. RADIATION DOSE REDUCTION: This exam was performed according to the departmental  dose-optimization program which includes automated exposure control, adjustment of the mA and/or kV according to patient size and/or use of iterative reconstruction technique. COMPARISON:  07/24/2022, 10/13/2021 FINDINGS: Cardiovascular: Heart is normal size. Aorta is normal caliber. Coronary artery and aortic calcifications. Mediastinum/Nodes: No mediastinal, hilar, or axillary adenopathy. Trachea and esophagus are unremarkable. Thyroid  unremarkable. Lungs/Pleura: Biapical scarring. Bilateral lower lobe pulmonary nodules are again noted. Left lower lobe, 10 x 10 mm image 108/3, stable. Right lower lobe nodule, 11 x 5 mm image 97/3, stable. Right lower lobe, 3 mm image 123/3, stable. No new or enlarging pulmonary nodules. No effusions. Scarring in the right middle lobe and lingula, stable. Upper Abdomen: No acute findings Musculoskeletal: Chest wall soft tissues are unremarkable. No acute bony abnormality. IMPRESSION: Stable bilateral lower lobe pulmonary nodules dating back to 09/23/2021. These could be followed one additional time in 1 year to ensure 2 years stability. Biapical and bibasilar scarring. Coronary artery disease. Aortic Atherosclerosis (ICD10-I70.0). Electronically Signed   By: Franky Crease M.D.   On: 02/03/2023 21:09       Assessment & Plan:  Persistent cough Assessment & Plan: Persistent cough. Initially seen 09/24/23 - cough - treated with omnicef , prednisone  and albuterol  inhaler. CXR - changes appear to be c/w pneumonia. Doxycycline  added. Had appt with Rollene [facility] - 10/25/23 - persistent cough. Recommended cxr and symbicort. CXR - no acute appearing airspace opacity. She is better. Still with cough. Denies sob. Denies drainage. Has had acid reflux on a couple of  occasions. Does report throat congestion. Continue mucinex DM - feels helps. Continue saline flushes. Trial of pepcid . Given persistent cough - recommend referral to pulmonary for further evaluation.   Orders: -     Pulmonary  Visit  Hypercholesteremia Assessment & Plan: On pravastatin .  Low cholesterol diet and exercise.  Follow lipid panel.  Lab Results  Component Value Date   CHOL 152 11/09/2023   HDL 41.90 11/09/2023   LDLCALC 88 11/09/2023   LDLDIRECT 70.0 02/22/2022   TRIG 108.0 11/09/2023   CHOLHDL 4 11/09/2023    Orders: -     Basic metabolic panel with GFR -     Hepatic function panel -     Lipid panel  Hyperglycemia Assessment & Plan: Low carb diet and exercise. Follow met b and A1c.  Lab Results  Component Value Date   HGBA1C 5.8 11/09/2023     Orders: -     Hemoglobin A1c  SVT (supraventricular tachycardia) Assessment & Plan: Saw cardiology 06/21/23  - no recurrence. SVT - stable.    Pulmonary nodule Assessment & Plan: Had f/u with Dr Rowena 03/14/23 - f/u lung nodule. Presenting with a 10 mm LLL solid nodule and similarly a ground glass nodule in the RLL. The nodule is not spiculated, with no associated lymphadenopathy and is not  PET avid on CT. The LLL nodule exhibits 1.4% risk of being malignant based on the [facility] SPN risk score calculator. She is ECOG 0. Both nodules have been stable on multiple repeat chest CT's. Recommended f/u CT in one year.    Obstructive sleep apnea Assessment & Plan: Continue cpap.   Carotid artery aneurysm Assessment & Plan: Saw neurology - evaluated of recent blurred vision, etc. Negative MRI brain and MRA. Recommended continuing aspirin 81mg  daily and continue pravastatin . 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 Saw neurology in follow up 06/27/23 - stable aneurysm. No further imaging warranted.    Other orders -     Pravastatin  Sodium; Take 1 tablet (20 mg total) by mouth daily.  Dispense: 90 tablet; Refill: 3     Allena Hamilton, MD

## 2023-11-10 ENCOUNTER — Encounter: Payer: Self-pay | Admitting: Internal Medicine

## 2023-11-10 NOTE — Assessment & Plan Note (Signed)
 Continue cpap.

## 2023-11-10 NOTE — Assessment & Plan Note (Signed)
 Saw cardiology 06/21/23  - no recurrence. SVT - stable.

## 2023-11-10 NOTE — Assessment & Plan Note (Signed)
 Low carb diet and exercise. Follow met b and A1c.  Lab Results  Component Value Date   HGBA1C 5.8 11/09/2023

## 2023-11-10 NOTE — Assessment & Plan Note (Signed)
 Persistent cough. Initially seen 09/24/23 - cough - treated with omnicef , prednisone  and albuterol  inhaler. CXR - changes appear to be c/w pneumonia. Doxycycline  added. Had appt with Rollene Northern - 10/25/23 - persistent cough. Recommended cxr and symbicort. CXR - no acute appearing airspace opacity. She is better. Still with cough. Denies sob. Denies drainage. Has had acid reflux on a couple of  occasions. Does report throat congestion. Continue mucinex DM - feels helps. Continue saline flushes. Trial of pepcid . Given persistent cough - recommend referral to pulmonary for further evaluation.

## 2023-11-10 NOTE — Assessment & Plan Note (Signed)
 Had f/u with Dr Rowena 03/14/23 - f/u lung nodule. Presenting with a 10 mm LLL solid nodule and similarly a ground glass nodule in the RLL. The nodule is not spiculated, with no associated lymphadenopathy and is not PET avid on CT. The LLL nodule exhibits 1.4% risk of being malignant based on the [facility] SPN risk score calculator. She is ECOG 0. Both nodules have been stable on multiple repeat chest CT's. Recommended f/u CT in one year.

## 2023-11-10 NOTE — Assessment & Plan Note (Signed)
 Saw neurology - evaluated of recent blurred vision, etc. Negative MRI brain and MRA. Recommended continuing aspirin 81mg  daily and continue pravastatin . 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 Saw neurology in follow up 06/27/23 - stable aneurysm. No further imaging warranted.

## 2023-11-10 NOTE — Assessment & Plan Note (Signed)
 On pravastatin .  Low cholesterol diet and exercise.  Follow lipid panel.  Lab Results  Component Value Date   CHOL 152 11/09/2023   HDL 41.90 11/09/2023   LDLCALC 88 11/09/2023   LDLDIRECT 70.0 02/22/2022   TRIG 108.0 11/09/2023   CHOLHDL 4 11/09/2023

## 2023-11-12 ENCOUNTER — Ambulatory Visit: Payer: Self-pay | Admitting: Internal Medicine

## 2023-11-13 ENCOUNTER — Other Ambulatory Visit

## 2023-11-14 ENCOUNTER — Encounter: Payer: Self-pay | Admitting: Student in an Organized Health Care Education/Training Program

## 2023-11-14 ENCOUNTER — Ambulatory Visit (INDEPENDENT_AMBULATORY_CARE_PROVIDER_SITE_OTHER): Admitting: Student in an Organized Health Care Education/Training Program

## 2023-11-14 VITALS — BP 122/82 | HR 92 | Temp 98.1°F | Ht 65.0 in | Wt 131.0 lb

## 2023-11-14 DIAGNOSIS — R053 Chronic cough: Secondary | ICD-10-CM

## 2023-11-14 DIAGNOSIS — R911 Solitary pulmonary nodule: Secondary | ICD-10-CM

## 2023-11-14 LAB — NITRIC OXIDE: Nitric Oxide: 22

## 2023-11-14 MED ORDER — FLUTICASONE-SALMETEROL 100-50 MCG/ACT IN AEPB: 1.0000 | INHALATION_SPRAY | Freq: Two times a day (BID) | RESPIRATORY_TRACT | 0 refills | Status: DC

## 2023-11-14 NOTE — Progress Notes (Signed)
 Assessment & Plan:   #Subacute cough (Primary) #post-infectious cough    She has a subacute post-infectious cough following pneumonia diagnosed in September. The cough persists, mainly at night and after talking. Previous treatments included antibiotics, prednisone , and inhalers. Current treatment with famotidine  for possible acid reflux has shown some improvement. There is no evidence of asthma on physical exam, though reactive airways remain on the differential. I suspect the cough is subacute and post infectious, with reflux possibly a contributor.  Will trial ICS/LABA, and given Symbicort was not covered by insurance, will trial Wixela for a few weeks and assess for response. If symptoms persist into the future, will consider getting PFT's to assess for any reversible obstruction and further work her up for a cough variant asthma.  - FENO indeterminate at 22 ppb today - fluticasone -salmeterol (WIXELA INHUB) 100-50 MCG/ACT AEPB; Inhale 1 puff into the lungs 2 (two) times daily.  Dispense: 60 each; Refill: 0  #Pulmonary nodule    She has a pulmonary nodule (10 mm LLL solid nodule, and a GGO in the RLL). Neither are spiculated, and have no FDG avidity on PET. Both nodules have been stable on repeat chest imaging. I had discussed these findings with the patient during our prior visit, and she opted for continued radiographic monitoring.  Plan for follow-up scan for January 2026. A recent chest x-ray post-pneumonia showed improvement. No current need for additional imaging as the recent infection may obscure results.  -Schedule a CT scan for January 2026.  -Schedule a follow-up appointment after the CT scan to evaluate the pulmonary nodule and cough.        Return in about 14 weeks (around 02/20/2024).  Belva November, MD [facility]  I spent 30 minutes caring for this patient today, including preparing to see the patient, obtaining a medical history , reviewing a  separately obtained history, performing a medically appropriate examination and/or evaluation, counseling and educating the patient/family/caregiver, ordering medications, tests, or procedures, documenting clinical information in the electronic health record, and independently interpreting results (not separately reported/billed) and communicating results to the patient/family/caregiver  End of visit medications:  Meds ordered this encounter  Medications   fluticasone -salmeterol (WIXELA INHUB) 100-50 MCG/ACT AEPB    Sig: Inhale 1 puff into the lungs 2 (two) times daily.    Dispense:  60 each    Refill:  0     Current Outpatient Medications:    acidophilus (RISAQUAD) CAPS capsule, Take 1 capsule by mouth daily., Disp: , Rfl:    albuterol  (VENTOLIN  HFA) 108 (90 Base) MCG/ACT inhaler, Inhale 2 puffs into the lungs every 6 (six) hours as needed for wheezing or shortness of breath., Disp: 18 g, Rfl: 0   aspirin EC 81 MG tablet, Take 81 mg by mouth daily., Disp: , Rfl:    budesonide (PULMICORT) 0.25 MG/2ML nebulizer solution, Take 2 mLs (0.25 mg total) by nebulization daily as needed., Disp: 60 mL, Rfl: 12   Coenzyme Q10 (COQ10 PO), Take by mouth daily., Disp: , Rfl:    Cyanocobalamin  (VITAMIN B-12) 1000 MCG SUBL, Place 1,000 mcg under the tongue daily., Disp: , Rfl:    ferrous sulfate 325 (65 FE) MG EC tablet, Take 325 mg by mouth daily with breakfast., Disp: , Rfl:    fluticasone -salmeterol (WIXELA INHUB) 100-50 MCG/ACT AEPB, Inhale 1 puff into the lungs 2 (two) times daily., Disp: 60 each, Rfl: 0   latanoprost (XALATAN) 0.005 % ophthalmic solution, Place 1 drop into the left  eye., Disp: , Rfl: 3   magnesium gluconate (MAGONATE) 500 MG tablet, Take 1,000 mg by mouth daily., Disp: , Rfl:    Multiple Vitamin (MULTIVITAMIN) tablet, Take 1 tablet by mouth daily., Disp: , Rfl:    POTASSIUM PO, Take by mouth., Disp: , Rfl:    pravastatin  (PRAVACHOL ) 20 MG tablet, Take 1 tablet (20 mg total) by mouth  daily., Disp: 90 tablet, Rfl: 3   Subjective:   PATIENT ID: Dawn Wolfe GENDER: female DOB: 20-Sep-1945, MRN: [id number]  Chief Complaint  Patient presents with   Medical Management of Chronic Issues    Pneumonia in Sept. Cough with yellow sputum. Did start taking Famotidine  and it has helped. No SOB.  Albuterol - PRN.    HPI  Discussed the use of AI scribe software for clinical note transcription with the patient, who gave verbal consent to proceed.  Dawn Wolfe is a 78 year old female who presents with a persistent cough. She has also been followed in our clinic for a pulmonary nodule.  She has been experiencing a persistent cough since being diagnosed with pneumonia in September. Initially, antibiotics and prednisone  provided temporary relief, but the cough persisted, especially at night and during activities like talking or watching TV. She was later prescribed another course of antibiotics, an inhaler, and Mucinex by her PCP's office, which led to some improvement. Recently, the cough has become less frequent, and she has not used her albuterol  inhaler in about a week, although she continues to carry it. She was prescribed Symbicort but did not take it due to cost.  She reported experiencing reflux symptoms after eating banana sandwiches, which resolved with Tums. She has been taking famotidine  20 mg at night since last Friday, which she believes has helped reduce the cough.  Her family history includes her mother having bronchial asthma and COPD, and her sister having asthma and COPD, despite never smoking. She herself has never had asthma or any breathing issues prior to the pneumonia.  She has a history of a pulmonary nodule that has been stable on radiographic follow-up, with a scan scheduled for January 2026. She get a coronary calcium screen 09/06/2021 which was noted to have a solid pulmonary nodule in the left lower lobe and a groundglass nodule in the right lower  lobe. We had ordered a dedicated CT of the chest and a PET/CT and patient decided to proceed with radiographic monitoring of the nodules rather than go for biopsy. She does have a history of sleep apnea and sees Madelin Stank, NP for it.  She reports compliance with her CPAP and that it has made her sleep and energy levels a lot better.  She used to work an paramedic job and denies any inhalational exposures.  She is a non-smoker and does not drink.    Ancillary information including prior medications, full medical/surgical/family/social histories, and PFTs (when available) are listed below and have been reviewed.    Review of Systems  Constitutional:  Negative for chills, diaphoresis, fever, malaise/fatigue and weight loss.  Respiratory:  Positive for cough. Negative for hemoptysis, sputum production, shortness of breath and wheezing.   Cardiovascular:  Negative for chest pain.     Objective:   Vitals:   11/14/23 0932  BP: 122/82  Pulse: 92  Temp: 98.1 F (36.7 C)  SpO2: 96%  Weight: 131 lb (59.4 kg)  Height: 5' 5 (1.651 m)   96% on RA BMI Readings from Last 3 Encounters:  11/14/23 21.80 kg/m  11/09/23 21.43 kg/m  10/25/23 21.77 kg/m   Wt Readings from Last 3 Encounters:  11/14/23 131 lb (59.4 kg)  11/09/23 128 lb 12.8 oz (58.4 kg)  10/25/23 130 lb 12.8 oz (59.3 kg)    Physical Exam Constitutional:      Appearance: Normal appearance.  Cardiovascular:     Rate and Rhythm: Normal rate and regular rhythm.     Pulses: Normal pulses.     Heart sounds: Normal heart sounds.  Pulmonary:     Effort: Pulmonary effort is normal.     Breath sounds: Normal breath sounds. No wheezing.  Neurological:     General: No focal deficit present.     Mental Status: She is alert and oriented to person, place, and time. Mental status is at baseline.       Ancillary Information    Past Medical History:  Diagnosis Date   Allergy codein   Anemia    h/o as a child   Arthritis  shoulders  (not severe)   left shoulder   Cancer (HCC) 15 yrs ago skin cancer on face   Basil Cell on the face. Surgical removal   Cataract removed by Dr.Beavis 2017   Complication of anesthesia    Glaucoma borderline per Dr. Manus Edison   Hemorrhoids    History of colonoscopy 2003   Normal, Dr. Nolon, [place]    Hypercholesterolemia    Normal exercise sestamibi stress test 2008   Ken Fath   Obstructive sleep apnea on CPAP 2007   PONV (postoperative nausea and vomiting)    Rotator cuff tear, left    Sleep apnea use cpap machine  15 yrs     Family History  Problem Relation Age of Onset   Acute myelogenous leukemia Sister    Polycythemia Sister 81   Asthma Sister    Varicose Veins Sister    Emphysema Father        smoker   Heart disease Father        myocardial infarction - 29   COPD Father        smoking   Heart attack Father    Breast cancer Paternal Aunt    Testicular cancer Brother    Cancer Brother        testicular   Early death Brother        cancer   Heart disease Mother    Emphysema Mother    COPD Mother        second hand smoke   Asthma Mother    Heart attack Mother    Leukemia Sister    Cancer Sister        leukemia   Colon cancer Neg Hx      Past Surgical History:  Procedure Laterality Date   ABDOMINALPLASTY W/ LIPOSUCTION  AGE 77   BUNIONECTOMY  X3  LAST ONE 2003   CHOLECYSTECTOMY N/A 04/19/2015   Procedure: LAPAROSCOPIC CHOLECYSTECTOMY WITH INTRAOPERATIVE CHOLANGIOGRAM;  Surgeon: Reyes LELON Cota, MD;  Location: [facility] ORS;  Service: General;  Laterality: N/A;   COLONOSCOPY  2012   Dr Oneil Louder in [place]   COLONOSCOPY WITH PROPOFOL  N/A 09/11/2016   Procedure: COLONOSCOPY WITH PROPOFOL ;  Surgeon: Gaylyn Gladis PENNER, MD;  Location: [facility] ENDOSCOPY;  Service: Endoscopy;  Laterality: N/A;   ERCP N/A 04/29/2015   Procedure: ENDOSCOPIC RETROGRADE CHOLANGIOPANCREATOGRAPHY (ERCP);  Surgeon: Deward CINDERELLA Piedmont, MD;  Location: [facility] ENDOSCOPY;  Service:  Gastroenterology;  Laterality: N/A;   EYE SURGERY  cataracts  surgery 2017  cataract extraction   NASAL SEPTUM SURGERY  AGE 48   NASAL SINUS SURGERY  AGE 37   SHOULDER ARTHROSCOPY  10/25/2011   Procedure: ARTHROSCOPY SHOULDER;  Surgeon: Lynwood SHAUNNA Bern, MD;  Location: [facility];  Service: Orthopedics;  Laterality: Left;  with SAD, shave of the labrium    TUBAL LIGATION  1982    Social History   Socioeconomic History   Marital status: Married    Spouse name: Not on file   Number of children: Not on file   Years of education: Not on file   Highest education level: Not on file  Occupational History   Not on file  Tobacco Use   Smoking status: Former    Current packs/day: 0.00    Average packs/day: 2.5 packs/day for 10.0 years (25.0 ttl pk-yrs)    Types: Cigarettes    Start date: 03/29/1967    Quit date: 03/28/1977    Years since quitting: 46.6   Smokeless tobacco: Never  Substance and Sexual Activity   Alcohol use: No    Alcohol/week: 0.0 standard drinks of alcohol   Drug use: No   Sexual activity: Not Currently    Birth control/protection: Post-menopausal  Other Topics Concern   Not on file  Social History Narrative   Not on file   Social Drivers of Health   Financial Resource Strain: Low Risk  (04/23/2023)   Overall Financial Resource Strain (CARDIA)    Difficulty of Paying Living Expenses: Not hard at all  Food Insecurity: No Food Insecurity (04/23/2023)   Hunger Vital Sign    Worried About Running Out of Food in the Last Year: Never true    Ran Out of Food in the Last Year: Never true  Transportation Needs: No Transportation Needs (04/23/2023)   PRAPARE - Administrator, Civil Service (Medical): No    Lack of Transportation (Non-Medical): No  Physical Activity: Insufficiently Active (04/23/2023)   Exercise Vital Sign    Days of Exercise per Week: 3 days    Minutes of Exercise per Session: 40 min  Stress: No Stress Concern Present  (04/23/2023)   [employer] of Occupational Health - Occupational Stress Questionnaire    Feeling of Stress : Not at all  Social Connections: Socially Integrated (04/23/2023)   Social Connection and Isolation Panel    Frequency of Communication with Friends and Family: More than three times a week    Frequency of Social Gatherings with Friends and Family: More than three times a week    Attends Religious Services: More than 4 times per year    Active Member of [employer] or Organizations: Yes    Attends Banker Meetings: Never    Marital Status: Married  Catering Manager Violence: Not At Risk (04/23/2023)   Humiliation, Afraid, Rape, and Kick questionnaire    Fear of Current or Ex-Partner: No    Emotionally Abused: No    Physically Abused: No    Sexually Abused: No     Allergies  Allergen Reactions   Codeine Nausea And Vomiting     CBC    Component Value Date/Time   WBC 5.5 06/01/2023 1035   RBC 4.14 06/01/2023 1035   HGB 12.3 06/01/2023 1035   HCT 36.5 06/01/2023 1035   PLT 151.0 06/01/2023 1035   MCV 88.3 06/01/2023 1035   MCH 29.3 01/27/2022 1022   MCHC 33.5 06/01/2023 1035   RDW 13.5 06/01/2023 1035   LYMPHSABS 1.5 06/01/2023 1035  MONOABS 0.4 06/01/2023 1035   EOSABS 0.2 06/01/2023 1035   BASOSABS 0.0 06/01/2023 1035    Pulmonary Functions Testing Results:     No data to display          Outpatient Medications Prior to Visit  Medication Sig Dispense Refill   acidophilus (RISAQUAD) CAPS capsule Take 1 capsule by mouth daily.     albuterol  (VENTOLIN  HFA) 108 (90 Base) MCG/ACT inhaler Inhale 2 puffs into the lungs every 6 (six) hours as needed for wheezing or shortness of breath. 18 g 0   aspirin EC 81 MG tablet Take 81 mg by mouth daily.     budesonide (PULMICORT) 0.25 MG/2ML nebulizer solution Take 2 mLs (0.25 mg total) by nebulization daily as needed. 60 mL 12   Coenzyme Q10 (COQ10 PO) Take by mouth daily.     Cyanocobalamin  (VITAMIN B-12) 1000  MCG SUBL Place 1,000 mcg under the tongue daily.     ferrous sulfate 325 (65 FE) MG EC tablet Take 325 mg by mouth daily with breakfast.     latanoprost (XALATAN) 0.005 % ophthalmic solution Place 1 drop into the left eye.  3   magnesium gluconate (MAGONATE) 500 MG tablet Take 1,000 mg by mouth daily.     Multiple Vitamin (MULTIVITAMIN) tablet Take 1 tablet by mouth daily.     POTASSIUM PO Take by mouth.     pravastatin  (PRAVACHOL ) 20 MG tablet Take 1 tablet (20 mg total) by mouth daily. 90 tablet 3   budesonide-formoterol (SYMBICORT) 80-4.5 MCG/ACT inhaler Inhale 2 puffs into the lungs 2 (two) times daily. (Patient not taking: Reported on 11/14/2023) 1 each 3   No facility-administered medications prior to visit.

## 2023-11-14 NOTE — Patient Instructions (Signed)
  VISIT SUMMARY: During today's visit, we discussed your persistent cough that has been ongoing since your pneumonia diagnosis in September. We reviewed your treatment history and current symptoms, and we also discussed your pulmonary nodule and its follow-up plan.  YOUR PLAN: -SUBACUTE POST-INFECTIOUS COUGH: A subacute post-infectious cough is a cough that persists for weeks following an infection like pneumonia. Your cough has been ongoing since September, mainly at night and during talking. We will discontinue Mucinex DM and prescribe a generic inhaler similar to Symbicort, to be used one puff twice a day for one month. Please remember to wash your mouth after each use. Monitor your symptoms, and if the cough improves in a few weeks, you can stop using the inhaler.  -PULMONARY NODULE: A pulmonary nodule is a small growth in the lung that is usually benign but needs monitoring. Your nodule has been stable, and a follow-up CT scan is scheduled for January 2026. We will evaluate the nodule and your cough during a follow-up appointment after the scan.  INSTRUCTIONS: Please use the prescribed inhaler as directed and monitor your symptoms. If your cough improves, you can discontinue the inhaler in a few weeks. Your next CT scan for the pulmonary nodule is scheduled for January 2026, and we will have a follow-up appointment after the scan to evaluate your condition.

## 2023-11-15 ENCOUNTER — Ambulatory Visit
Admission: RE | Admit: 2023-11-15 | Discharge: 2023-11-15 | Disposition: A | Discharge status: Home / Self Care | Source: Ambulatory Visit | Attending: Internal Medicine | Admitting: Internal Medicine

## 2023-11-15 ENCOUNTER — Ambulatory Visit: Admitting: Internal Medicine

## 2023-11-15 DIAGNOSIS — Z1231 Encounter for screening mammogram for malignant neoplasm of breast: Secondary | ICD-10-CM | POA: Insufficient documentation

## 2023-11-29 DIAGNOSIS — K080 Exfoliation of teeth due to systemic causes: Secondary | ICD-10-CM | POA: Diagnosis not present

## 2023-12-10 ENCOUNTER — Other Ambulatory Visit: Payer: Self-pay | Admitting: Student in an Organized Health Care Education/Training Program

## 2023-12-10 DIAGNOSIS — R053 Chronic cough: Secondary | ICD-10-CM

## 2024-01-02 ENCOUNTER — Ambulatory Visit: Payer: Self-pay | Admitting: Student in an Organized Health Care Education/Training Program

## 2024-01-02 ENCOUNTER — Ambulatory Visit (INDEPENDENT_AMBULATORY_CARE_PROVIDER_SITE_OTHER): Admitting: Pulmonary Disease

## 2024-01-02 VITALS — BP 128/64 | HR 96 | Temp 98.4°F | Ht 65.0 in | Wt 129.0 lb

## 2024-01-02 DIAGNOSIS — R0602 Shortness of breath: Secondary | ICD-10-CM

## 2024-01-02 DIAGNOSIS — R911 Solitary pulmonary nodule: Secondary | ICD-10-CM | POA: Diagnosis not present

## 2024-01-02 DIAGNOSIS — R053 Chronic cough: Secondary | ICD-10-CM

## 2024-01-02 MED ORDER — TRELEGY ELLIPTA 200-62.5-25 MCG/ACT IN AEPB: 1.0000 | INHALATION_SPRAY | Freq: Every day | RESPIRATORY_TRACT | 6 refills | Status: DC

## 2024-01-02 MED ORDER — ALBUTEROL SULFATE HFA 108 (90 BASE) MCG/ACT IN AERS: 2.0000 | INHALATION_SPRAY | Freq: Four times a day (QID) | RESPIRATORY_TRACT | 2 refills | Status: AC | PRN

## 2024-01-02 MED ORDER — PREDNISONE 20 MG PO TABS: 20.0000 mg | ORAL_TABLET | Freq: Every day | ORAL | 0 refills | Status: DC

## 2024-01-02 NOTE — Telephone Encounter (Signed)
 Noted. Nothing further needed.

## 2024-01-02 NOTE — Telephone Encounter (Signed)
 FYI Only or Action Required?: FYI only for provider: appointment scheduled on 12.17.25.  Patient is followed in Pulmonology for Chronic Cough, last seen on 11/14/2023 by Isadora Hose, MD.  Called Nurse Triage reporting Cough.  Symptoms began several months ago.  Interventions attempted: OTC medications: Mucus DM.  Symptoms are: unchanged.  Triage Disposition: See Physician Within 24 Hours  Patient/caregiver understands and will follow disposition?: Yes  Message from [facility] sent at 01/02/2024 12:46 PM EST  Reason for Triage: cough and mucus (green and yellow), pt stated no pain with this cough.    Reason for Disposition  SEVERE coughing spells (e.g., whooping sound after coughing, vomiting after coughing)  Answer Assessment - Initial Assessment Questions 1. ONSET: When did the cough begin?       3 months  2. SEVERITY: How bad is the cough today?       Better since onset  3. SPUTUM: Describe the color of your sputum (e.g., none, dry cough; clear, white, yellow, green)      Green/yellow  4. HEMOPTYSIS: Are you coughing up any blood? If Yes, ask: How much? (e.g., flecks, streaks, tablespoons, etc.)      denies  5. DIFFICULTY BREATHING: Are you having difficulty breathing? If Yes, ask: How bad is it? (e.g., mild, moderate, severe)       denies  6. FEVER: Do you have a fever? If Yes, ask: What is your temperature, how was it measured, and when did it start?      denies  7. CARDIAC HISTORY: Do you have any history of heart disease? (e.g., heart attack, congestive heart failure)       Per pt's chart, CAD and SVT  8. LUNG HISTORY: Do you have any history of lung disease?  (e.g., pulmonary embolus, asthma, emphysema)     Per pt's chart, pulmonary nodule and sleep apnea  9. PE RISK FACTORS: Do you have a history of blood clots? (or: recent major surgery, recent prolonged travel, bedridden)     Per pt's chart, pt does not have pe risk  factors  10. OTHER SYMPTOMS: Pt denies runny nose, wheezing, chest pain            Pt reports she has cough and mucus onset 3 months Pt is taking OTC Mucus DM Pt states she wanted Albuterol , however, she does not have a rx for it and would like one. Pt agrees with plan of care, will call back for any worsening symptoms  Protocols used: Cough - Acute Productive-A-AH

## 2024-01-02 NOTE — Progress Notes (Signed)
 Synopsis: Referred in by Glendia Shad, MD   Subjective:   PATIENT ID: Dawn Wolfe GENDER: female DOB: 04-Sep-1945, MRN: [id number]  Chief Complaint  Patient presents with   Cough    Cough with yellow sputum. DOE. No wheezing. Symptoms going on since October.  Advair- did not help. Albuterol - PRN    HPI Dawn Wolfe is a 78 year old female who presents with a persistent cough. She has also been followed in our clinic for a pulmonary nodule.   She has been experiencing a persistent cough since being diagnosed with pneumonia in September. Initially, antibiotics and prednisone  provided temporary relief, but the cough persisted, especially at night and during activities like talking or watching TV. She was later prescribed another course of antibiotics, an inhaler, and Mucinex by her PCP's office, which led to some improvement. Recently, the cough has become less frequent, and she has not used her albuterol  inhaler in about a week, although she continues to carry it. She was prescribed Symbicort  but did not take it due to cost.   She reported experiencing reflux symptoms after eating banana sandwiches, which resolved with Tums. She has been taking famotidine  20 mg at night since last Friday, which she believes has helped reduce the cough.   Her family history includes her mother having bronchial asthma and COPD, and her sister having asthma and COPD, despite never smoking. She herself has never had asthma or any breathing issues prior to the pneumonia.   She has a history of a pulmonary nodule that has been stable on radiographic follow-up, with a scan scheduled for January 2026. She get a coronary calcium screen 09/06/2021 which was noted to have a solid pulmonary nodule in the left lower lobe and a groundglass nodule in the right lower lobe. We had ordered a dedicated CT of the chest and a PET/CT and patient decided to proceed with radiographic monitoring of the nodules rather  than go for biopsy. She does have a history of sleep apnea and sees Madelin Stank, NP for it.  She reports compliance with her CPAP and that it has made her sleep and energy levels a lot better.   She used to work an paramedic job and denies any inhalational exposures.  She is a non-smoker and does not drink.  OV 01/02/2024 - Dawn Wolfe is here to follow up on her ongoing cough. She was prescribed advair a month ago by Dr. Isadora for a subacute/chronic cough but her cough persisted. She does have wheezing on exam and reported a strong family history of asthma. She also reported a short period of tobacco use in her 30s. I will step up therapy to Trelegy 200 1 puff daily. Prescribe a short course of prednisone  and schedule her for a PFT. She is already scheduled for a CT chest in Jan 2026. I will have her follow up with Dr. Isadora in 2026.     Family History  Problem Relation Age of Onset   Acute myelogenous leukemia Sister    Polycythemia Sister 49   Asthma Sister    Varicose Veins Sister    Emphysema Father        smoker   Heart disease Father        myocardial infarction - 59   COPD Father        smoking   Heart attack Father    Breast cancer Paternal Aunt    Testicular cancer Brother    Cancer Brother  testicular   Early death Brother        cancer   Heart disease Mother    Emphysema Mother    COPD Mother        second hand smoke   Asthma Mother    Heart attack Mother    Leukemia Sister    Cancer Sister        leukemia   Colon cancer Neg Hx      Social History   Socioeconomic History   Marital status: Married    Spouse name: Not on file   Number of children: Not on file   Years of education: Not on file   Highest education level: Not on file  Occupational History   Not on file  Tobacco Use   Smoking status: Former    Current packs/day: 0.00    Average packs/day: 2.5 packs/day for 10.0 years (25.0 ttl pk-yrs)    Types: Cigarettes    Start date: 03/29/1967     Quit date: 03/28/1977    Years since quitting: 46.7   Smokeless tobacco: Never  Substance and Sexual Activity   Alcohol use: No    Alcohol/week: 0.0 standard drinks of alcohol   Drug use: No   Sexual activity: Not Currently    Birth control/protection: Post-menopausal  Other Topics Concern   Not on file  Social History Narrative   Not on file   Social Drivers of Health   Tobacco Use: Medium Risk (11/14/2023)   Patient History    Smoking Tobacco Use: Former    Smokeless Tobacco Use: Never    Passive Exposure: Not on Actuary Strain: Low Risk (04/23/2023)   Overall Financial Resource Strain (CARDIA)    Difficulty of Paying Living Expenses: Not hard at all  Food Insecurity: No Food Insecurity (04/23/2023)   Hunger Vital Sign    Worried About Running Out of Food in the Last Year: Never true    Ran Out of Food in the Last Year: Never true  Transportation Needs: No Transportation Needs (04/23/2023)   PRAPARE - Administrator, Civil Service (Medical): No    Lack of Transportation (Non-Medical): No  Physical Activity: Insufficiently Active (04/23/2023)   Exercise Vital Sign    Days of Exercise per Week: 3 days    Minutes of Exercise per Session: 40 min  Stress: No Stress Concern Present (04/23/2023)   [employer] of Occupational Health - Occupational Stress Questionnaire    Feeling of Stress : Not at all  Social Connections: Socially Integrated (04/23/2023)   Social Connection and Isolation Panel    Frequency of Communication with Friends and Family: More than three times a week    Frequency of Social Gatherings with Friends and Family: More than three times a week    Attends Religious Services: More than 4 times per year    Active Member of [employer] or Organizations: Yes    Attends Banker Meetings: Never    Marital Status: Married  Catering Manager Violence: Not At Risk (04/23/2023)   Humiliation, Afraid, Rape, and Kick questionnaire    Fear of  Current or Ex-Partner: No    Emotionally Abused: No    Physically Abused: No    Sexually Abused: No  Depression (PHQ2-9): Low Risk (11/09/2023)   Depression (PHQ2-9)    PHQ-2 Score: 0  Alcohol Screen: Low Risk (04/23/2023)   Alcohol Screen    Last Alcohol Screening Score (AUDIT): 0  Housing: Unknown (06/27/2023)  Received from [facility]   Epic    Unable to Pay for Housing in the Last Year: Not on file    Number of Times Moved in the Last Year: Not on file    At any time in the past 12 months, were you homeless or living in a shelter (including now)?: No  Utilities: Not At Risk (04/23/2023)   [employer] Utilities    Threatened with loss of utilities: No  Health Literacy: Adequate Health Literacy (04/23/2023)   B1300 Health Literacy    Frequency of need for help with medical instructions: Never        Objective:   Vitals:   01/02/24 1549  BP: 128/64  Pulse: 96  Temp: 98.4 F (36.9 C)  SpO2: 96%  Weight: 129 lb (58.5 kg)  Height: 5' 5 (1.651 m)   96% on RA BMI Readings from Last 3 Encounters:  01/02/24 21.47 kg/m  11/14/23 21.80 kg/m  11/09/23 21.43 kg/m   Wt Readings from Last 3 Encounters:  01/02/24 129 lb (58.5 kg)  11/14/23 131 lb (59.4 kg)  11/09/23 128 lb 12.8 oz (58.4 kg)    Physical Exam GEN: NAD HEENT: Supple Neck, Reactive Pupils, EOMI  CVS: Normal S1, Normal S2, RRR, No murmurs or ES appreciated  Lungs: Faint expiratory wheezing appreciate bilaterally.   Abdomen: Soft, non tender, non distended, + BS  Extremities: Warm and well perfused, No edema   Labs and imaging were reviewed.  Ancillary Information   CBC    Component Value Date/Time   WBC 5.5 06/01/2023 1035   RBC 4.14 06/01/2023 1035   HGB 12.3 06/01/2023 1035   HCT 36.5 06/01/2023 1035   PLT 151.0 06/01/2023 1035   MCV 88.3 06/01/2023 1035   MCH 29.3 01/27/2022 1022   MCHC 33.5 06/01/2023 1035   RDW 13.5 06/01/2023 1035   LYMPHSABS 1.5 06/01/2023 1035   MONOABS 0.4  06/01/2023 1035   EOSABS 0.2 06/01/2023 1035   BASOSABS 0.0 06/01/2023 1035        No data to display           Assessment & Plan:  #Chronic cough (Primary) #post-infectious cough     She has a subacute post-infectious cough following pneumonia diagnosed in September. The cough persists, mainly at night and after talking. Previous treatments included antibiotics, prednisone , and inhalers. Current treatment with famotidine  for possible acid reflux has shown some improvement. There is no evidence of asthma on physical exam, though reactive airways remain on the differential. I suspect the cough is subacute and post infectious, with reflux possibly a contributor.   She was prescribed advair a month ago by Dr. Isadora for a subacute/chronic cough but her cough persisted. She does have wheezing on exam and reported a strong family history of asthma. She also reported a short period of tobacco use in her 30s. I will step up therapy to Trelegy 200 1 puff daily. Prescribe a short course of prednisone  and schedule her for a PFT   #Pulmonary nodule     She has a pulmonary nodule (10 mm LLL solid nodule, and a GGO in the RLL). Neither are spiculated, and have no FDG avidity on PET. Both nodules have been stable on repeat chest imaging. I had discussed these findings with the patient during our prior visit, and she opted for continued radiographic monitoring.   Plan for follow-up scan for January 2026. A recent chest x-ray post-pneumonia showed improvement. No current need for additional imaging  as the recent infection may obscure results.   -Schedule a CT scan for January 2026.  -Schedule a follow-up appointment after the CT scan to evaluate the pulmonary nodule and cough.      Return in about 6 weeks (around 02/13/2024) for With Dr. Isadora.  I personally spent a total of 40 minutes in the care of the patient today including preparing to see the patient, getting/reviewing separately obtained  history, performing a medically appropriate exam/evaluation, counseling and educating, placing orders, documenting clinical information in the EHR, independently interpreting results, and communicating results.   Darrin Barn, MD [facility] 01/02/2024 5:32 PM

## 2024-01-14 ENCOUNTER — Ambulatory Visit (INDEPENDENT_AMBULATORY_CARE_PROVIDER_SITE_OTHER)

## 2024-01-14 DIAGNOSIS — R0602 Shortness of breath: Secondary | ICD-10-CM | POA: Diagnosis not present

## 2024-01-14 LAB — PULMONARY FUNCTION TEST
DL/VA % pred: 119 %
DL/VA: 4.89 ml/min/mmHg/L
DLCO unc % pred: 132 %
DLCO unc: 25.7 ml/min/mmHg
FEF 25-75 Post: 2.45 L/s
FEF 25-75 Pre: 2.13 L/s
FEF2575-%Change-Post: 14 %
FEF2575-%Pred-Post: 151 %
FEF2575-%Pred-Pre: 131 %
FEV1-%Change-Post: 1 %
FEV1-%Pred-Post: 116 %
FEV1-%Pred-Pre: 115 %
FEV1-Post: 2.5 L
FEV1-Pre: 2.47 L
FEV1FVC-%Change-Post: 2 %
FEV1FVC-%Pred-Pre: 106 %
FEV6-%Change-Post: -2 %
FEV6-%Pred-Post: 111 %
FEV6-%Pred-Pre: 114 %
FEV6-Post: 3.03 L
FEV6-Pre: 3.11 L
FEV6FVC-%Change-Post: 0 %
FEV6FVC-%Pred-Post: 105 %
FEV6FVC-%Pred-Pre: 104 %
FVC-%Change-Post: 0 %
FVC-%Pred-Post: 108 %
FVC-%Pred-Pre: 109 %
FVC-Post: 3.09 L
FVC-Pre: 3.12 L
Post FEV1/FVC ratio: 81 %
Post FEV6/FVC ratio: 100 %
Pre FEV1/FVC ratio: 79 %
Pre FEV6/FVC Ratio: 100 %
RV % pred: 129 %
RV: 3.09 L
TLC % pred: 120 %
TLC: 6.28 L

## 2024-01-14 NOTE — Patient Instructions (Signed)
 Full PFT completed today ? ?

## 2024-01-14 NOTE — Progress Notes (Signed)
 Full PFT completed today ? ?

## 2024-01-28 ENCOUNTER — Ambulatory Visit
Admission: RE | Admit: 2024-01-28 | Discharge: 2024-01-28 | Disposition: A | Discharge status: Home / Self Care | Source: Ambulatory Visit | Attending: Student in an Organized Health Care Education/Training Program | Admitting: Student in an Organized Health Care Education/Training Program

## 2024-01-28 DIAGNOSIS — R911 Solitary pulmonary nodule: Secondary | ICD-10-CM | POA: Insufficient documentation

## 2024-02-02 ENCOUNTER — Encounter: Payer: Self-pay | Admitting: Student in an Organized Health Care Education/Training Program

## 2024-02-04 NOTE — Telephone Encounter (Signed)
 Copied from CRM [id number]. Topic: Clinical - Lab/Test Results >> Feb 04, 2024  1:40 PM Rilla B wrote: Reason for CRM: Patient has CT FU appt on 2/04. Patient has seen CT results and believes it's not good and wants to talk to office to go over it.. Would like to know if she can come in sooner than 2/04. (Unable to get sooner appt). Please call patient to discuss @ cell phone [phone].  Patient also stated she is still coughing (4 months) and bringing up plem. Wants to know if she can have antibiotics.

## 2024-02-04 NOTE — Telephone Encounter (Signed)
 CT done on 01/28/2024.

## 2024-02-05 ENCOUNTER — Encounter: Payer: Self-pay | Admitting: Student in an Organized Health Care Education/Training Program

## 2024-02-05 ENCOUNTER — Ambulatory Visit (INDEPENDENT_AMBULATORY_CARE_PROVIDER_SITE_OTHER): Admitting: Student in an Organized Health Care Education/Training Program

## 2024-02-05 ENCOUNTER — Other Ambulatory Visit: Payer: Self-pay

## 2024-02-05 ENCOUNTER — Telehealth: Payer: Self-pay

## 2024-02-05 VITALS — BP 116/70 | HR 91 | Temp 97.8°F | Ht 65.0 in | Wt 126.8 lb

## 2024-02-05 DIAGNOSIS — J479 Bronchiectasis, uncomplicated: Secondary | ICD-10-CM | POA: Diagnosis not present

## 2024-02-05 DIAGNOSIS — R053 Chronic cough: Secondary | ICD-10-CM | POA: Diagnosis not present

## 2024-02-05 DIAGNOSIS — R911 Solitary pulmonary nodule: Secondary | ICD-10-CM | POA: Insufficient documentation

## 2024-02-05 DIAGNOSIS — Z87891 Personal history of nicotine dependence: Secondary | ICD-10-CM | POA: Diagnosis not present

## 2024-02-05 MED ORDER — SODIUM CHLORIDE 3 % IN NEBU: INHALATION_SOLUTION | Freq: Two times a day (BID) | RESPIRATORY_TRACT | 6 refills | Status: AC

## 2024-02-05 MED ORDER — ALBUTEROL SULFATE (2.5 MG/3ML) 0.083% IN NEBU: 2.5000 mg | INHALATION_SOLUTION | Freq: Two times a day (BID) | RESPIRATORY_TRACT | 12 refills | Status: AC

## 2024-02-05 MED ORDER — CEFPODOXIME PROXETIL 200 MG PO TABS: 200.0000 mg | ORAL_TABLET | Freq: Two times a day (BID) | ORAL | 0 refills | Status: AC

## 2024-02-05 NOTE — Addendum Note (Signed)
 Addended by: VICCI EVALENE DEL on: 02/05/2024 04:26 PM   Modules accepted: Orders

## 2024-02-05 NOTE — Telephone Encounter (Signed)
 Robotic Bronch with EBUS 02/19/2024 at 11:00am. Lung Nodule [id number], [id number], [id number], [id number], [id number], [id number], [id number]  Donzell please see bronch info.  Patient is aware and email has been sent.

## 2024-02-05 NOTE — Patient Instructions (Addendum)
" °  VISIT SUMMARY: During your visit, we discussed the follow-up of your left lower lobe pulmonary nodule and your persistent cough. We reviewed your recent CT scan results and discussed the next steps for further evaluation and treatment.  YOUR PLAN: -PULMONARY NODULE, LEFT LOWER LOBE: A pulmonary nodule is a small growth in the lung. Your recent CT scan showed that the nodule in your left lower lobe has increased in size and appears more suspicious. We have scheduled a bronchoscopy in two weeks to take a closer look and perform a biopsy. You should stop taking aspirin five days before the procedure. Potential risks of the bronchoscopy, such as sore throat, bleeding, and lung deflation, were discussed.  -BRONCHIECTASIS WITH CHRONIC INFECTION: Bronchiectasis is a condition where the airways in the lungs become damaged and widened, leading to chronic infections. Your CT scan shows signs of bronchiectasis and a chronic infection, which is likely causing your persistent cough and greenish-yellow sputum. We have prescribed cefpodoxime  for one week to treat the infection. You should stop using the steamer, Mucinex, and DayQuil. Instead, use the medical-grade nebulizer with hypertonic saline and albuterol  twice daily. We also provided instructions for a lung clearance regimen, including the use of a flutter device and an incentive spirometer. A new incentive spirometer has been prescribed. Additionally, you should consume yogurt and fermented foods to maintain gut health during antibiotic treatment.  INSTRUCTIONS: Please follow up with the scheduled bronchoscopy in two weeks and stop taking aspirin five days before the procedure. Use the medical-grade nebulizer with hypertonic saline and albuterol  twice daily, and follow the lung clearance regimen as instructed. Take cefpodoxime  for one week as prescribed. Maintain your diet with yogurt and fermented foods to support gut health during antibiotic treatment.  We  will start a pulmonary clearance regimen that will include the following:  -Start with using the hypertonic saline by nebulizer -Then follow the albuterol  by nebulizer -Then use the flutter device: please do at least 10-15 blows into the device for at least 2 to 3 seconds each -And finish up with using the incentive spirometer; use for at least 10 times -After doing these, attempt to cough.  Please do these twice a day   Contains text generated by [employer].   "

## 2024-02-05 NOTE — H&P (View-Only) (Signed)
 " Assessment & Plan  #Pulmonary nodule, left lower lobe    The patient is here to discuss their imaging abnormalities which include a left lower lobe pulmonary nodule that has increased in size on her recent CT scan, appearing more suspicious with a subtle spiculation along the inferior border. This nodule was previously not FDG avid on PET and was stable on multiple repeat CT scans. The new finding of bronchiectasis, along with the enlarging nodule, raises suspicion for MAC. Malignancy is also on the differential diagnosis and will need to be ruled out, as is carcinoid.   We discussed the importance of diagnosis and staging in lung malignancies, and the approach to obtaining a tissue diagnosis which would include robotic assisted navigational bronchoscopy with endobronchial ultrasound guided sampling.  We also discussed the risks associated with the procedure which include a 2% risk of pneumothorax, infection, bleeding, and nondiagnostic procedure in detail.  I explained that patients typically are able to return home the same day of the procedure, but in rare cases admission to the hospital for observation and treatment is required.  After our discussion, the patient elected to proceed with the procedure  Recommendations:  - CT SUPER D CHEST WO CONTRAST; Future - Procedural/ Surgical Case Request: VIDEO BRONCHOSCOPY WITH ENDOBRONCHIAL NAVIGATION; Future  #Bronchiectasis #Chronic Cough  CT scan shows bronchiectasis with bronchial wall thickening and volume loss, particularly in the right middle lobe and lingula, which is new compared to her prior CT from a year ago. This would explain her symptoms of cough, sputum production, and malaise, with NTM/MAC highest on the differential.   She is using a steamer device which I have instructed her to not use, and they also have well water  at home, both of which would be risk factors. I've also asked her to stop using her Mucinex and DayQuil. Will  initiate a twice daily pulmonary clearance regimen with nebulized hypertonic saline, nebulized albuterol , flutter device, and incentive spirometry. I will also empirically treat gram negative and gram positive organisms with a course of Cefpodoxime . She is advised to consume yogurt and fermented foods to maintain gut health during antibiotic treatment.  Will review the status of her bronchiectasis and nodule on the repeat superD CT in 2 weeks. If persists, will proceed with bronchoscopy and perform a BAL in the RML and Lingula to send for culture.  - Flutter Device - Incentive spirometer - Nebulizer machine - sodium chloride  HYPERTONIC 3 % nebulizer solution; Take by nebulization in the morning and at bedtime.  Dispense: 750 mL; Refill: 6 - albuterol  (PROVENTIL ) (2.5 MG/3ML) 0.083% nebulizer solution; Take 3 mLs (2.5 mg total) by nebulization in the morning and at bedtime.  Dispense: 75 mL; Refill: 12 - cefpodoxime  (VANTIN ) 200 MG tablet; Take 1 tablet (200 mg total) by mouth 2 (two) times daily for 7 days.  Dispense: 14 tablet; Refill: 0 - Flexible Bronchoscopy   Belva November, MD [facility]  I spent 30 minutes caring for this patient today, including preparing to see the patient, obtaining a medical history , reviewing a separately obtained history, performing a medically appropriate examination and/or evaluation, counseling and educating the patient/family/caregiver, ordering medications, tests, or procedures, documenting clinical information in the electronic health record, and independently interpreting results (not separately reported/billed) and communicating results to the patient/family/caregiver  End of visit medications:  Meds ordered this encounter  Medications   sodium chloride  HYPERTONIC 3 % nebulizer solution    Sig: Take by nebulization in the morning  and at bedtime.    Dispense:  750 mL    Refill:  6   albuterol  (PROVENTIL ) (2.5 MG/3ML) 0.083% nebulizer  solution    Sig: Take 3 mLs (2.5 mg total) by nebulization in the morning and at bedtime.    Dispense:  75 mL    Refill:  12   cefpodoxime  (VANTIN ) 200 MG tablet    Sig: Take 1 tablet (200 mg total) by mouth 2 (two) times daily for 7 days.    Dispense:  14 tablet    Refill:  0    Current Medications[1]   Subjective:   PATIENT ID: Dawn Wolfe: female DOB: 19-Apr-1945, MRN: [id number]  Chief Complaint  Patient presents with   Follow-up    Nodule. CT results. Cough with green/yellow sputum. Trelegy- daily, helped in the beginning. Albuterol - 1-2 times daily    HPI  Discussed the use of AI scribe software for clinical note transcription with the patient, who gave verbal consent to proceed.  History of Present Illness Dawn Wolfe is a 79 year old female who presents for follow-up.  Return Visit 11/14/2023:  She has been experiencing a persistent cough since being diagnosed with pneumonia in September. Initially, antibiotics and prednisone  provided temporary relief, but the cough persisted, especially at night and during activities like talking or watching TV. She was later prescribed another course of antibiotics, an inhaler, and Mucinex by her PCP's office, which led to some improvement. Recently, the cough has become less frequent, and she has not used her albuterol  inhaler in about a week, although she continues to carry it. She was prescribed Symbicort  but did not take it due to cost.   She reported experiencing reflux symptoms after eating banana sandwiches, which resolved with Tums. She has been taking famotidine  20 mg at night since last Friday, which she believes has helped reduce the cough.   Her family history includes her mother having bronchial asthma and COPD, and her sister having asthma and COPD, despite never smoking. She herself has never had asthma or any breathing issues prior to the pneumonia.   She has a history of a pulmonary nodule that has  been stable on radiographic follow-up, with a scan scheduled for January 2026. She get a coronary calcium screen 09/06/2021 which was noted to have a solid pulmonary nodule in the left lower lobe and a groundglass nodule in the right lower lobe. We had ordered a dedicated CT of the chest and a PET/CT and patient decided to proceed with radiographic monitoring of the nodules rather than go for biopsy. She does have a history of sleep apnea and sees Madelin Stank, NP for it.  She reports compliance with her CPAP and that it has made her sleep and energy levels a lot better.  Return Visit 02/05/2024:  She has been monitored for a left lower lobe pulmonary nodule since September 2023. Multiple CT scans have been conducted in September 2023, January 2024, July 2024, January 2025, and most recently in January 2026. Initially, the nodule was stable, but the latest CT scan shows an increase in size with subtle spiculations along the inferior border.  In October 2025, she developed a subacute cough following a pneumonia diagnosis in September. The cough persisted despite treatment with antibiotics, prednisone , and inhalers. She was started on ICS/LABA at that time. Currently, the cough persists, especially when talking, but she sleeps well at night. She uses cough medication and a steamer three to four times a day,  which she feels helps. She also uses a steamer with salt solution and had been taking Mucinex, which she stopped after reading the label. She was given a sample for Trelegy, which initially helped with the cough, but this was short lived and symptoms recurred. She reports coughing up 'yucky, greenish-yellow' sputum. She feels tired and is fatigued, which is quite unusual for her.  She is on pravastatin  20 mg and a baby aspirin. She uses a treadmill for exercise and monitors her oxygen levels, which are consistently around 97%.  She used to work an paramedic job and denies any inhalational exposures.  She is a  non-smoker and does not drink.   Ancillary information including prior medications, full medical/surgical/family/social histories, and PFTs (when available) are listed below and have been reviewed.   Review of Systems  Constitutional:  Positive for malaise/fatigue. Negative for chills, fever and weight loss.  Respiratory:  Positive for cough. Negative for hemoptysis, sputum production and shortness of breath.   Cardiovascular:  Negative for chest pain.     Objective:   Vitals:   02/05/24 1533  BP: 116/70  Pulse: 91  Temp: 97.8 F (36.6 C)  SpO2: 98%  Weight: 126 lb 12.8 oz (57.5 kg)  Height: 5' 5 (1.651 m)   98% on RA BMI Readings from Last 3 Encounters:  02/05/24 21.10 kg/m  01/14/24 21.40 kg/m  01/02/24 21.47 kg/m   Wt Readings from Last 3 Encounters:  02/05/24 126 lb 12.8 oz (57.5 kg)  01/14/24 128 lb 9.6 oz (58.3 kg)  01/02/24 129 lb (58.5 kg)    .vitalsmbmi  Physical Exam Constitutional:      Appearance: Normal appearance.  Cardiovascular:     Rate and Rhythm: Normal rate and regular rhythm.  Pulmonary:     Effort: Pulmonary effort is normal.     Breath sounds: Normal breath sounds.  Neurological:     General: No focal deficit present.     Mental Status: She is alert and oriented to person, place, and time. Mental status is at baseline.     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 Surgical removal   Cataract removed by [facility] 2017   Complication of anesthesia    Glaucoma borderline per Dr. Manus Edison   Hemorrhoids    History of colonoscopy 2003   Normal, Dr. Nolon, [place]    Hypercholesterolemia    Normal exercise sestamibi stress test 2008   Ken Fath   Obstructive sleep apnea on CPAP 2007   PONV (postoperative nausea and vomiting)    Rotator  cuff tear, left    Sleep apnea use cpap machine  15 yrs     Family History  Problem Relation Age of Onset   Acute myelogenous leukemia Sister    Polycythemia Sister 49   Asthma Sister    Varicose Veins Sister    Emphysema Father        smoker   Heart disease Father        myocardial infarction - 34   COPD Father        smoking   Heart attack Father    Breast cancer Paternal Aunt    Testicular cancer Brother    Cancer Brother  testicular   Early death Brother        cancer   Heart disease Mother    Emphysema Mother    COPD Mother        second hand smoke   Asthma Mother    Heart attack Mother    Leukemia Sister    Cancer Sister        leukemia   Colon cancer Neg Hx      Past Surgical History:  Procedure Laterality Date   ABDOMINALPLASTY W/ LIPOSUCTION  AGE 27   BUNIONECTOMY  X3  LAST ONE 2003   CHOLECYSTECTOMY N/A 04/19/2015   Procedure: LAPAROSCOPIC CHOLECYSTECTOMY WITH INTRAOPERATIVE CHOLANGIOGRAM;  Surgeon: Reyes LELON Cota, MD;  Location: [facility] ORS;  Service: General;  Laterality: N/A;   COLONOSCOPY  2012   Dr Oneil Louder in [place]   COLONOSCOPY WITH PROPOFOL  N/A 09/11/2016   Procedure: COLONOSCOPY WITH PROPOFOL ;  Surgeon: Gaylyn Gladis PENNER, MD;  Location[facility] ENDOSCOPY;  Service: Endoscopy;  Laterality: N/A;   ERCP N/A 04/29/2015   Procedure: ENDOSCOPIC RETROGRADE CHOLANGIOPANCREATOGRAPHY (ERCP);  Surgeon: Deward CINDERELLA Piedmont, MD;  Location: [facility] ENDOSCOPY;  Service: Gastroenterology;  Laterality: N/A;   EYE SURGERY  cataracts  surgery 2017   cataract extraction   NASAL SEPTUM SURGERY  AGE 5   NASAL SINUS SURGERY  AGE 31   SHOULDER ARTHROSCOPY  10/25/2011   Procedure: ARTHROSCOPY SHOULDER;  Surgeon: Lynwood SHAUNNA Bern, MD;  Location: [facility] [place];  Service: Orthopedics;  Laterality: Left;  with SAD, shave of the labrium    TUBAL LIGATION  1982    Social History   Socioeconomic History   Marital status: Married    Spouse name: Not on  file   Number of children: Not on file   Years of education: Not on file   Highest education level: Not on file  Occupational History   Not on file  Tobacco Use   Smoking status: Former    Current packs/day: 0.00    Average packs/day: 2.5 packs/day for 10.0 years (25.0 ttl pk-yrs)    Types: Cigarettes    Start date: 03/29/1967    Quit date: 03/28/1977    Years since quitting: 46.8   Smokeless tobacco: Never  Substance and Sexual Activity   Alcohol use: No    Alcohol/week: 0.0 standard drinks of alcohol   Drug use: No   Sexual activity: Not Currently    Birth control/protection: Post-menopausal  Other Topics Concern   Not on file  Social History Narrative   Not on file   Social Drivers of Health   Tobacco Use: Medium Risk (02/05/2024)   Patient History    Smoking Tobacco Use: Former    Smokeless Tobacco Use: Never    Passive Exposure: Not on Actuary Strain: Low Risk (04/23/2023)   Overall Financial Resource Strain (CARDIA)    Difficulty of Paying Living Expenses: Not hard at all  Food Insecurity: No Food Insecurity (04/23/2023)   Hunger Vital Sign    Worried About Running Out of Food in the Last Year: Never true    Ran Out of Food in the Last Year: Never true  Transportation Needs: No Transportation Needs (04/23/2023)   PRAPARE - Administrator, Civil Service (Medical): No    Lack of Transportation (Non-Medical): No  Physical Activity: Insufficiently Active (04/23/2023)   Exercise Vital Sign    Days of Exercise per Week: 3 days    Minutes of Exercise per Session: 40  min  Stress: No Stress Concern Present (04/23/2023)   [employer] of Occupational Health - Occupational Stress Questionnaire    Feeling of Stress : Not at all  Social Connections: Socially Integrated (04/23/2023)   Social Connection and Isolation Panel    Frequency of Communication with Friends and Family: More than three times a week    Frequency of Social Gatherings with Friends  and Family: More than three times a week    Attends Religious Services: More than 4 times per year    Active Member of Clubs or Organizations: Yes    Attends Banker Meetings: Never    Marital Status: Married  Catering Manager Violence: Not At Risk (04/23/2023)   Humiliation, Afraid, Rape, and Kick questionnaire    Fear of Current or Ex-Partner: No    Emotionally Abused: No    Physically Abused: No    Sexually Abused: No  Depression (PHQ2-9): Low Risk (11/09/2023)   Depression (PHQ2-9)    PHQ-2 Score: 0  Alcohol Screen: Low Risk (04/23/2023)   Alcohol Screen    Last Alcohol Screening Score (AUDIT): 0  Housing: Unknown (06/27/2023)   Received from [facility]   Epic    Unable to Pay for Housing in the Last Year: Not on file    Number of Times Moved in the Last Year: Not on file    At any time in the past 12 months, were you homeless or living in a shelter (including now)?: No  Utilities: Not At Risk (04/23/2023)   [employer] Utilities    Threatened with loss of utilities: No  Health Literacy: Adequate Health Literacy (04/23/2023)   B1300 Health Literacy    Frequency of need for help with medical instructions: Never     Allergies[2]   CBC    Component Value Date/Time   WBC 5.5 06/01/2023 1035   RBC 4.14 06/01/2023 1035   HGB 12.3 06/01/2023 1035   HCT 36.5 06/01/2023 1035   PLT 151.0 06/01/2023 1035   MCV 88.3 06/01/2023 1035   MCH 29.3 01/27/2022 1022   MCHC 33.5 06/01/2023 1035   RDW 13.5 06/01/2023 1035   LYMPHSABS 1.5 06/01/2023 1035   MONOABS 0.4 06/01/2023 1035   EOSABS 0.2 06/01/2023 1035   BASOSABS 0.0 06/01/2023 1035    Pulmonary Functions Testing Results:    Latest Ref Rng & Units 01/14/2024   10:25 AM  PFT Results  FVC-Pre L 3.12   FVC-Predicted Pre % 109   FVC-Post L 3.09   FVC-Predicted Post % 108   Pre FEV1/FVC % % 79   Post FEV1/FCV % % 81   FEV1-Pre L 2.47   FEV1-Predicted Pre % 115   FEV1-Post L 2.50   DLCO uncorrected  ml/min/mmHg 25.70   DLCO UNC% % 132   DLVA Predicted % 119   TLC L 6.28   TLC % Predicted % 120   RV % Predicted % 129     Outpatient Medications Prior to Visit  Medication Sig Dispense Refill   acidophilus (RISAQUAD) CAPS capsule Take 1 capsule by mouth daily.     albuterol  (VENTOLIN  HFA) 108 (90 Base) MCG/ACT inhaler Inhale 2 puffs into the lungs every 6 (six) hours as needed for wheezing or shortness of breath. 8 g 2   aspirin EC 81 MG tablet Take 81 mg by mouth daily.     Coenzyme Q10 (COQ10 PO) Take by mouth daily.     Cyanocobalamin  (VITAMIN B-12) 1000 MCG SUBL Place 1,000  mcg under the tongue daily.     ferrous sulfate 325 (65 FE) MG EC tablet Take 325 mg by mouth daily with breakfast.     Fluticasone -Umeclidin-Vilant (TRELEGY ELLIPTA ) 200-62.5-25 MCG/ACT AEPB Inhale 1 puff into the lungs daily. 3 each 6   latanoprost (XALATAN) 0.005 % ophthalmic solution Place 1 drop into the left eye.  3   magnesium gluconate (MAGONATE) 500 MG tablet Take 1,000 mg by mouth daily.     Multiple Vitamin (MULTIVITAMIN) tablet Take 1 tablet by mouth daily.     POTASSIUM PO Take by mouth.     pravastatin  (PRAVACHOL ) 20 MG tablet Take 1 tablet (20 mg total) by mouth daily. 90 tablet 3   budesonide  (PULMICORT ) 0.25 MG/2ML nebulizer solution Take 2 mLs (0.25 mg total) by nebulization daily as needed. (Patient not taking: Reported on 02/05/2024) 60 mL 12   Fluticasone -Umeclidin-Vilant (TRELEGY ELLIPTA ) 200-62.5-25 MCG/ACT AEPB Inhale 1 puff into the lungs daily. (Patient not taking: Reported on 02/05/2024) 14 each 0   predniSONE  (DELTASONE ) 20 MG tablet Take 1 tablet (20 mg total) by mouth daily. (Patient not taking: Reported on 02/05/2024) 5 tablet 0   albuterol  (VENTOLIN  HFA) 108 (90 Base) MCG/ACT inhaler Inhale 2 puffs into the lungs every 6 (six) hours as needed for wheezing or shortness of breath. (Patient not taking: Reported on 02/05/2024) 18 g 0   No facility-administered medications prior to visit.       [1]  Current Outpatient Medications:    acidophilus (RISAQUAD) CAPS capsule, Take 1 capsule by mouth daily., Disp: , Rfl:    albuterol  (PROVENTIL ) (2.5 MG/3ML) 0.083% nebulizer solution, Take 3 mLs (2.5 mg total) by nebulization in the morning and at bedtime., Disp: 75 mL, Rfl: 12   albuterol  (VENTOLIN  HFA) 108 (90 Base) MCG/ACT inhaler, Inhale 2 puffs into the lungs every 6 (six) hours as needed for wheezing or shortness of breath., Disp: 8 g, Rfl: 2   aspirin EC 81 MG tablet, Take 81 mg by mouth daily., Disp: , Rfl:    cefpodoxime  (VANTIN ) 200 MG tablet, Take 1 tablet (200 mg total) by mouth 2 (two) times daily for 7 days., Disp: 14 tablet, Rfl: 0   Coenzyme Q10 (COQ10 PO), Take by mouth daily., Disp: , Rfl:    Cyanocobalamin  (VITAMIN B-12) 1000 MCG SUBL, Place 1,000 mcg under the tongue daily., Disp: , Rfl:    ferrous sulfate 325 (65 FE) MG EC tablet, Take 325 mg by mouth daily with breakfast., Disp: , Rfl:    Fluticasone -Umeclidin-Vilant (TRELEGY ELLIPTA ) 200-62.5-25 MCG/ACT AEPB, Inhale 1 puff into the lungs daily., Disp: 3 each, Rfl: 6   latanoprost (XALATAN) 0.005 % ophthalmic solution, Place 1 drop into the left eye., Disp: , Rfl: 3   magnesium gluconate (MAGONATE) 500 MG tablet, Take 1,000 mg by mouth daily., Disp: , Rfl:    Multiple Vitamin (MULTIVITAMIN) tablet, Take 1 tablet by mouth daily., Disp: , Rfl:    POTASSIUM PO, Take by mouth., Disp: , Rfl:    pravastatin  (PRAVACHOL ) 20 MG tablet, Take 1 tablet (20 mg total) by mouth daily., Disp: 90 tablet, Rfl: 3   sodium chloride  HYPERTONIC 3 % nebulizer solution, Take by nebulization in the morning and at bedtime., Disp: 750 mL, Rfl: 6   budesonide  (PULMICORT ) 0.25 MG/2ML nebulizer solution, Take 2 mLs (0.25 mg total) by nebulization daily as needed. (Patient not taking: Reported on 02/05/2024), Disp: 60 mL, Rfl: 12   Fluticasone -Umeclidin-Vilant (TRELEGY ELLIPTA ) 200-62.5-25 MCG/ACT AEPB, Inhale 1 puff into  the lungs daily.  (Patient not taking: Reported on 02/05/2024), Disp: 14 each, Rfl: 0   predniSONE  (DELTASONE ) 20 MG tablet, Take 1 tablet (20 mg total) by mouth daily. (Patient not taking: Reported on 02/05/2024), Disp: 5 tablet, Rfl: 0 [2]  Allergies Allergen Reactions   Codeine Nausea And Vomiting   "

## 2024-02-05 NOTE — Telephone Encounter (Signed)
 Per secure chat with Dr. Isadora- would it be possible to put her in for an add on visit today?   I spoke with the patient and scheduled her an appt for 3:00pm today.   Nothing further needed.

## 2024-02-05 NOTE — Progress Notes (Signed)
 " Assessment & Plan  #Pulmonary nodule, left lower lobe    The patient is here to discuss their imaging abnormalities which include a left lower lobe pulmonary nodule that has increased in size on her recent CT scan, appearing more suspicious with a subtle spiculation along the inferior border. This nodule was previously not FDG avid on PET and was stable on multiple repeat CT scans. The new finding of bronchiectasis, along with the enlarging nodule, raises suspicion for MAC. Malignancy is also on the differential diagnosis and will need to be ruled out, as is carcinoid.   We discussed the importance of diagnosis and staging in lung malignancies, and the approach to obtaining a tissue diagnosis which would include robotic assisted navigational bronchoscopy with endobronchial ultrasound guided sampling.  We also discussed the risks associated with the procedure which include a 2% risk of pneumothorax, infection, bleeding, and nondiagnostic procedure in detail.  I explained that patients typically are able to return home the same day of the procedure, but in rare cases admission to the hospital for observation and treatment is required.  After our discussion, the patient elected to proceed with the procedure  Recommendations:  - CT SUPER D CHEST WO CONTRAST; Future - Procedural/ Surgical Case Request: VIDEO BRONCHOSCOPY WITH ENDOBRONCHIAL NAVIGATION; Future  #Bronchiectasis #Chronic Cough  CT scan shows bronchiectasis with bronchial wall thickening and volume loss, particularly in the right middle lobe and lingula, which is new compared to her prior CT from a year ago. This would explain her symptoms of cough, sputum production, and malaise, with NTM/MAC highest on the differential.   She is using a steamer device which I have instructed her to not use, and they also have well water  at home, both of which would be risk factors. I've also asked her to stop using her Mucinex and DayQuil. Will  initiate a twice daily pulmonary clearance regimen with nebulized hypertonic saline, nebulized albuterol , flutter device, and incentive spirometry. I will also empirically treat gram negative and gram positive organisms with a course of Cefpodoxime . She is advised to consume yogurt and fermented foods to maintain gut health during antibiotic treatment.  Will review the status of her bronchiectasis and nodule on the repeat superD CT in 2 weeks. If persists, will proceed with bronchoscopy and perform a BAL in the RML and Lingula to send for culture.  - Flutter Device - Incentive spirometer - Nebulizer machine - sodium chloride  HYPERTONIC 3 % nebulizer solution; Take by nebulization in the morning and at bedtime.  Dispense: 750 mL; Refill: 6 - albuterol  (PROVENTIL ) (2.5 MG/3ML) 0.083% nebulizer solution; Take 3 mLs (2.5 mg total) by nebulization in the morning and at bedtime.  Dispense: 75 mL; Refill: 12 - cefpodoxime  (VANTIN ) 200 MG tablet; Take 1 tablet (200 mg total) by mouth 2 (two) times daily for 7 days.  Dispense: 14 tablet; Refill: 0 - Flexible Bronchoscopy   Belva November, MD [facility]  I spent 30 minutes caring for this patient today, including preparing to see the patient, obtaining a medical history , reviewing a separately obtained history, performing a medically appropriate examination and/or evaluation, counseling and educating the patient/family/caregiver, ordering medications, tests, or procedures, documenting clinical information in the electronic health record, and independently interpreting results (not separately reported/billed) and communicating results to the patient/family/caregiver  End of visit medications:  Meds ordered this encounter  Medications   sodium chloride  HYPERTONIC 3 % nebulizer solution    Sig: Take by nebulization in the morning  and at bedtime.    Dispense:  750 mL    Refill:  6   albuterol  (PROVENTIL ) (2.5 MG/3ML) 0.083% nebulizer  solution    Sig: Take 3 mLs (2.5 mg total) by nebulization in the morning and at bedtime.    Dispense:  75 mL    Refill:  12   cefpodoxime  (VANTIN ) 200 MG tablet    Sig: Take 1 tablet (200 mg total) by mouth 2 (two) times daily for 7 days.    Dispense:  14 tablet    Refill:  0    Current Medications[1]   Subjective:   PATIENT ID: Dawn Wolfe Moles GENDER: female DOB: 19-Apr-1945, MRN: [id number]  Chief Complaint  Patient presents with   Follow-up    Nodule. CT results. Cough with green/yellow sputum. Trelegy- daily, helped in the beginning. Albuterol - 1-2 times daily    HPI  Discussed the use of AI scribe software for clinical note transcription with the patient, who gave verbal consent to proceed.  History of Present Illness Dawn Wolfe is a 79 year old female who presents for follow-up.  Return Visit 11/14/2023:  She has been experiencing a persistent cough since being diagnosed with pneumonia in September. Initially, antibiotics and prednisone  provided temporary relief, but the cough persisted, especially at night and during activities like talking or watching TV. She was later prescribed another course of antibiotics, an inhaler, and Mucinex by her PCP's office, which led to some improvement. Recently, the cough has become less frequent, and she has not used her albuterol  inhaler in about a week, although she continues to carry it. She was prescribed Symbicort  but did not take it due to cost.   She reported experiencing reflux symptoms after eating banana sandwiches, which resolved with Tums. She has been taking famotidine  20 mg at night since last Friday, which she believes has helped reduce the cough.   Her family history includes her mother having bronchial asthma and COPD, and her sister having asthma and COPD, despite never smoking. She herself has never had asthma or any breathing issues prior to the pneumonia.   She has a history of a pulmonary nodule that has  been stable on radiographic follow-up, with a scan scheduled for January 2026. She get a coronary calcium screen 09/06/2021 which was noted to have a solid pulmonary nodule in the left lower lobe and a groundglass nodule in the right lower lobe. We had ordered a dedicated CT of the chest and a PET/CT and patient decided to proceed with radiographic monitoring of the nodules rather than go for biopsy. She does have a history of sleep apnea and sees Madelin Stank, NP for it.  She reports compliance with her CPAP and that it has made her sleep and energy levels a lot better.  Return Visit 02/05/2024:  She has been monitored for a left lower lobe pulmonary nodule since September 2023. Multiple CT scans have been conducted in September 2023, January 2024, July 2024, January 2025, and most recently in January 2026. Initially, the nodule was stable, but the latest CT scan shows an increase in size with subtle spiculations along the inferior border.  In October 2025, she developed a subacute cough following a pneumonia diagnosis in September. The cough persisted despite treatment with antibiotics, prednisone , and inhalers. She was started on ICS/LABA at that time. Currently, the cough persists, especially when talking, but she sleeps well at night. She uses cough medication and a steamer three to four times a day,  which she feels helps. She also uses a steamer with salt solution and had been taking Mucinex, which she stopped after reading the label. She was given a sample for Trelegy, which initially helped with the cough, but this was short lived and symptoms recurred. She reports coughing up 'yucky, greenish-yellow' sputum. She feels tired and is fatigued, which is quite unusual for her.  She is on pravastatin  20 mg and a baby aspirin. She uses a treadmill for exercise and monitors her oxygen levels, which are consistently around 97%.  She used to work an paramedic job and denies any inhalational exposures.  She is a  non-smoker and does not drink.   Ancillary information including prior medications, full medical/surgical/family/social histories, and PFTs (when available) are listed below and have been reviewed.   Review of Systems  Constitutional:  Positive for malaise/fatigue. Negative for chills, fever and weight loss.  Respiratory:  Positive for cough. Negative for hemoptysis, sputum production and shortness of breath.   Cardiovascular:  Negative for chest pain.     Objective:   Vitals:   02/05/24 1533  BP: 116/70  Pulse: 91  Temp: 97.8 F (36.6 C)  SpO2: 98%  Weight: 126 lb 12.8 oz (57.5 kg)  Height: 5' 5 (1.651 m)   98% on RA BMI Readings from Last 3 Encounters:  02/05/24 21.10 kg/m  01/14/24 21.40 kg/m  01/02/24 21.47 kg/m   Wt Readings from Last 3 Encounters:  02/05/24 126 lb 12.8 oz (57.5 kg)  01/14/24 128 lb 9.6 oz (58.3 kg)  01/02/24 129 lb (58.5 kg)    .vitalsmbmi  Physical Exam Constitutional:      Appearance: Normal appearance.  Cardiovascular:     Rate and Rhythm: Normal rate and regular rhythm.  Pulmonary:     Effort: Pulmonary effort is normal.     Breath sounds: Normal breath sounds.  Neurological:     General: No focal deficit present.     Mental Status: She is alert and oriented to person, place, and time. Mental status is at baseline.     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 Surgical removal   Cataract removed by [facility] 2017   Complication of anesthesia    Glaucoma borderline per Dr. Manus Edison   Hemorrhoids    History of colonoscopy 2003   Normal, Dr. Nolon, [place]    Hypercholesterolemia    Normal exercise sestamibi stress test 2008   Ken Fath   Obstructive sleep apnea on CPAP 2007   PONV (postoperative nausea and vomiting)    Rotator  cuff tear, left    Sleep apnea use cpap machine  15 yrs     Family History  Problem Relation Age of Onset   Acute myelogenous leukemia Sister    Polycythemia Sister 49   Asthma Sister    Varicose Veins Sister    Emphysema Father        smoker   Heart disease Father        myocardial infarction - 34   COPD Father        smoking   Heart attack Father    Breast cancer Paternal Aunt    Testicular cancer Brother    Cancer Brother  testicular   Early death Brother        cancer   Heart disease Mother    Emphysema Mother    COPD Mother        second hand smoke   Asthma Mother    Heart attack Mother    Leukemia Sister    Cancer Sister        leukemia   Colon cancer Neg Hx      Past Surgical History:  Procedure Laterality Date   ABDOMINALPLASTY W/ LIPOSUCTION  AGE 27   BUNIONECTOMY  X3  LAST ONE 2003   CHOLECYSTECTOMY N/A 04/19/2015   Procedure: LAPAROSCOPIC CHOLECYSTECTOMY WITH INTRAOPERATIVE CHOLANGIOGRAM;  Surgeon: Reyes LELON Cota, MD;  Location: [facility] ORS;  Service: General;  Laterality: N/A;   COLONOSCOPY  2012   Dr Oneil Louder in [place]   COLONOSCOPY WITH PROPOFOL  N/A 09/11/2016   Procedure: COLONOSCOPY WITH PROPOFOL ;  Surgeon: Gaylyn Gladis PENNER, MD;  Location[facility] ENDOSCOPY;  Service: Endoscopy;  Laterality: N/A;   ERCP N/A 04/29/2015   Procedure: ENDOSCOPIC RETROGRADE CHOLANGIOPANCREATOGRAPHY (ERCP);  Surgeon: Deward CINDERELLA Piedmont, MD;  Location: [facility] ENDOSCOPY;  Service: Gastroenterology;  Laterality: N/A;   EYE SURGERY  cataracts  surgery 2017   cataract extraction   NASAL SEPTUM SURGERY  AGE 5   NASAL SINUS SURGERY  AGE 31   SHOULDER ARTHROSCOPY  10/25/2011   Procedure: ARTHROSCOPY SHOULDER;  Surgeon: Lynwood SHAUNNA Bern, MD;  Location: [facility] [place];  Service: Orthopedics;  Laterality: Left;  with SAD, shave of the labrium    TUBAL LIGATION  1982    Social History   Socioeconomic History   Marital status: Married    Spouse name: Not on  file   Number of children: Not on file   Years of education: Not on file   Highest education level: Not on file  Occupational History   Not on file  Tobacco Use   Smoking status: Former    Current packs/day: 0.00    Average packs/day: 2.5 packs/day for 10.0 years (25.0 ttl pk-yrs)    Types: Cigarettes    Start date: 03/29/1967    Quit date: 03/28/1977    Years since quitting: 46.8   Smokeless tobacco: Never  Substance and Sexual Activity   Alcohol use: No    Alcohol/week: 0.0 standard drinks of alcohol   Drug use: No   Sexual activity: Not Currently    Birth control/protection: Post-menopausal  Other Topics Concern   Not on file  Social History Narrative   Not on file   Social Drivers of Health   Tobacco Use: Medium Risk (02/05/2024)   Patient History    Smoking Tobacco Use: Former    Smokeless Tobacco Use: Never    Passive Exposure: Not on Actuary Strain: Low Risk (04/23/2023)   Overall Financial Resource Strain (CARDIA)    Difficulty of Paying Living Expenses: Not hard at all  Food Insecurity: No Food Insecurity (04/23/2023)   Hunger Vital Sign    Worried About Running Out of Food in the Last Year: Never true    Ran Out of Food in the Last Year: Never true  Transportation Needs: No Transportation Needs (04/23/2023)   PRAPARE - Administrator, Civil Service (Medical): No    Lack of Transportation (Non-Medical): No  Physical Activity: Insufficiently Active (04/23/2023)   Exercise Vital Sign    Days of Exercise per Week: 3 days    Minutes of Exercise per Session: 40  min  Stress: No Stress Concern Present (04/23/2023)   [employer] of Occupational Health - Occupational Stress Questionnaire    Feeling of Stress : Not at all  Social Connections: Socially Integrated (04/23/2023)   Social Connection and Isolation Panel    Frequency of Communication with Friends and Family: More than three times a week    Frequency of Social Gatherings with Friends  and Family: More than three times a week    Attends Religious Services: More than 4 times per year    Active Member of Clubs or Organizations: Yes    Attends Banker Meetings: Never    Marital Status: Married  Catering Manager Violence: Not At Risk (04/23/2023)   Humiliation, Afraid, Rape, and Kick questionnaire    Fear of Current or Ex-Partner: No    Emotionally Abused: No    Physically Abused: No    Sexually Abused: No  Depression (PHQ2-9): Low Risk (11/09/2023)   Depression (PHQ2-9)    PHQ-2 Score: 0  Alcohol Screen: Low Risk (04/23/2023)   Alcohol Screen    Last Alcohol Screening Score (AUDIT): 0  Housing: Unknown (06/27/2023)   Received from [facility]   Epic    Unable to Pay for Housing in the Last Year: Not on file    Number of Times Moved in the Last Year: Not on file    At any time in the past 12 months, were you homeless or living in a shelter (including now)?: No  Utilities: Not At Risk (04/23/2023)   [employer] Utilities    Threatened with loss of utilities: No  Health Literacy: Adequate Health Literacy (04/23/2023)   B1300 Health Literacy    Frequency of need for help with medical instructions: Never     Allergies[2]   CBC    Component Value Date/Time   WBC 5.5 06/01/2023 1035   RBC 4.14 06/01/2023 1035   HGB 12.3 06/01/2023 1035   HCT 36.5 06/01/2023 1035   PLT 151.0 06/01/2023 1035   MCV 88.3 06/01/2023 1035   MCH 29.3 01/27/2022 1022   MCHC 33.5 06/01/2023 1035   RDW 13.5 06/01/2023 1035   LYMPHSABS 1.5 06/01/2023 1035   MONOABS 0.4 06/01/2023 1035   EOSABS 0.2 06/01/2023 1035   BASOSABS 0.0 06/01/2023 1035    Pulmonary Functions Testing Results:    Latest Ref Rng & Units 01/14/2024   10:25 AM  PFT Results  FVC-Pre L 3.12   FVC-Predicted Pre % 109   FVC-Post L 3.09   FVC-Predicted Post % 108   Pre FEV1/FVC % % 79   Post FEV1/FCV % % 81   FEV1-Pre L 2.47   FEV1-Predicted Pre % 115   FEV1-Post L 2.50   DLCO uncorrected  ml/min/mmHg 25.70   DLCO UNC% % 132   DLVA Predicted % 119   TLC L 6.28   TLC % Predicted % 120   RV % Predicted % 129     Outpatient Medications Prior to Visit  Medication Sig Dispense Refill   acidophilus (RISAQUAD) CAPS capsule Take 1 capsule by mouth daily.     albuterol  (VENTOLIN  HFA) 108 (90 Base) MCG/ACT inhaler Inhale 2 puffs into the lungs every 6 (six) hours as needed for wheezing or shortness of breath. 8 g 2   aspirin EC 81 MG tablet Take 81 mg by mouth daily.     Coenzyme Q10 (COQ10 PO) Take by mouth daily.     Cyanocobalamin  (VITAMIN B-12) 1000 MCG SUBL Place 1,000  mcg under the tongue daily.     ferrous sulfate 325 (65 FE) MG EC tablet Take 325 mg by mouth daily with breakfast.     Fluticasone -Umeclidin-Vilant (TRELEGY ELLIPTA ) 200-62.5-25 MCG/ACT AEPB Inhale 1 puff into the lungs daily. 3 each 6   latanoprost (XALATAN) 0.005 % ophthalmic solution Place 1 drop into the left eye.  3   magnesium gluconate (MAGONATE) 500 MG tablet Take 1,000 mg by mouth daily.     Multiple Vitamin (MULTIVITAMIN) tablet Take 1 tablet by mouth daily.     POTASSIUM PO Take by mouth.     pravastatin  (PRAVACHOL ) 20 MG tablet Take 1 tablet (20 mg total) by mouth daily. 90 tablet 3   budesonide  (PULMICORT ) 0.25 MG/2ML nebulizer solution Take 2 mLs (0.25 mg total) by nebulization daily as needed. (Patient not taking: Reported on 02/05/2024) 60 mL 12   Fluticasone -Umeclidin-Vilant (TRELEGY ELLIPTA ) 200-62.5-25 MCG/ACT AEPB Inhale 1 puff into the lungs daily. (Patient not taking: Reported on 02/05/2024) 14 each 0   predniSONE  (DELTASONE ) 20 MG tablet Take 1 tablet (20 mg total) by mouth daily. (Patient not taking: Reported on 02/05/2024) 5 tablet 0   albuterol  (VENTOLIN  HFA) 108 (90 Base) MCG/ACT inhaler Inhale 2 puffs into the lungs every 6 (six) hours as needed for wheezing or shortness of breath. (Patient not taking: Reported on 02/05/2024) 18 g 0   No facility-administered medications prior to visit.       [1]  Current Outpatient Medications:    acidophilus (RISAQUAD) CAPS capsule, Take 1 capsule by mouth daily., Disp: , Rfl:    albuterol  (PROVENTIL ) (2.5 MG/3ML) 0.083% nebulizer solution, Take 3 mLs (2.5 mg total) by nebulization in the morning and at bedtime., Disp: 75 mL, Rfl: 12   albuterol  (VENTOLIN  HFA) 108 (90 Base) MCG/ACT inhaler, Inhale 2 puffs into the lungs every 6 (six) hours as needed for wheezing or shortness of breath., Disp: 8 g, Rfl: 2   aspirin EC 81 MG tablet, Take 81 mg by mouth daily., Disp: , Rfl:    cefpodoxime  (VANTIN ) 200 MG tablet, Take 1 tablet (200 mg total) by mouth 2 (two) times daily for 7 days., Disp: 14 tablet, Rfl: 0   Coenzyme Q10 (COQ10 PO), Take by mouth daily., Disp: , Rfl:    Cyanocobalamin  (VITAMIN B-12) 1000 MCG SUBL, Place 1,000 mcg under the tongue daily., Disp: , Rfl:    ferrous sulfate 325 (65 FE) MG EC tablet, Take 325 mg by mouth daily with breakfast., Disp: , Rfl:    Fluticasone -Umeclidin-Vilant (TRELEGY ELLIPTA ) 200-62.5-25 MCG/ACT AEPB, Inhale 1 puff into the lungs daily., Disp: 3 each, Rfl: 6   latanoprost (XALATAN) 0.005 % ophthalmic solution, Place 1 drop into the left eye., Disp: , Rfl: 3   magnesium gluconate (MAGONATE) 500 MG tablet, Take 1,000 mg by mouth daily., Disp: , Rfl:    Multiple Vitamin (MULTIVITAMIN) tablet, Take 1 tablet by mouth daily., Disp: , Rfl:    POTASSIUM PO, Take by mouth., Disp: , Rfl:    pravastatin  (PRAVACHOL ) 20 MG tablet, Take 1 tablet (20 mg total) by mouth daily., Disp: 90 tablet, Rfl: 3   sodium chloride  HYPERTONIC 3 % nebulizer solution, Take by nebulization in the morning and at bedtime., Disp: 750 mL, Rfl: 6   budesonide  (PULMICORT ) 0.25 MG/2ML nebulizer solution, Take 2 mLs (0.25 mg total) by nebulization daily as needed. (Patient not taking: Reported on 02/05/2024), Disp: 60 mL, Rfl: 12   Fluticasone -Umeclidin-Vilant (TRELEGY ELLIPTA ) 200-62.5-25 MCG/ACT AEPB, Inhale 1 puff into  the lungs daily.  (Patient not taking: Reported on 02/05/2024), Disp: 14 each, Rfl: 0   predniSONE  (DELTASONE ) 20 MG tablet, Take 1 tablet (20 mg total) by mouth daily. (Patient not taking: Reported on 02/05/2024), Disp: 5 tablet, Rfl: 0 [2]  Allergies Allergen Reactions   Codeine Nausea And Vomiting   "

## 2024-02-07 NOTE — Telephone Encounter (Signed)
 For the codes 68372, [id number], [id number], [id number], [id number], [id number], [phone] Prior Auth Not Required Refer # [id number] 02/07/2024 [facility]

## 2024-02-07 NOTE — Telephone Encounter (Signed)
 Noted. Nothing further needed.

## 2024-02-12 ENCOUNTER — Ambulatory Visit: Admitting: Internal Medicine

## 2024-02-15 ENCOUNTER — Encounter
Admission: RE | Admit: 2024-02-15 | Discharge: 2024-02-15 | Disposition: A | Source: Ambulatory Visit | Attending: Student in an Organized Health Care Education/Training Program

## 2024-02-15 ENCOUNTER — Encounter: Payer: Self-pay | Admitting: Urgent Care

## 2024-02-15 ENCOUNTER — Encounter: Payer: Self-pay | Admitting: Student in an Organized Health Care Education/Training Program

## 2024-02-15 DIAGNOSIS — Z01818 Encounter for other preprocedural examination: Secondary | ICD-10-CM | POA: Diagnosis present

## 2024-02-15 DIAGNOSIS — R911 Solitary pulmonary nodule: Secondary | ICD-10-CM | POA: Insufficient documentation

## 2024-02-15 DIAGNOSIS — I720 Aneurysm of carotid artery: Secondary | ICD-10-CM

## 2024-02-15 DIAGNOSIS — Z01812 Encounter for preprocedural laboratory examination: Secondary | ICD-10-CM

## 2024-02-15 HISTORY — DX: Supraventricular tachycardia, unspecified: I47.10

## 2024-02-15 HISTORY — DX: Pneumonia, unspecified organism: J18.9

## 2024-02-15 HISTORY — DX: Other specified postprocedural states: Z98.890

## 2024-02-15 HISTORY — DX: Aneurysm of carotid artery: I72.0

## 2024-02-15 LAB — CBC
HCT: 37.2 % (ref 36.0–46.0)
Hemoglobin: 12.6 g/dL (ref 12.0–15.0)
MCH: 29.3 pg (ref 26.0–34.0)
MCHC: 33.9 g/dL (ref 30.0–36.0)
MCV: 86.5 fL (ref 80.0–100.0)
Platelets: 221 10*3/uL (ref 150–400)
RBC: 4.3 MIL/uL (ref 3.87–5.11)
RDW: 14.3 % (ref 11.5–15.5)
WBC: 6.5 10*3/uL (ref 4.0–10.5)
nRBC: 0 % (ref 0.0–0.2)

## 2024-02-15 LAB — BASIC METABOLIC PANEL WITH GFR
Anion gap: 11 (ref 5–15)
BUN: 24 mg/dL — ABNORMAL HIGH (ref 8–23)
CO2: 26 mmol/L (ref 22–32)
Calcium: 10 mg/dL (ref 8.9–10.3)
Chloride: 102 mmol/L (ref 98–111)
Creatinine, Ser: 0.89 mg/dL (ref 0.44–1.00)
GFR, Estimated: >60 mL/min
Glucose, Bld: 98 mg/dL (ref 70–99)
Potassium: 4.2 mmol/L (ref 3.5–5.1)
Sodium: 138 mmol/L (ref 135–145)

## 2024-02-15 NOTE — Patient Instructions (Addendum)
 "                                                    Your procedure is scheduled on: Feb 03/2024  Report to the Registration Desk on the 1st floor of the [facility]. To find out your arrival time, please call [phone] between 1PM - 3PM on:    Feb 02/2024  If your arrival time is 6:00 am, do not arrive before that time as the [facility] entrance doors do not open until 6:00 am.  REMEMBER: Instructions that are not followed completely may result in serious medical risk, up to and including death; or upon the discretion of your surgeon and anesthesiologist your surgery may need to be rescheduled.  Do not eat food after midnight the night before surgery.  No gum chewing or hard candies.   One week prior to surgery: Stop Anti-inflammatories (NSAIDS) such as Advil, Aleve, Ibuprofen, Motrin, Naproxen, Naprosyn and Aspirin based products such as Excedrin, Goody's Powder, BC Powder. Stop ANY OVER THE COUNTER supplements until after surgery.  You may however, continue to take Tylenol  if needed for pain up until the day of surgery.   Continue taking all of your other prescription medications up until the day of surgery.  ON THE DAY OF SURGERY ONLY TAKE THESE MEDICATIONS WITH SIPS OF WATER :  pravastatin  (PRAVACHOL )  Use inhalers on the day of surgery and bring albuterol  to the hospital.  No Alcohol for 24 hours before or after surgery.  No Smoking including e-cigarettes for 24 hours before surgery.  No chewable tobacco products for at least 6 hours before surgery.  No nicotine patches on the day of surgery.  Do not use any recreational drugs for at least a week (preferably 2 weeks) before your surgery.  Please be advised that the combination of cocaine and anesthesia may have negative outcomes, up to and including death. If you test positive for cocaine, your surgery will be cancelled.  On the morning of surgery brush your teeth with toothpaste and water , you may rinse your mouth  with mouthwash if you wish. Do not swallow any toothpaste or mouthwash.  You may shower on day of surgery.  Do not wear jewelry, make-up, hairpins, clips or nail polish.  Do not wear lotions, powders, or perfumes.   Do not shave body hair from the neck down 48 hours before surgery.  Contact lenses, hearing aids and dentures may not be worn into surgery.  Do not bring valuables to the hospital. [facility] is not responsible for any missing/lost belongings or valuables.    Bring your C-PAP to the hospital in case you may have to spend the night.   Notify your doctor if there is any change in your medical condition (cold, fever, infection).  Wear comfortable clothing (specific to your surgery type) to the hospital.  After surgery, you can help prevent lung complications by doing breathing exercises.  Take deep breaths and cough every 1-2 hours. Your doctor may order a device called an Incentive Spirometer to help you take deep breaths.   In case of increased patient census, it may be necessary for you, the patient, to continue your postoperative care in the Same Day Surgery department.  If you are being discharged the day of surgery, you will not be allowed to drive home.  You will need a responsible individual to drive you home and stay with you for 24 hours after surgery.    Please call the Pre-admissions Testing Dept. at [phone] if you have any questions about these instructions.  Surgery Visitation Policy:  Patients having surgery or a procedure may have two visitors.  Children under the age of 51 must have an adult with them who is not the patient.  Higher Education Careers Adviser to address health-related social needs:  [email or web address] "

## 2024-02-18 ENCOUNTER — Ambulatory Visit
Admission: RE | Admit: 2024-02-18 | Discharge: 2024-02-18 | Disposition: A | Source: Ambulatory Visit | Attending: Student in an Organized Health Care Education/Training Program

## 2024-02-18 DIAGNOSIS — R911 Solitary pulmonary nodule: Secondary | ICD-10-CM

## 2024-02-19 ENCOUNTER — Ambulatory Visit

## 2024-02-19 ENCOUNTER — Ambulatory Visit: Payer: Self-pay | Admitting: Urgent Care

## 2024-02-19 ENCOUNTER — Ambulatory Visit
Admission: RE | Admit: 2024-02-19 | Discharge: 2024-02-19 | Disposition: A | Attending: Student in an Organized Health Care Education/Training Program | Admitting: Student in an Organized Health Care Education/Training Program

## 2024-02-19 ENCOUNTER — Other Ambulatory Visit: Payer: Self-pay

## 2024-02-19 ENCOUNTER — Encounter
Admission: RE | Disposition: A | Discharge status: Home / Self Care | Payer: Self-pay | Source: Home / Self Care | Attending: Student in an Organized Health Care Education/Training Program

## 2024-02-19 ENCOUNTER — Encounter: Payer: Self-pay | Admitting: Student in an Organized Health Care Education/Training Program

## 2024-02-19 DIAGNOSIS — R911 Solitary pulmonary nodule: Secondary | ICD-10-CM | POA: Insufficient documentation

## 2024-02-19 DIAGNOSIS — J479 Bronchiectasis, uncomplicated: Secondary | ICD-10-CM | POA: Insufficient documentation

## 2024-02-19 DIAGNOSIS — Z79899 Other long term (current) drug therapy: Secondary | ICD-10-CM | POA: Insufficient documentation

## 2024-02-19 DIAGNOSIS — Z87891 Personal history of nicotine dependence: Secondary | ICD-10-CM | POA: Insufficient documentation

## 2024-02-19 DIAGNOSIS — Z8701 Personal history of pneumonia (recurrent): Secondary | ICD-10-CM | POA: Insufficient documentation

## 2024-02-19 DIAGNOSIS — R053 Chronic cough: Secondary | ICD-10-CM

## 2024-02-19 DIAGNOSIS — Z825 Family history of asthma and other chronic lower respiratory diseases: Secondary | ICD-10-CM | POA: Insufficient documentation

## 2024-02-19 DIAGNOSIS — Z7982 Long term (current) use of aspirin: Secondary | ICD-10-CM | POA: Insufficient documentation

## 2024-02-19 HISTORY — DX: Bronchiectasis, uncomplicated: J47.9

## 2024-02-19 HISTORY — DX: Solitary pulmonary nodule: R91.1

## 2024-02-19 LAB — BODY FLUID CELL COUNT WITH DIFFERENTIAL
Eos, Fluid: 1 %
Eos, Fluid: 2 %
Lymphs, Fluid: 7 %
Lymphs, Fluid: 9 %
Monocyte-Macrophage-Serous Fluid: 13 % — ABNORMAL LOW (ref 50–90)
Monocyte-Macrophage-Serous Fluid: 5 % — ABNORMAL LOW (ref 50–90)
Neutrophil Count, Fluid: 79 % — ABNORMAL HIGH (ref 0–25)
Neutrophil Count, Fluid: 84 % — ABNORMAL HIGH (ref 0–25)
Total Nucleated Cell Count, Fluid: 116 uL (ref 0–1000)
Total Nucleated Cell Count, Fluid: 87 uL (ref 0–1000)

## 2024-02-19 MED ORDER — ACETAMINOPHEN 10 MG/ML IV SOLN: 1000.0000 mg | Freq: Once | INTRAVENOUS | Status: DC | PRN

## 2024-02-19 MED ORDER — CHLORHEXIDINE GLUCONATE 0.12 % MT SOLN: 15.0000 mL | Freq: Once | OROMUCOSAL | Status: DC

## 2024-02-19 MED ORDER — SUGAMMADEX SODIUM 200 MG/2ML IV SOLN
INTRAVENOUS | Status: DC | PRN
  Administered 2024-02-19: 200 mg via INTRAVENOUS

## 2024-02-19 MED ORDER — FENTANYL CITRATE (PF) 100 MCG/2ML IJ SOLN
INTRAMUSCULAR | Status: AC
  Filled 2024-02-19: qty 2

## 2024-02-19 MED ORDER — ROCURONIUM BROMIDE 100 MG/10ML IV SOLN
INTRAVENOUS | Status: DC | PRN
  Administered 2024-02-19: 50 mg via INTRAVENOUS

## 2024-02-19 MED ORDER — PROPOFOL 1000 MG/100ML IV EMUL
INTRAVENOUS | Status: AC
  Filled 2024-02-19: qty 100

## 2024-02-19 MED ORDER — FENTANYL CITRATE (PF) 100 MCG/2ML IJ SOLN: 25.0000 ug | INTRAMUSCULAR | Status: DC | PRN

## 2024-02-19 MED ORDER — LIDOCAINE HCL (CARDIAC) PF 100 MG/5ML IV SOSY
PREFILLED_SYRINGE | INTRAVENOUS | Status: DC | PRN
  Administered 2024-02-19: 100 mg via INTRAVENOUS

## 2024-02-19 MED ORDER — OXYCODONE HCL 5 MG/5ML PO SOLN: 5.0000 mg | Freq: Once | ORAL | Status: DC | PRN

## 2024-02-19 MED ORDER — LACTATED RINGERS IV SOLN: INTRAVENOUS | Status: DC

## 2024-02-19 MED ORDER — PHENYLEPHRINE 80 MCG/ML (10ML) SYRINGE FOR IV PUSH (FOR BLOOD PRESSURE SUPPORT)
PREFILLED_SYRINGE | INTRAVENOUS | Status: DC | PRN
  Administered 2024-02-19 (×2): 80 ug via INTRAVENOUS

## 2024-02-19 MED ORDER — DEXAMETHASONE SOD PHOSPHATE PF 10 MG/ML IJ SOLN
INTRAMUSCULAR | Status: DC | PRN
  Administered 2024-02-19: 5 mg via INTRAVENOUS

## 2024-02-19 MED ORDER — PROPOFOL 10 MG/ML IV BOLUS
INTRAVENOUS | Status: DC | PRN
  Administered 2024-02-19: 125 ug/kg/min via INTRAVENOUS
  Administered 2024-02-19: 150 mg via INTRAVENOUS

## 2024-02-19 MED ORDER — ONDANSETRON HCL 4 MG/2ML IJ SOLN: 4.0000 mg | Freq: Once | INTRAMUSCULAR | Status: DC | PRN

## 2024-02-19 MED ORDER — CHLORHEXIDINE GLUCONATE 0.12 % MT SOLN
OROMUCOSAL | Status: AC
  Filled 2024-02-19: qty 15

## 2024-02-19 MED ORDER — PROPOFOL 10 MG/ML IV BOLUS
INTRAVENOUS | Status: AC
  Filled 2024-02-19: qty 20

## 2024-02-19 MED ORDER — ORAL CARE MOUTH RINSE: 15.0000 mL | Freq: Once | OROMUCOSAL | Status: DC

## 2024-02-19 MED ORDER — OXYCODONE HCL 5 MG PO TABS: 5.0000 mg | ORAL_TABLET | Freq: Once | ORAL | Status: DC | PRN

## 2024-02-19 MED ORDER — ROCURONIUM BROMIDE 10 MG/ML (PF) SYRINGE
PREFILLED_SYRINGE | INTRAVENOUS | Status: AC
  Filled 2024-02-19: qty 10

## 2024-02-19 MED ORDER — CHLORHEXIDINE GLUCONATE 0.12 % MT SOLN
15.0000 mL | Freq: Once | OROMUCOSAL | Status: AC
  Administered 2024-02-19: 15 mL via OROMUCOSAL

## 2024-02-19 MED ORDER — ORAL CARE MOUTH RINSE: 15.0000 mL | Freq: Once | OROMUCOSAL | Status: AC

## 2024-02-19 MED ORDER — LIDOCAINE HCL (PF) 2 % IJ SOLN
INTRAMUSCULAR | Status: AC
  Filled 2024-02-19: qty 5

## 2024-02-19 MED ORDER — ONDANSETRON HCL 4 MG/2ML IJ SOLN
INTRAMUSCULAR | Status: DC | PRN
  Administered 2024-02-19: 4 mg via INTRAVENOUS

## 2024-02-19 MED ORDER — FENTANYL CITRATE (PF) 100 MCG/2ML IJ SOLN
INTRAMUSCULAR | Status: DC | PRN
  Administered 2024-02-19: 100 ug via INTRAVENOUS

## 2024-02-19 NOTE — Anesthesia Preprocedure Evaluation (Signed)
"                                    Anesthesia Evaluation  Patient identified by MRN, date of birth, ID band Patient awake    Reviewed: Allergy & Precautions, H&P , NPO status , Patient's Chart, lab work & pertinent test results  Airway Mallampati: II  TM Distance: >3 FB Neck ROM: Full    Dental no notable dental hx.    Pulmonary neg pulmonary ROS, former smoker   Pulmonary exam normal breath sounds clear to auscultation       Cardiovascular negative cardio ROS Normal cardiovascular exam Rhythm:Regular Rate:Normal     Neuro/Psych negative neurological ROS  negative psych ROS   GI/Hepatic negative GI ROS, Neg liver ROS,,,  Endo/Other  negative endocrine ROS    Renal/GU negative Renal ROS  negative genitourinary   Musculoskeletal negative musculoskeletal ROS (+)    Abdominal   Peds negative pediatric ROS (+)  Hematology negative hematology ROS (+)   Anesthesia Other Findings   Reproductive/Obstetrics negative OB ROS                              Anesthesia Physical Anesthesia Plan  ASA: 3  Anesthesia Plan: General   Post-op Pain Management:    Induction: Intravenous  PONV Risk Score and Plan: 2 and Ondansetron   Airway Management Planned: Oral ETT  Additional Equipment:   Intra-op Plan:   Post-operative Plan: Extubation in OR  Informed Consent: I have reviewed the patients History and Physical, chart, labs and discussed the procedure including the risks, benefits and alternatives for the proposed anesthesia with the patient or authorized representative who has indicated his/her understanding and acceptance.     Dental advisory given  Plan Discussed with: CRNA  Anesthesia Plan Comments:         Anesthesia Quick Evaluation  "

## 2024-02-19 NOTE — Transfer of Care (Signed)
 Immediate Anesthesia Transfer of Care Note  Patient: Dawn Wolfe  Procedure(s) Performed: VIDEO BRONCHOSCOPY WITH ENDOBRONCHIAL NAVIGATION (Bilateral)  Patient Location: PACU  Anesthesia Type:General  Level of Consciousness: awake, alert , and oriented  Airway & Oxygen Therapy: Patient Spontanous Breathing and Patient connected to face mask oxygen  Post-op Assessment: Report given to RN and Post -op Vital signs reviewed and stable  Post vital signs: stable  Last Vitals:  Vitals Value Taken Time  BP 123/72 02/19/24 13:05  Temp 36.4 C 02/19/24 13:05  Pulse 88 02/19/24 13:08  Resp 15 02/19/24 13:08  SpO2 100 % 02/19/24 13:08  Vitals shown include unfiled device data.  Last Pain:  Vitals:   02/19/24 1017  TempSrc: Temporal  PainSc: 0-No pain         Complications: No notable events documented.

## 2024-02-19 NOTE — Discharge Instructions (Addendum)
 Can resume aspirin tomorrow

## 2024-02-20 ENCOUNTER — Encounter: Payer: Self-pay | Admitting: Student in an Organized Health Care Education/Training Program

## 2024-02-20 ENCOUNTER — Ambulatory Visit: Admitting: Student in an Organized Health Care Education/Training Program

## 2024-02-20 LAB — CYTOLOGY - NON PAP

## 2024-02-21 LAB — ACID FAST SMEAR (AFB, MYCOBACTERIA): Acid Fast Smear: NEGATIVE

## 2024-02-22 LAB — AEROBIC/ANAEROBIC CULTURE W GRAM STAIN (SURGICAL/DEEP WOUND)
Culture: NO GROWTH
Gram Stain: NONE SEEN

## 2024-02-22 LAB — CULTURE, FUNGUS WITHOUT SMEAR

## 2024-02-22 LAB — CULTURE, RESPIRATORY W GRAM STAIN
Culture: NO GROWTH
Culture: NO GROWTH
Gram Stain: NONE SEEN

## 2024-03-05 ENCOUNTER — Ambulatory Visit: Admitting: Student in an Organized Health Care Education/Training Program

## 2024-04-01 ENCOUNTER — Ambulatory Visit: Admitting: Internal Medicine

## 2024-04-23 ENCOUNTER — Encounter
